# Patient Record
Sex: Female | Born: 1935
Health system: Southern US, Community
[De-identification: ages and names within clinical notes are randomized; demographics above are authoritative.]

## PROBLEM LIST (undated history)

## (undated) DIAGNOSIS — E079 Disorder of thyroid, unspecified: Secondary | ICD-10-CM

## (undated) DIAGNOSIS — E7800 Pure hypercholesterolemia, unspecified: Secondary | ICD-10-CM

## (undated) DIAGNOSIS — R634 Abnormal weight loss: Secondary | ICD-10-CM

## (undated) DIAGNOSIS — M810 Age-related osteoporosis without current pathological fracture: Secondary | ICD-10-CM

## (undated) DIAGNOSIS — C801 Malignant (primary) neoplasm, unspecified: Secondary | ICD-10-CM

## (undated) DIAGNOSIS — L719 Rosacea, unspecified: Secondary | ICD-10-CM

## (undated) DIAGNOSIS — I10 Essential (primary) hypertension: Secondary | ICD-10-CM

## (undated) DIAGNOSIS — F419 Anxiety disorder, unspecified: Secondary | ICD-10-CM

## (undated) HISTORY — DX: Pure hypercholesterolemia, unspecified: E78.00

## (undated) HISTORY — PX: TONSILLECTOMY AND ADENOIDECTOMY: SUR1326

## (undated) HISTORY — DX: Essential (primary) hypertension: I10

## (undated) HISTORY — DX: Rosacea, unspecified: L71.9

## (undated) HISTORY — DX: Anxiety disorder, unspecified: F41.9

## (undated) HISTORY — DX: Age-related osteoporosis without current pathological fracture: M81.0

## (undated) HISTORY — DX: Disorder of thyroid, unspecified: E07.9

## (undated) HISTORY — DX: Abnormal weight loss: R63.4

---
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
## 1957-03-18 HISTORY — PX: CHOLECYSTECTOMY: SHX55

## 2001-03-18 HISTORY — PX: THYROIDECTOMY: SHX17

## 2005-01-09 ENCOUNTER — Encounter: Admission: RE | Admit: 2005-01-09 | Discharge: 2005-01-09 | Payer: Self-pay | Admitting: Family Medicine

## 2006-02-05 ENCOUNTER — Encounter: Admission: RE | Admit: 2006-02-05 | Discharge: 2006-02-05 | Payer: Self-pay | Admitting: Family Medicine

## 2006-02-14 ENCOUNTER — Encounter: Admission: RE | Admit: 2006-02-14 | Discharge: 2006-02-14 | Payer: Self-pay | Admitting: Family Medicine

## 2006-07-21 ENCOUNTER — Encounter: Admission: RE | Admit: 2006-07-21 | Discharge: 2006-07-21 | Payer: Self-pay | Admitting: Family Medicine

## 2007-02-10 ENCOUNTER — Encounter: Admission: RE | Admit: 2007-02-10 | Discharge: 2007-02-10 | Payer: Self-pay | Admitting: Family Medicine

## 2008-02-15 ENCOUNTER — Encounter: Admission: RE | Admit: 2008-02-15 | Discharge: 2008-02-15 | Payer: Self-pay | Admitting: Family Medicine

## 2009-02-15 ENCOUNTER — Encounter: Admission: RE | Admit: 2009-02-15 | Discharge: 2009-02-15 | Payer: Self-pay | Admitting: Family Medicine

## 2009-11-22 ENCOUNTER — Inpatient Hospital Stay (HOSPITAL_COMMUNITY): Admission: EM | Admit: 2009-11-22 | Discharge: 2009-11-24 | Payer: Self-pay | Admitting: Emergency Medicine

## 2009-11-23 ENCOUNTER — Encounter: Payer: Self-pay | Admitting: Cardiology

## 2009-11-23 ENCOUNTER — Encounter (INDEPENDENT_AMBULATORY_CARE_PROVIDER_SITE_OTHER): Payer: Self-pay | Admitting: Internal Medicine

## 2009-11-23 ENCOUNTER — Ambulatory Visit: Payer: Self-pay | Admitting: Surgery

## 2009-11-24 ENCOUNTER — Encounter (INDEPENDENT_AMBULATORY_CARE_PROVIDER_SITE_OTHER): Payer: Self-pay | Admitting: Internal Medicine

## 2010-02-16 ENCOUNTER — Encounter: Admission: RE | Admit: 2010-02-16 | Discharge: 2010-02-16 | Payer: Self-pay | Admitting: Family Medicine

## 2010-05-31 LAB — CBC
HCT: 34.4 % — ABNORMAL LOW (ref 36.0–46.0)
HCT: 41.9 % (ref 36.0–46.0)
Hemoglobin: 11.9 g/dL — ABNORMAL LOW (ref 12.0–15.0)
Hemoglobin: 14.4 g/dL (ref 12.0–15.0)
MCH: 32.9 pg (ref 26.0–34.0)
MCH: 32.9 pg (ref 26.0–34.0)
MCHC: 34.4 g/dL (ref 30.0–36.0)
MCHC: 34.6 g/dL (ref 30.0–36.0)
MCV: 95.2 fL (ref 78.0–100.0)
MCV: 95.7 fL (ref 78.0–100.0)
Platelets: 150 10*3/uL (ref 150–400)
Platelets: 215 10*3/uL (ref 150–400)
RBC: 3.61 MIL/uL — ABNORMAL LOW (ref 3.87–5.11)
RBC: 4.38 MIL/uL (ref 3.87–5.11)
RDW: 13.8 % (ref 11.5–15.5)
RDW: 14.4 % (ref 11.5–15.5)
WBC: 10.6 10*3/uL — ABNORMAL HIGH (ref 4.0–10.5)
WBC: 7.5 10*3/uL (ref 4.0–10.5)

## 2010-05-31 LAB — BASIC METABOLIC PANEL
BUN: 22 mg/dL (ref 6–23)
BUN: 27 mg/dL — ABNORMAL HIGH (ref 6–23)
CO2: 28 mEq/L (ref 19–32)
CO2: 31 mEq/L (ref 19–32)
Calcium: 7.7 mg/dL — ABNORMAL LOW (ref 8.4–10.5)
Calcium: 9.2 mg/dL (ref 8.4–10.5)
Chloride: 106 mEq/L (ref 96–112)
Chloride: 108 mEq/L (ref 96–112)
Creatinine, Ser: 1.25 mg/dL — ABNORMAL HIGH (ref 0.4–1.2)
Creatinine, Ser: 1.38 mg/dL — ABNORMAL HIGH (ref 0.4–1.2)
GFR calc Af Amer: 44 mL/min — ABNORMAL LOW (ref >60)
GFR calc Af Amer: 50 mL/min — ABNORMAL LOW (ref >60)
GFR calc non Af Amer: 37 mL/min — ABNORMAL LOW (ref >60)
GFR calc non Af Amer: 41 mL/min — ABNORMAL LOW (ref >60)
Glucose, Bld: 110 mg/dL — ABNORMAL HIGH (ref 70–99)
Glucose, Bld: 87 mg/dL (ref 70–99)
Potassium: 3.3 mEq/L — ABNORMAL LOW (ref 3.5–5.1)
Potassium: 3.6 mEq/L (ref 3.5–5.1)
Sodium: 141 mEq/L (ref 135–145)
Sodium: 145 mEq/L (ref 135–145)

## 2010-05-31 LAB — CARDIAC PANEL(CRET KIN+CKTOT+MB+TROPI)
CK, MB: 2.4 ng/mL (ref 0.3–4.0)
CK, MB: 2.4 ng/mL (ref 0.3–4.0)
CK, MB: 3.2 ng/mL (ref 0.3–4.0)
Relative Index: 2.3 (ref 0.0–2.5)
Relative Index: 2.3 (ref 0.0–2.5)
Relative Index: INVALID (ref 0.0–2.5)
Total CK: 103 U/L (ref 7–177)
Total CK: 141 U/L (ref 7–177)
Total CK: 99 U/L (ref 7–177)
Troponin I: 0.02 ng/mL (ref 0.00–0.06)
Troponin I: 0.02 ng/mL (ref 0.00–0.06)
Troponin I: 0.02 ng/mL (ref 0.00–0.06)

## 2010-05-31 LAB — LIPID PANEL
Cholesterol: 142 mg/dL (ref 0–200)
HDL: 45 mg/dL (ref >39)
LDL Cholesterol: 80 mg/dL (ref 0–99)
Total CHOL/HDL Ratio: 3.2 RATIO
Triglycerides: 86 mg/dL (ref <150)
VLDL: 17 mg/dL (ref 0–40)

## 2010-05-31 LAB — GLUCOSE, CAPILLARY: Glucose-Capillary: 144 mg/dL — ABNORMAL HIGH (ref 70–99)

## 2010-05-31 LAB — DIFFERENTIAL
Basophils Absolute: 0 10*3/uL (ref 0.0–0.1)
Basophils Relative: 0 % (ref 0–1)
Eosinophils Absolute: 0 10*3/uL (ref 0.0–0.7)
Eosinophils Relative: 1 % (ref 0–5)
Lymphocytes Relative: 11 % — ABNORMAL LOW (ref 12–46)
Lymphs Abs: 1.1 10*3/uL (ref 0.7–4.0)
Monocytes Absolute: 0.6 10*3/uL (ref 0.1–1.0)
Monocytes Relative: 6 % (ref 3–12)
Neutro Abs: 8.7 10*3/uL — ABNORMAL HIGH (ref 1.7–7.7)
Neutrophils Relative %: 83 % — ABNORMAL HIGH (ref 43–77)

## 2010-05-31 LAB — POCT CARDIAC MARKERS
CKMB, poc: 1.1 ng/mL (ref 1.0–8.0)
Myoglobin, poc: 165 ng/mL (ref 12–200)
Troponin i, poc: <0.05 ng/mL (ref 0.00–0.09)

## 2010-05-31 LAB — HEMOGLOBIN A1C
Hgb A1c MFr Bld: 5.7 % — ABNORMAL HIGH (ref <5.7)
Mean Plasma Glucose: 117 mg/dL — ABNORMAL HIGH (ref <117)

## 2011-01-22 ENCOUNTER — Other Ambulatory Visit: Payer: Self-pay | Admitting: Family Medicine

## 2011-01-22 DIAGNOSIS — Z1231 Encounter for screening mammogram for malignant neoplasm of breast: Secondary | ICD-10-CM

## 2011-02-19 ENCOUNTER — Ambulatory Visit
Admission: RE | Admit: 2011-02-19 | Discharge: 2011-02-19 | Disposition: A | Discharge status: Home / Self Care | Payer: Medicare Other | Source: Ambulatory Visit | Attending: Family Medicine | Admitting: Family Medicine

## 2011-02-19 DIAGNOSIS — Z1231 Encounter for screening mammogram for malignant neoplasm of breast: Secondary | ICD-10-CM

## 2012-01-14 ENCOUNTER — Other Ambulatory Visit: Payer: Self-pay | Admitting: Family Medicine

## 2012-01-14 DIAGNOSIS — Z1231 Encounter for screening mammogram for malignant neoplasm of breast: Secondary | ICD-10-CM

## 2012-02-20 ENCOUNTER — Ambulatory Visit
Admission: RE | Admit: 2012-02-20 | Discharge: 2012-02-20 | Disposition: A | Discharge status: Home / Self Care | Payer: Medicare Other | Source: Ambulatory Visit | Attending: Family Medicine | Admitting: Family Medicine

## 2012-02-20 DIAGNOSIS — Z1231 Encounter for screening mammogram for malignant neoplasm of breast: Secondary | ICD-10-CM

## 2013-01-20 ENCOUNTER — Other Ambulatory Visit: Payer: Self-pay

## 2013-01-20 DIAGNOSIS — Z1231 Encounter for screening mammogram for malignant neoplasm of breast: Secondary | ICD-10-CM

## 2013-02-22 ENCOUNTER — Ambulatory Visit: Payer: Medicare Other

## 2013-02-23 ENCOUNTER — Ambulatory Visit
Admission: RE | Admit: 2013-02-23 | Discharge: 2013-02-23 | Disposition: A | Discharge status: Home / Self Care | Payer: Medicare Other | Source: Ambulatory Visit

## 2013-02-23 DIAGNOSIS — Z1231 Encounter for screening mammogram for malignant neoplasm of breast: Secondary | ICD-10-CM

## 2013-07-22 ENCOUNTER — Other Ambulatory Visit: Payer: Self-pay | Admitting: Gastroenterology

## 2013-07-22 DIAGNOSIS — R109 Unspecified abdominal pain: Secondary | ICD-10-CM

## 2013-07-27 ENCOUNTER — Ambulatory Visit
Admission: RE | Admit: 2013-07-27 | Discharge: 2013-07-27 | Disposition: A | Discharge status: Home / Self Care | Payer: Medicare Other | Source: Ambulatory Visit | Attending: Gastroenterology | Admitting: Gastroenterology

## 2013-07-27 ENCOUNTER — Other Ambulatory Visit: Payer: Self-pay | Admitting: Gastroenterology

## 2013-07-27 DIAGNOSIS — R109 Unspecified abdominal pain: Secondary | ICD-10-CM

## 2013-07-27 MED ORDER — IOHEXOL 300 MG/ML  SOLN
100.0000 mL | Freq: Once | INTRAMUSCULAR | Status: AC | PRN
  Administered 2013-07-27: 100 mL via INTRAVENOUS

## 2013-11-26 ENCOUNTER — Encounter: Payer: Self-pay | Admitting: *Deleted

## 2014-01-18 ENCOUNTER — Other Ambulatory Visit: Payer: Self-pay

## 2014-01-18 DIAGNOSIS — Z1231 Encounter for screening mammogram for malignant neoplasm of breast: Secondary | ICD-10-CM

## 2014-02-12 ENCOUNTER — Emergency Department (HOSPITAL_COMMUNITY): Payer: Medicare Other

## 2014-02-12 ENCOUNTER — Emergency Department (HOSPITAL_COMMUNITY)
Admission: EM | Admit: 2014-02-12 | Discharge: 2014-02-12 | Disposition: A | Discharge status: Home / Self Care | Payer: Medicare Other | Attending: Emergency Medicine | Admitting: Emergency Medicine

## 2014-02-12 ENCOUNTER — Encounter (HOSPITAL_COMMUNITY): Payer: Self-pay | Admitting: *Deleted

## 2014-02-12 DIAGNOSIS — I129 Hypertensive chronic kidney disease with stage 1 through stage 4 chronic kidney disease, or unspecified chronic kidney disease: Secondary | ICD-10-CM | POA: Diagnosis not present

## 2014-02-12 DIAGNOSIS — R630 Anorexia: Secondary | ICD-10-CM | POA: Insufficient documentation

## 2014-02-12 DIAGNOSIS — M810 Age-related osteoporosis without current pathological fracture: Secondary | ICD-10-CM | POA: Diagnosis not present

## 2014-02-12 DIAGNOSIS — R110 Nausea: Secondary | ICD-10-CM | POA: Insufficient documentation

## 2014-02-12 DIAGNOSIS — Z872 Personal history of diseases of the skin and subcutaneous tissue: Secondary | ICD-10-CM | POA: Insufficient documentation

## 2014-02-12 DIAGNOSIS — R109 Unspecified abdominal pain: Secondary | ICD-10-CM | POA: Diagnosis present

## 2014-02-12 DIAGNOSIS — N189 Chronic kidney disease, unspecified: Secondary | ICD-10-CM | POA: Insufficient documentation

## 2014-02-12 DIAGNOSIS — R52 Pain, unspecified: Secondary | ICD-10-CM

## 2014-02-12 DIAGNOSIS — R531 Weakness: Secondary | ICD-10-CM | POA: Diagnosis not present

## 2014-02-12 DIAGNOSIS — E876 Hypokalemia: Secondary | ICD-10-CM | POA: Insufficient documentation

## 2014-02-12 DIAGNOSIS — Z79899 Other long term (current) drug therapy: Secondary | ICD-10-CM | POA: Diagnosis not present

## 2014-02-12 DIAGNOSIS — Z9049 Acquired absence of other specified parts of digestive tract: Secondary | ICD-10-CM | POA: Insufficient documentation

## 2014-02-12 DIAGNOSIS — E079 Disorder of thyroid, unspecified: Secondary | ICD-10-CM | POA: Diagnosis not present

## 2014-02-12 LAB — URINALYSIS, ROUTINE W REFLEX MICROSCOPIC
Bilirubin Urine: NEGATIVE
GLUCOSE, UA: NEGATIVE mg/dL
Ketones, ur: NEGATIVE mg/dL
LEUKOCYTES UA: NEGATIVE
Nitrite: NEGATIVE
Protein, ur: NEGATIVE mg/dL
SPECIFIC GRAVITY, URINE: 1.012 (ref 1.005–1.030)
Urobilinogen, UA: 0.2 mg/dL (ref 0.0–1.0)
pH: 5.5 (ref 5.0–8.0)

## 2014-02-12 LAB — CBC WITH DIFFERENTIAL/PLATELET
BASOS PCT: 0 % (ref 0–1)
Basophils Absolute: 0 10*3/uL (ref 0.0–0.1)
Eosinophils Absolute: 0 10*3/uL (ref 0.0–0.7)
Eosinophils Relative: 0 % (ref 0–5)
HEMATOCRIT: 41.8 % (ref 36.0–46.0)
Hemoglobin: 14.2 g/dL (ref 12.0–15.0)
LYMPHS PCT: 12 % (ref 12–46)
Lymphs Abs: 1.1 10*3/uL (ref 0.7–4.0)
MCH: 32.6 pg (ref 26.0–34.0)
MCHC: 34 g/dL (ref 30.0–36.0)
MCV: 96.1 fL (ref 78.0–100.0)
MONO ABS: 0.8 10*3/uL (ref 0.1–1.0)
Monocytes Relative: 8 % (ref 3–12)
NEUTROS ABS: 7.7 10*3/uL (ref 1.7–7.7)
NEUTROS PCT: 80 % — AB (ref 43–77)
PLATELETS: 165 10*3/uL (ref 150–400)
RBC: 4.35 MIL/uL (ref 3.87–5.11)
RDW: 13.6 % (ref 11.5–15.5)
WBC: 9.6 10*3/uL (ref 4.0–10.5)

## 2014-02-12 LAB — COMPREHENSIVE METABOLIC PANEL
ALBUMIN: 3.8 g/dL (ref 3.5–5.2)
ALK PHOS: 75 U/L (ref 39–117)
ALT: 13 U/L (ref 0–35)
AST: 26 U/L (ref 0–37)
Anion gap: 18 — ABNORMAL HIGH (ref 5–15)
BILIRUBIN TOTAL: 0.8 mg/dL (ref 0.3–1.2)
BUN: 21 mg/dL (ref 6–23)
CHLORIDE: 99 meq/L (ref 96–112)
CO2: 23 meq/L (ref 19–32)
Calcium: 8.7 mg/dL (ref 8.4–10.5)
Creatinine, Ser: 1.2 mg/dL — ABNORMAL HIGH (ref 0.50–1.10)
GFR calc Af Amer: 46 mL/min — ABNORMAL LOW (ref >90)
GFR, EST NON AFRICAN AMERICAN: 39 mL/min — AB (ref >90)
Glucose, Bld: 110 mg/dL — ABNORMAL HIGH (ref 70–99)
POTASSIUM: 3.5 meq/L — AB (ref 3.7–5.3)
SODIUM: 140 meq/L (ref 137–147)
Total Protein: 7.5 g/dL (ref 6.0–8.3)

## 2014-02-12 LAB — I-STAT TROPONIN, ED: Troponin i, poc: 0 ng/mL (ref 0.00–0.08)

## 2014-02-12 LAB — URINE MICROSCOPIC-ADD ON

## 2014-02-12 LAB — LIPASE, BLOOD: LIPASE: 40 U/L (ref 11–59)

## 2014-02-12 MED ORDER — IOHEXOL 300 MG/ML  SOLN
50.0000 mL | Freq: Once | INTRAMUSCULAR | Status: AC | PRN
  Administered 2014-02-12: 50 mL via ORAL

## 2014-02-12 MED ORDER — ONDANSETRON HCL 4 MG/2ML IJ SOLN: 4.0000 mg | Freq: Once | INTRAMUSCULAR | Status: DC

## 2014-02-12 MED ORDER — IOHEXOL 300 MG/ML  SOLN
80.0000 mL | Freq: Once | INTRAMUSCULAR | Status: AC | PRN
  Administered 2014-02-12: 80 mL via INTRAVENOUS

## 2014-02-12 MED ORDER — ONDANSETRON HCL 8 MG PO TABS: 8.0000 mg | ORAL_TABLET | Freq: Three times a day (TID) | ORAL | Status: DC | PRN

## 2014-02-12 MED ORDER — POTASSIUM CHLORIDE CRYS ER 20 MEQ PO TBCR
40.0000 meq | EXTENDED_RELEASE_TABLET | Freq: Once | ORAL | Status: AC
  Administered 2014-02-12: 40 meq via ORAL
  Filled 2014-02-12: qty 2

## 2014-02-12 MED ORDER — SODIUM CHLORIDE 0.9 % IV BOLUS (SEPSIS)
1000.0000 mL | Freq: Once | INTRAVENOUS | Status: AC
  Administered 2014-02-12: 1000 mL via INTRAVENOUS

## 2014-02-12 NOTE — ED Notes (Signed)
Pt ambulated to restroom with steady gait.

## 2014-02-12 NOTE — ED Notes (Signed)
Pt denies pain or nausea presently.

## 2014-02-12 NOTE — Discharge Instructions (Signed)
Abdominal Pain Take the medication prescribed as needed for nausea. Call Dr. Wynetta Emery to schedule next available office appointment. You can continue the omeprazole as prescribed. You can discuss with Dr. Laurann Montana whether or not you need to continue sertraline for anxiety or stress Many things can cause abdominal pain. Usually, abdominal pain is not caused by a disease and will improve without treatment. It can often be observed and treated at home. Your health care provider will do a physical exam and possibly order blood tests and X-rays to help determine the seriousness of your pain. However, in many cases, more time must pass before a clear cause of the pain can be found. Before that point, your health care provider may not know if you need more testing or further treatment. HOME CARE INSTRUCTIONS  Monitor your abdominal pain for any changes. The following actions may help to alleviate any discomfort you are experiencing:  Only take over-the-counter or prescription medicines as directed by your health care provider.  Do not take laxatives unless directed to do so by your health care provider.  Try a clear liquid diet (broth, tea, or water) as directed by your health care provider. Slowly move to a bland diet as tolerated. SEEK MEDICAL CARE IF:  You have unexplained abdominal pain.  You have abdominal pain associated with nausea or diarrhea.  You have pain when you urinate or have a bowel movement.  You experience abdominal pain that wakes you in the night.  You have abdominal pain that is worsened or improved by eating food.  You have abdominal pain that is worsened with eating fatty foods.  You have a fever. SEEK IMMEDIATE MEDICAL CARE IF:   Your pain does not go away within 2 hours.  You keep throwing up (vomiting).  Your pain is felt only in portions of the abdomen, such as the right side or the left lower portion of the  abdomen.  You pass bloody or black tarry stools. MAKE SURE YOU:  Understand these instructions.   Will watch your condition.   Will get help right away if you are not doing well or get worse.  Document Released: 12/12/2004 Document Revised: 03/09/2013 Document Reviewed: 11/11/2012 [facility] Patient Information 2015 [place], [place]. This information is not intended to replace advice given to you by your health care provider. Make sure you discuss any questions you have with your health care provider.

## 2014-02-12 NOTE — ED Notes (Signed)
Pt leaving with ALL belongings she arrived with. Patient was advised to follow up with GI and PCP.

## 2014-02-12 NOTE — ED Notes (Signed)
Pt aware of the need for a urine sample however, is unable to void at this time.

## 2014-02-12 NOTE — ED Notes (Addendum)
Pt reports that she has had intermittent nausea and centralized abdominal pain x 1 month. Pt had episode this am in which she took pepto bismal with relief. Was seen at PCP this past week and was prescribed Zoloft and Omeprazole.

## 2014-02-12 NOTE — ED Provider Notes (Addendum)
CSN: [id number]     Arrival date & time 02/12/14  [id number] History   First MD Initiated Contact with Patient 02/12/14 0747     Chief Complaint  Patient presents with  . Nausea  . Abdominal Pain     (Consider location/radiation/quality/duration/timing/severity/associated sxs/prior Treatment) HPI Complains of vague abdominal discomfort, and hypogastrium, nonradiating onset 1 month ago. Accompanying symptoms include mild generalized weakness, diminished appetite and nausea no fever. Last bowel movement was 5 days ago, diarrhea. No urinary symptoms. She saw her primary care physician Dr. Lady Deutscher this past week was prescribed sertraline and subsequently prescribed omeprazole over the telephone which she's taken, without relief. No fever no other associated symptoms symptoms are nonexertional she denies chest pain. She presently states "I don't feel too bad" Past Medical History  Diagnosis Date  . Thyroid disease   . Hypertension   . Osteoporosis   . Rosacea    hypercholesterolemia Past Surgical History  Procedure Laterality Date  . Thyroidectomy    . Cholecystectomy    . Tonsillectomy and adenoidectomy     Family History  Problem Relation Age of Onset  . Hypertension Mother    History  Substance Use Topics  . Smoking status: Never Smoker   . Smokeless tobacco: Not on file  . Alcohol Use: No   OB History    No data available     Review of Systems  HENT: Negative.   Respiratory: Negative.   Cardiovascular: Negative.   Gastrointestinal: Positive for nausea and abdominal pain.  Musculoskeletal: Negative.   Skin: Negative.   Neurological: Positive for weakness.  Psychiatric/Behavioral: Negative.   All other systems reviewed and are negative.     Allergies  Aspirin  Home Medications   Prior to Admission medications   Medication Sig Start Date End Date Taking? Authorizing Provider  calcium carbonate (TUMS - DOSED IN MG ELEMENTAL CALCIUM) 500 MG chewable tablet Chew 1  tablet by mouth daily.    Historical Provider, MD  levothyroxine (SYNTHROID, LEVOTHROID) 50 MCG tablet Take 50 mcg by mouth daily before breakfast.    Historical Provider, MD  levothyroxine (SYNTHROID, LEVOTHROID) 75 MCG tablet Take 75 mcg by mouth daily before breakfast.    Historical Provider, MD  losartan-hydrochlorothiazide (HYZAAR) 100-12.5 MG per tablet Take 1 tablet by mouth daily.    Historical Provider, MD  Multiple Vitamin (MULTIVITAMINS PO) Take by mouth.    Historical Provider, MD   BP 141/92 mmHg  Pulse 106  Temp(Src) 97.8 F (36.6 C) (Oral)  Resp 16  Ht 5\' 7"  (1.702 m)  Wt 125 lb (56.7 kg)  BMI 19.57 kg/m2  SpO2 95% Physical Exam  Constitutional: She appears well-developed and well-nourished.  HENT:  Head: Normocephalic and atraumatic.  Eyes: Conjunctivae are normal. Pupils are equal, round, and reactive to light.  Neck: Neck supple. No tracheal deviation present. No thyromegaly present.  Cardiovascular: Normal rate and regular rhythm.   No murmur heard. Pulmonary/Chest: Effort normal and breath sounds normal.  Abdominal: Soft. Bowel sounds are normal. She exhibits no distension. There is no tenderness.  Musculoskeletal: Normal range of motion. She exhibits no edema or tenderness.  Neurological: She is alert. Coordination normal.  Skin: Skin is warm and dry. No rash noted.  Psychiatric: She has a normal mood and affect.  Nursing note and vitals reviewed.   ED Course  Procedures (including critical care time) Labs Review Labs Reviewed  CBC WITH DIFFERENTIAL  COMPREHENSIVE METABOLIC PANEL  LIPASE, BLOOD  URINALYSIS, ROUTINE W REFLEX  MICROSCOPIC  I-STAT TROPOININ, ED    Imaging Review No results found.   EKG Interpretation None     11:40 PM patient asymptomatic. Results for orders placed or performed during the hospital encounter of 02/12/14  CBC with Differential  Result Value Ref Range   WBC 9.6 4.0 - 10.5 K/uL   RBC 4.35 3.87 - 5.11 MIL/uL    Hemoglobin 14.2 12.0 - 15.0 g/dL   HCT 41.8 36.0 - 46.0 %   MCV 96.1 78.0 - 100.0 fL   MCH 32.6 26.0 - 34.0 pg   MCHC 34.0 30.0 - 36.0 g/dL   RDW 13.6 11.5 - 15.5 %   Platelets 165 150 - 400 K/uL   Neutrophils Relative % 80 (H) 43 - 77 %   Neutro Abs 7.7 1.7 - 7.7 K/uL   Lymphocytes Relative 12 12 - 46 %   Lymphs Abs 1.1 0.7 - 4.0 K/uL   Monocytes Relative 8 3 - 12 %   Monocytes Absolute 0.8 0.1 - 1.0 K/uL   Eosinophils Relative 0 0 - 5 %   Eosinophils Absolute 0.0 0.0 - 0.7 K/uL   Basophils Relative 0 0 - 1 %   Basophils Absolute 0.0 0.0 - 0.1 K/uL  Comprehensive metabolic panel  Result Value Ref Range   Sodium 140 137 - 147 mEq/L   Potassium 3.5 (L) 3.7 - 5.3 mEq/L   Chloride 99 96 - 112 mEq/L   CO2 23 19 - 32 mEq/L   Glucose, Bld 110 (H) 70 - 99 mg/dL   BUN 21 6 - 23 mg/dL   Creatinine, Ser 1.20 (H) 0.50 - 1.10 mg/dL   Calcium 8.7 8.4 - 10.5 mg/dL   Total Protein 7.5 6.0 - 8.3 g/dL   Albumin 3.8 3.5 - 5.2 g/dL   AST 26 0 - 37 U/L   ALT 13 0 - 35 U/L   Alkaline Phosphatase 75 39 - 117 U/L   Total Bilirubin 0.8 0.3 - 1.2 mg/dL   GFR calc non Af Amer 39 (L) >90 mL/min   GFR calc Af Amer 46 (L) >90 mL/min   Anion gap 18 (H) 5 - 15  Lipase, blood  Result Value Ref Range   Lipase 40 11 - 59 U/L  Urinalysis, Routine w reflex microscopic  Result Value Ref Range   Color, Urine YELLOW YELLOW   APPearance CLOUDY (A) CLEAR   Specific Gravity, Urine 1.012 1.005 - 1.030   pH 5.5 5.0 - 8.0   Glucose, UA NEGATIVE NEGATIVE mg/dL   Hgb urine dipstick TRACE (A) NEGATIVE   Bilirubin Urine NEGATIVE NEGATIVE   Ketones, ur NEGATIVE NEGATIVE mg/dL   Protein, ur NEGATIVE NEGATIVE mg/dL   Urobilinogen, UA 0.2 0.0 - 1.0 mg/dL   Nitrite NEGATIVE NEGATIVE   Leukocytes, UA NEGATIVE NEGATIVE  Urine microscopic-add on  Result Value Ref Range   Squamous Epithelial / LPF RARE RARE   WBC, UA 0-2 <3 WBC/hpf   Casts HYALINE CASTS (A) NEGATIVE  I-stat troponin, ED (only if pt is 80 y.o. or  older & pain is above umbilicus) - do not order at [facility]  Result Value Ref Range   Troponin i, poc 0.00 0.00 - 0.08 ng/mL   Comment 3           Ct Abdomen Pelvis W Contrast  02/12/2014   CLINICAL DATA:  80 year old with mid abdominal pain. Nausea and decreased appetite for 1 month.  EXAM: CT ABDOMEN AND PELVIS WITH CONTRAST  TECHNIQUE: Multidetector  CT imaging of the abdomen and pelvis was performed using the standard protocol following bolus administration of intravenous contrast.  CONTRAST:  56mL OMNIPAQUE IOHEXOL 300 MG/ML SOLN, 5mL OMNIPAQUE IOHEXOL 300 MG/ML SOLN  COMPARISON:  CT 07/27/2013  FINDINGS: Lung bases are clear.  Negative for free air.  Post cholecystectomy with mild biliary dilatation. Biliary dilatation is unchanged. No acute abnormalities in the liver. The portal venous system is patent. Normal appearance of the a pancreas, spleen and adrenal glands. Evidence for bilateral renal cysts. Evidence for an extrarenal pelvis in both kidneys without hydronephrosis.  There is a stable oval-shaped low density structure in the right adnexa that measures up to 2.4 cm. Findings could represent peritoneal inclusion cyst. No evidence for free fluid. Normal appearance of the uterus and left adnexal tissue. Moderate distention of the urinary bladder.  The abdominal aorta is heavily calcified without aneurysm. No significant abdominal or pelvic lymphadenopathy. There is colonic diverticulosis but no evidence for acute colonic inflammation. Appendix has a normal appearance. No evidence for bowel dilatation or obstruction.  No acute bone abnormality.  Disc space loss at L4-L5.  IMPRESSION: No acute abnormality within the abdomen or pelvis.  Stable 2.4 cm low-density structure in the region of the right adnexa. Findings could represent an adnexal cyst or peritoneal inclusion cyst. Minimal change from the previous examination. This could be followed up in 1 year with an ultrasound to ensure stability.  Bilateral  renal cysts.   Electronically Signed   By: Markus Daft M.D.   On: 02/12/2014 10:30    MDM  Mild hypokalemia is chronic. Renal insufficiency is chronic Plan prescription Zofran. No obvious etiology of symptoms Symptoms appear to be functional plan follow-up with Dr. Laurann Montana or she can schedule appointment with Dr. Wynetta Emery, gastroenterologist whom she is seen in the past Diagnosis #1 nonspecific abdominal pain #2 hypokalemia #3 renal insufficiency Final diagnoses:  None        Orlie Dakin, MD 02/12/14 [place], MD 02/12/14 1220

## 2014-02-12 NOTE — ED Notes (Signed)
MD Jacubowitz at bedside.  

## 2014-02-12 NOTE — ED Notes (Signed)
Patient stated that she cant pee, will notify when ready. Wants fluids first.

## 2014-02-12 NOTE — ED Notes (Signed)
Pt transported to CT ?

## 2014-02-12 NOTE — ED Notes (Signed)
Pt sts for about a month she has been having abdominal discomfort and nausea. Pt sts also no appetite and not drinking a lot. Pt reports pain is in her mid abdomen

## 2014-02-12 NOTE — ED Notes (Signed)
MD at bedside. 

## 2014-02-20 ENCOUNTER — Encounter (HOSPITAL_COMMUNITY): Payer: Self-pay | Admitting: Emergency Medicine

## 2014-02-20 ENCOUNTER — Emergency Department (HOSPITAL_COMMUNITY)
Admission: EM | Admit: 2014-02-20 | Discharge: 2014-02-20 | Disposition: A | Discharge status: Home / Self Care | Payer: Medicare Other | Attending: Emergency Medicine | Admitting: Emergency Medicine

## 2014-02-20 DIAGNOSIS — R1013 Epigastric pain: Secondary | ICD-10-CM | POA: Insufficient documentation

## 2014-02-20 DIAGNOSIS — Z872 Personal history of diseases of the skin and subcutaneous tissue: Secondary | ICD-10-CM | POA: Diagnosis not present

## 2014-02-20 DIAGNOSIS — Z9049 Acquired absence of other specified parts of digestive tract: Secondary | ICD-10-CM | POA: Insufficient documentation

## 2014-02-20 DIAGNOSIS — Z79899 Other long term (current) drug therapy: Secondary | ICD-10-CM | POA: Insufficient documentation

## 2014-02-20 DIAGNOSIS — I10 Essential (primary) hypertension: Secondary | ICD-10-CM | POA: Diagnosis not present

## 2014-02-20 DIAGNOSIS — M810 Age-related osteoporosis without current pathological fracture: Secondary | ICD-10-CM | POA: Diagnosis not present

## 2014-02-20 DIAGNOSIS — E079 Disorder of thyroid, unspecified: Secondary | ICD-10-CM | POA: Diagnosis not present

## 2014-02-20 MED ORDER — ONDANSETRON HCL 4 MG/2ML IJ SOLN
4.0000 mg | Freq: Once | INTRAMUSCULAR | Status: DC
  Filled 2014-02-20: qty 2

## 2014-02-20 MED ORDER — FAMOTIDINE IN NACL 20-0.9 MG/50ML-% IV SOLN
20.0000 mg | Freq: Once | INTRAVENOUS | Status: DC
  Filled 2014-02-20: qty 50

## 2014-02-20 MED ORDER — SODIUM CHLORIDE 0.9 % IV SOLN: Freq: Once | INTRAVENOUS | Status: DC

## 2014-02-20 MED ORDER — RANITIDINE HCL 150 MG PO TABS: 150.0000 mg | ORAL_TABLET | Freq: Two times a day (BID) | ORAL | Status: DC

## 2014-02-20 MED ORDER — SUCRALFATE 1 GM/10ML PO SUSP: 1.0000 g | Freq: Three times a day (TID) | ORAL | Status: DC

## 2014-02-20 MED ORDER — DICYCLOMINE HCL 20 MG PO TABS: 20.0000 mg | ORAL_TABLET | Freq: Two times a day (BID) | ORAL | Status: DC

## 2014-02-20 MED ORDER — GI COCKTAIL ~~LOC~~
30.0000 mL | Freq: Once | ORAL | Status: DC
  Filled 2014-02-20: qty 30

## 2014-02-20 NOTE — ED Provider Notes (Signed)
CSN: [id number]     Arrival date & time 02/20/14  [id number] History   First MD Initiated Contact with Patient 02/20/14 [phone]     Chief Complaint  Patient presents with  . Abdominal Pain     (Consider location/radiation/quality/duration/timing/severity/associated sxs/prior Treatment) HPI Comments: Patient presents to the ER for further evaluation of epigastric discomfort. Patient reports that the symptoms began approximately a month ago. She was seen in the ER a week ago and had a workup. She was supposed to follow-up with gastroenterology. She had an appointment on Thursday, but felt better and canceled the appointment. Yesterday she reports significant worsening of the pain. She has had nausea and can't eat. There has not been any vomiting. No rectal bleeding or melena.  Patient reports that she has not feeling uncomfortable today. She took the omeprazole and seems to have improved significantly. No discomfort currently. Daughter asks "can't you just get her admitted to get this taken care of?"  Patient is a 80 y.o. female presenting with abdominal pain.  Abdominal Pain Associated symptoms: nausea   Associated symptoms: no diarrhea and no vomiting     Past Medical History  Diagnosis Date  . Thyroid disease   . Hypertension   . Osteoporosis   . Rosacea    Past Surgical History  Procedure Laterality Date  . Thyroidectomy    . Cholecystectomy    . Tonsillectomy and adenoidectomy     Family History  Problem Relation Age of Onset  . Hypertension Mother    History  Substance Use Topics  . Smoking status: Never Smoker   . Smokeless tobacco: Not on file  . Alcohol Use: No   OB History    No data available     Review of Systems  Gastrointestinal: Positive for nausea and abdominal pain. Negative for vomiting and diarrhea.  All other systems reviewed and are negative.     Allergies  Aspirin  Home Medications   Prior to Admission medications   Medication Sig Start Date End  Date Taking? Authorizing Provider  ALPRAZolam (XANAX) 0.25 MG tablet Take 0.25 mg by mouth 2 (two) times daily as needed for anxiety.  02/14/14   Historical Provider, MD  atorvastatin (LIPITOR) 10 MG tablet Take 10 mg by mouth daily.    Historical Provider, MD  bismuth subsalicylate (PEPTO BISMOL) 262 MG/15ML suspension Take 30 mLs by mouth every 6 (six) hours as needed for indigestion or diarrhea or loose stools.    Historical Provider, MD  calcium carbonate (TUMS - DOSED IN MG ELEMENTAL CALCIUM) 500 MG chewable tablet Chew 1 tablet by mouth daily.    Historical Provider, MD  levothyroxine (SYNTHROID, LEVOTHROID) 50 MCG tablet Take 50 mcg by mouth daily before breakfast. Saturday and sunday    Historical Provider, MD  levothyroxine (SYNTHROID, LEVOTHROID) 75 MCG tablet Take 75 mcg by mouth daily before breakfast. Monday-friday    Historical Provider, MD  losartan-hydrochlorothiazide (HYZAAR) 100-12.5 MG per tablet Take 1 tablet by mouth daily.    Historical Provider, MD  Multiple Vitamin (MULTIVITAMIN WITH MINERALS) TABS tablet Take 1 tablet by mouth daily.    Historical Provider, MD  omeprazole (PRILOSEC) 20 MG capsule Take 20 mg by mouth daily.    Historical Provider, MD  ondansetron (ZOFRAN) 8 MG tablet Take 1 tablet (8 mg total) by mouth every 8 (eight) hours as needed for nausea. 02/12/14   Orlie Dakin, MD  sertraline (ZOLOFT) 25 MG tablet Take 25 mg by mouth daily.    Historical  Provider, MD   BP 154/80 mmHg  Pulse 100  Temp(Src) 98.5 F (36.9 C) (Oral)  Resp 18  SpO2 100% Physical Exam  Constitutional: She is oriented to person, place, and time. She appears well-developed and well-nourished. No distress.  HENT:  Head: Normocephalic and atraumatic.  Right Ear: Hearing normal.  Left Ear: Hearing normal.  Nose: Nose normal.  Mouth/Throat: Oropharynx is clear and moist and mucous membranes are normal.  Eyes: Conjunctivae and EOM are normal. Pupils are equal, round, and reactive to  light.  Neck: Normal range of motion. Neck supple.  Cardiovascular: Regular rhythm, S1 normal and S2 normal.  Exam reveals no gallop and no friction rub.   No murmur heard. Pulmonary/Chest: Effort normal and breath sounds normal. No respiratory distress. She exhibits no tenderness.  Abdominal: Soft. Normal appearance and bowel sounds are normal. There is no hepatosplenomegaly. There is no tenderness. There is no rebound, no guarding, no tenderness at McBurney's point and negative Murphy's sign. No hernia.  Musculoskeletal: Normal range of motion.  Neurological: She is alert and oriented to person, place, and time. She has normal strength. No cranial nerve deficit or sensory deficit. Coordination normal. GCS eye subscore is 4. GCS verbal subscore is 5. GCS motor subscore is 6.  Skin: Skin is warm, dry and intact. No rash noted. No cyanosis.  Psychiatric: She has a normal mood and affect. Her speech is normal and behavior is normal. Thought content normal.  Nursing note and vitals reviewed.   ED Course  Procedures (including critical care time) Labs Review Labs Reviewed  CBC WITH DIFFERENTIAL  COMPREHENSIVE METABOLIC PANEL  URINALYSIS, ROUTINE W REFLEX MICROSCOPIC  TROPONIN I  LIPASE, BLOOD    Imaging Review No results found.   EKG Interpretation None      MDM   Final diagnoses:  None   epigastric abdominal pain  Patient with ongoing issues of epigastric discomfort for approximately a month. Patient was seen in the ER week ago. I reviewed that visit. Patient had a thorough workup including CAT scan that did not show any acute abnormalities. She is not having any symptoms currently. She is here asking for admission to get further workup. Patient had appropriate outpatient follow-up scheduled for this past Thursday with her gastroenterologist, but canceled the appointment. I informed her that I would not be admitting her today unless I found any significant changes in her blood  work. Her examination is unremarkable. I did recommend repeat evaluation of her because she had some mild left-sided abnormalities that are chronic. After she found out that she was not going to be admitted or get her full workup here in the ER including GI consultation, she decided she did not want any workup performed. Patient will be discharged. Will prescribe ranitidine in addition to the omeprazole. Add Carafate. Continue Zofran as needed.    Orpah Greek, MD 02/20/14 [phone]

## 2014-02-20 NOTE — ED Notes (Signed)
Educated pt and daughter on Rx x3. Both verbalize understanding and agreement with plan of care to start Rxs and f/u with GI this week. Both appreciative of care and plan.

## 2014-02-20 NOTE — Discharge Instructions (Signed)

## 2014-02-20 NOTE — ED Notes (Signed)
Pt c/o mid abd pain since November.  Was just seen a few days ago for the same thing.  Wants to be admitted.  Dr. Betsey Holiday in room explaining treatment plan to follow up with GI.  States that she has was supposed to see her GI on Thursday but cancelled the appt because she was feeling better.

## 2014-02-24 ENCOUNTER — Ambulatory Visit: Payer: Medicare Other

## 2014-03-24 ENCOUNTER — Encounter: Payer: Self-pay | Admitting: Nurse Practitioner

## 2014-03-24 ENCOUNTER — Ambulatory Visit (INDEPENDENT_AMBULATORY_CARE_PROVIDER_SITE_OTHER): Payer: BLUE CROSS/BLUE SHIELD | Admitting: Nurse Practitioner

## 2014-03-24 VITALS — BP 112/66 | HR 89 | Temp 98.1°F | Resp 10 | Ht 65.0 in | Wt 110.0 lb

## 2014-03-24 DIAGNOSIS — I10 Essential (primary) hypertension: Secondary | ICD-10-CM

## 2014-03-24 DIAGNOSIS — E890 Postprocedural hypothyroidism: Secondary | ICD-10-CM

## 2014-03-24 DIAGNOSIS — R634 Abnormal weight loss: Secondary | ICD-10-CM

## 2014-03-24 DIAGNOSIS — F419 Anxiety disorder, unspecified: Secondary | ICD-10-CM

## 2014-03-24 DIAGNOSIS — E7800 Pure hypercholesterolemia, unspecified: Secondary | ICD-10-CM

## 2014-03-24 MED ORDER — MIRTAZAPINE 15 MG PO TABS: 15.0000 mg | ORAL_TABLET | Freq: Every day | ORAL | Status: DC

## 2014-03-24 NOTE — Progress Notes (Signed)
Patient ID: Lauren [name], female   DOB: Jan 05, 1925, 81 y.o.   MRN: [id number]    PCP: [name], NP  Allergies  Allergen Reactions  . Aspirin     Upset stomach     Chief Complaint  Patient presents with  . Establish Care    New patient establish care: Anxious (causing stomach issues), decreased appetitie, and mental concerns      HPI: Patient is a 81 y.o. female seen in the office today to establish care. pts daughter Juliann Pulse is here with pt today. Was not getting results at previous PCP, been to emergency room, gastroenterologist.  Previous PCP Dr [facility]- Dr Earle Gell   Pt with frequent nausea, gnawing feeling not eating Pt reports she feel likes it is nerves in her stomach, no appetite. No vomiting. Lives alone. Takes care of her own bills, cooks, still drives.  Had been volunteering at the hospital for 9-10 years and has stopped doing that due to anxiety  Went to to GI who decreased her zoloft due to nausea side effect. Has not noticed any changes in nausea.  In May she had increase stomach issues and had an endoscopy and it was normal.  Feels like her memory is not well but in the last few days her mind is clearer.  Has lost 16 lbs in 3 months.  Recently with adjustment in synthroid   Review of Systems:  Review of Systems  Constitutional: Positive for activity change and unexpected weight change. Negative for appetite change and fatigue.  HENT: Positive for hearing loss. Negative for congestion.   Eyes: Negative.   Respiratory: Negative for cough and shortness of breath.   Cardiovascular: Positive for palpitations (associated with anxiety). Negative for chest pain and leg swelling.  Gastrointestinal: Positive for nausea and abdominal pain (or discomfort). Negative for vomiting, diarrhea, constipation, blood in stool and abdominal distention.  Genitourinary: Negative for dysuria and difficulty urinating.  Musculoskeletal: Negative for myalgias and  arthralgias.  Skin: Negative for color change and wound.  Neurological: Negative for dizziness, tremors, seizures, weakness, light-headedness and headaches.  Psychiatric/Behavioral: Negative for behavioral problems, confusion and agitation. The patient is nervous/anxious.     Past Medical History  Diagnosis Date  . Thyroid disease   . Hypertension   . Osteoporosis   . Rosacea   . High cholesterol    Past Surgical History  Procedure Laterality Date  . Thyroidectomy  2003    [place]  . Cholecystectomy  1959    [place]  . Tonsillectomy and adenoidectomy     Social History:   reports that she has never smoked. She does not have any smokeless tobacco history on file. She reports that she does not drink alcohol or use illicit drugs.  Family History  Problem Relation Age of Onset  . Hypertension Mother   . Stroke Father   . Stroke Mother   . Lung cancer Daughter     Medications: Patient's Medications  New Prescriptions   No medications on file  Previous Medications   CALCIUM CARBONATE (TUMS EX) 750 MG CHEWABLE TABLET    Chew 1 tablet by mouth as needed for heartburn.   LEVOTHYROXINE (SYNTHROID, LEVOTHROID) 75 MCG TABLET    Take 75 mcg by mouth daily before breakfast.    LOSARTAN-HYDROCHLOROTHIAZIDE (HYZAAR) 100-12.5 MG PER TABLET    Take 1 tablet by mouth daily.   MULTIPLE VITAMIN (MULTIVITAMIN WITH MINERALS) TABS TABLET    Take 1 tablet by mouth daily.  OMEPRAZOLE (PRILOSEC) 20 MG CAPSULE    Take 20 mg by mouth daily.   SERTRALINE (ZOLOFT) 25 MG TABLET    Take 25 mg by mouth daily.  Modified Medications   No medications on file  Discontinued Medications   ATORVASTATIN (LIPITOR) 10 MG TABLET    Take 10 mg by mouth daily.   LEVOTHYROXINE (SYNTHROID, LEVOTHROID) 50 MCG TABLET    Take 50 mcg by mouth daily before breakfast. Saturday and sunday     Physical Exam:  Filed Vitals:   03/24/14 1331  BP: 112/66  Pulse: 89  Temp: 99.2 F (37.3 C)  TempSrc: Oral  Resp: 10    Height: 5\' 5"  (1.651 m)  Weight: 110 lb (49.896 kg)  SpO2: 98%    Physical Exam  Constitutional: She is oriented to person, place, and time. She appears well-developed and well-nourished. No distress.  HENT:  Head: Normocephalic and atraumatic.  Mouth/Throat: Oropharynx is clear and moist. No oropharyngeal exudate.  Eyes: Conjunctivae are normal. Pupils are equal, round, and reactive to light.  Neck: Normal range of motion. Neck supple.  Cardiovascular: Normal rate, regular rhythm and normal heart sounds.   Pulmonary/Chest: Effort normal and breath sounds normal.  Abdominal: Soft. Bowel sounds are normal.  Musculoskeletal: She exhibits no edema or tenderness.  Neurological: She is alert and oriented to person, place, and time.  Skin: Skin is warm and dry. She is not diaphoretic.  Psychiatric: She has a normal mood and affect.    Labs reviewed: Basic Metabolic Panel:  Recent Labs  02/12/14 0817  NA 140  K 3.5*  CL 99  CO2 23  GLUCOSE 110*  BUN 21  CREATININE 1.20*  CALCIUM 8.7   Liver Function Tests:  Recent Labs  02/12/14 0817  AST 26  ALT 13  ALKPHOS 75  BILITOT 0.8  PROT 7.5  ALBUMIN 3.8    Recent Labs  02/12/14 0817  LIPASE 40   No results for input(s): AMMONIA in the last 8760 hours. CBC:  Recent Labs  02/12/14 0817  WBC 9.6  NEUTROABS 7.7  HGB 14.2  HCT 41.8  MCV 96.1  PLT 165   Lipid Panel: No results for input(s): CHOL, HDL, LDLCALC, TRIG, CHOLHDL, LDLDIRECT in the last 8760 hours. TSH: No results for input(s): TSH in the last 8760 hours. A1C: Lab Results  Component Value Date   HGBA1C * 11/23/2009    5.7 (NOTE)                                                                       According to the ADA Clinical Practice Recommendations for 2011, when HbA1c is used as a screening test:   >=6.5%   Diagnostic of Diabetes Mellitus           (if abnormal result  is confirmed)  5.7-6.4%   Increased risk of developing Diabetes Mellitus   References:Diagnosis and Classification of Diabetes Mellitus,Diabetes [id number] 1):S62-S69 and Standards of Medical Care in         Diabetes - 2011,Diabetes [id number]  (Suppl 1):S11-S61.     Assessment/Plan 1. Loss of weight -with decreased appetite. -encouraged daughter and pt to get pt more involved in activities, make meals social, add supplement and to eat  3 meals a day -may liberalized diet.  - mirtazapine (REMERON) 15 MG tablet; Take 1 tablet (15 mg total) by mouth at bedtime. For appetite  Dispense: 30 tablet; Refill: 3 discussed side effects and to call if she does not tolerate.  2. Postoperative hypothyroidism -recently synthroid was adjusted by GI, will follow up TSH before next visit in 2 months  - TSH; Future  3. Essential hypertension -conts on losartan-hctz - CBC With differential/Platelet; Future - Comprehensive metabolic panel; Future  4. High cholesterol -was previously taking lipitor but due to all her GI complaints this was stopped.   5. Anxiety Pt feels like most of her issues are anxiety driven. Zoloft was decreased to 25 mg daily due to side effects of nausea. Has not noticed changed with this adjustment. May need to be increased back to 50 mg in the future.

## 2014-03-24 NOTE — Patient Instructions (Signed)
Eat 3 meals a day, supplements in between meals Liberalize diet Make plans for meals Plan 1-2 activities a week  Increase exercise can help with anxiety but make sure you are eating if you exercise  Will start remeron 15 mg at bedtime for appetite

## 2014-03-24 NOTE — Progress Notes (Signed)
Failed clock drawing  

## 2014-05-12 ENCOUNTER — Ambulatory Visit: Payer: BLUE CROSS/BLUE SHIELD

## 2014-05-16 ENCOUNTER — Ambulatory Visit
Admission: RE | Admit: 2014-05-16 | Discharge: 2014-05-16 | Disposition: A | Discharge status: Home / Self Care | Payer: BLUE CROSS/BLUE SHIELD | Source: Ambulatory Visit

## 2014-05-16 DIAGNOSIS — Z1231 Encounter for screening mammogram for malignant neoplasm of breast: Secondary | ICD-10-CM

## 2014-05-19 ENCOUNTER — Other Ambulatory Visit: Payer: BLUE CROSS/BLUE SHIELD

## 2014-05-19 DIAGNOSIS — E890 Postprocedural hypothyroidism: Secondary | ICD-10-CM

## 2014-05-19 DIAGNOSIS — I10 Essential (primary) hypertension: Secondary | ICD-10-CM

## 2014-05-20 LAB — CBC WITH DIFFERENTIAL
BASOS ABS: 0 10*3/uL (ref 0.0–0.2)
BASOS: 0 %
EOS: 2 %
Eosinophils Absolute: 0.2 10*3/uL (ref 0.0–0.4)
HCT: 39.3 % (ref 34.0–46.6)
Hemoglobin: 13.5 g/dL (ref 11.1–15.9)
IMMATURE GRANULOCYTES: 0 %
Immature Grans (Abs): 0 10*3/uL (ref 0.0–0.1)
LYMPHS: 28 %
Lymphocytes Absolute: 2.1 10*3/uL (ref 0.7–3.1)
MCH: 32.7 pg (ref 26.6–33.0)
MCHC: 34.4 g/dL (ref 31.5–35.7)
MCV: 95 fL (ref 79–97)
Monocytes Absolute: 0.7 10*3/uL (ref 0.1–0.9)
Monocytes: 10 %
NEUTROS PCT: 60 %
Neutrophils Absolute: 4.4 10*3/uL (ref 1.4–7.0)
RBC: 4.13 x10E6/uL (ref 3.77–5.28)
RDW: 13.8 % (ref 12.3–15.4)
WBC: 7.4 10*3/uL (ref 3.4–10.8)

## 2014-05-20 LAB — TSH: TSH: 1.53 u[IU]/mL (ref 0.450–4.500)

## 2014-05-20 LAB — COMPREHENSIVE METABOLIC PANEL
ALBUMIN: 4.2 g/dL (ref 3.5–4.7)
ALT: 30 IU/L (ref 0–32)
AST: 33 IU/L (ref 0–40)
Albumin/Globulin Ratio: 1.6 (ref 1.1–2.5)
Alkaline Phosphatase: 98 IU/L (ref 39–117)
BILIRUBIN TOTAL: 0.8 mg/dL (ref 0.0–1.2)
BUN / CREAT RATIO: 18 (ref 11–26)
BUN: 24 mg/dL (ref 8–27)
CO2: 24 mmol/L (ref 18–29)
Calcium: 8.6 mg/dL — ABNORMAL LOW (ref 8.7–10.3)
Chloride: 102 mmol/L (ref 97–108)
Creatinine, Ser: 1.35 mg/dL — ABNORMAL HIGH (ref 0.57–1.00)
GFR, EST AFRICAN AMERICAN: 40 mL/min/{1.73_m2} — AB (ref >59)
GFR, EST NON AFRICAN AMERICAN: 35 mL/min/{1.73_m2} — AB (ref >59)
GLUCOSE: 89 mg/dL (ref 65–99)
Globulin, Total: 2.6 g/dL (ref 1.5–4.5)
Potassium: 3.9 mmol/L (ref 3.5–5.2)
Sodium: 143 mmol/L (ref 134–144)
Total Protein: 6.8 g/dL (ref 6.0–8.5)

## 2014-05-23 ENCOUNTER — Ambulatory Visit (INDEPENDENT_AMBULATORY_CARE_PROVIDER_SITE_OTHER): Payer: BLUE CROSS/BLUE SHIELD | Admitting: Nurse Practitioner

## 2014-05-23 ENCOUNTER — Encounter: Payer: Self-pay | Admitting: Nurse Practitioner

## 2014-05-23 VITALS — BP 130/80 | HR 100 | Temp 98.8°F | Resp 18 | Ht 65.0 in | Wt 122.2 lb

## 2014-05-23 DIAGNOSIS — F419 Anxiety disorder, unspecified: Secondary | ICD-10-CM | POA: Diagnosis not present

## 2014-05-23 DIAGNOSIS — E890 Postprocedural hypothyroidism: Secondary | ICD-10-CM

## 2014-05-23 DIAGNOSIS — I10 Essential (primary) hypertension: Secondary | ICD-10-CM | POA: Diagnosis not present

## 2014-05-23 DIAGNOSIS — E7800 Pure hypercholesterolemia, unspecified: Secondary | ICD-10-CM

## 2014-05-23 DIAGNOSIS — R634 Abnormal weight loss: Secondary | ICD-10-CM

## 2014-05-23 MED ORDER — LOSARTAN POTASSIUM 100 MG PO TABS: 100.0000 mg | ORAL_TABLET | Freq: Every day | ORAL | Status: DC

## 2014-05-23 MED ORDER — MIRTAZAPINE 15 MG PO TABS: 15.0000 mg | ORAL_TABLET | Freq: Every day | ORAL | Status: DC

## 2014-05-23 MED ORDER — LEVOTHYROXINE SODIUM 75 MCG PO TABS: 75.0000 ug | ORAL_TABLET | Freq: Every day | ORAL | Status: DC

## 2014-05-23 NOTE — Progress Notes (Signed)
Patient ID: Lauren [name], female   DOB: 10/27/1924, 81 y.o.   MRN: [id number]    PCP: [name], NP  Allergies  Allergen Reactions  . Aspirin     Upset stomach     Chief Complaint  Patient presents with  . Medical Management of Chronic Issues     HPI: Patient is a 81 y.o. female seen in the office today to follow up. Pt with pmh of anorexia, anxiety, hypothyroid, htn. Pt reports she is back to volunteering at the [employer] twice a week. Eating better and her stomach is fine.  No problems with this at this time. Admits to not drinking enough water. Taking Remeron 15 mg qhs. Mood has improved as well. Also going back to church. Labs reviewed with pt  Review of Systems:  Review of Systems  Constitutional: Negative for activity change, appetite change, fatigue and unexpected weight change.       Weight gain since on remeron  HENT: Positive for hearing loss. Negative for congestion.   Eyes: Negative.   Respiratory: Negative for cough and shortness of breath.   Cardiovascular: Negative for chest pain, palpitations and leg swelling.  Gastrointestinal: Negative for abdominal pain, diarrhea, constipation, blood in stool and abdominal distention.  Genitourinary: Negative for dysuria and difficulty urinating.  Musculoskeletal: Negative for myalgias and arthralgias.  Skin: Negative for color change and wound.  Neurological: Negative for dizziness, tremors, seizures, weakness, light-headedness and headaches.  Psychiatric/Behavioral: Negative for behavioral problems, confusion and agitation. The patient is nervous/anxious (has improved).     Past Medical History  Diagnosis Date  . Thyroid disease   . Hypertension   . Osteoporosis   . Rosacea   . High cholesterol   . Anxiety   . Weight loss    Past Surgical History  Procedure Laterality Date  . Thyroidectomy  2003    [place]  . Cholecystectomy  1959    [place]  . Tonsillectomy and adenoidectomy     Social  History:   reports that she has never smoked. She does not have any smokeless tobacco history on file. She reports that she does not drink alcohol or use illicit drugs.  Family History  Problem Relation Age of Onset  . Hypertension Mother   . Stroke Mother   . Stroke Father   . Lung cancer Daughter     Medications: Patient's Medications  New Prescriptions   No medications on file  Previous Medications   CALCIUM CARBONATE (TUMS EX) 750 MG CHEWABLE TABLET    Chew 1 tablet by mouth as needed for heartburn.   LEVOTHYROXINE (SYNTHROID, LEVOTHROID) 75 MCG TABLET    Take 75 mcg by mouth daily before breakfast.    LOSARTAN-HYDROCHLOROTHIAZIDE (HYZAAR) 100-12.5 MG PER TABLET    Take 1 tablet by mouth daily.   MIRTAZAPINE (REMERON) 15 MG TABLET    Take 1 tablet (15 mg total) by mouth at bedtime. For appetite   MULTIPLE VITAMIN (MULTIVITAMIN WITH MINERALS) TABS TABLET    Take 1 tablet by mouth daily.  Modified Medications   No medications on file  Discontinued Medications   OMEPRAZOLE (PRILOSEC) 20 MG CAPSULE    Take 20 mg by mouth daily.   SERTRALINE (ZOLOFT) 25 MG TABLET    Take 25 mg by mouth daily.     Physical Exam:  Filed Vitals:   05/23/14 1309  BP: 130/80  Pulse: 100  Temp: 98.8 F (37.1 C)  TempSrc: Oral  Resp: 18  Height: 5\' 5"  (1.651 m)  Weight: 122 lb 3.2 oz (55.43 kg)  SpO2: 97%    Physical Exam  Constitutional: She is oriented to person, place, and time. She appears well-developed and well-nourished. No distress.  HENT:  Head: Normocephalic and atraumatic.  Mouth/Throat: Oropharynx is clear and moist. No oropharyngeal exudate.  Eyes: Conjunctivae are normal. Pupils are equal, round, and reactive to light.  Neck: Normal range of motion. Neck supple.  Cardiovascular: Normal rate, regular rhythm and normal heart sounds.   Pulmonary/Chest: Effort normal and breath sounds normal.  Abdominal: Soft. Bowel sounds are normal.  Musculoskeletal: She exhibits no edema or  tenderness.  Neurological: She is alert and oriented to person, place, and time.  Skin: Skin is warm and dry. She is not diaphoretic.  Psychiatric: She has a normal mood and affect.    Labs reviewed: Basic Metabolic Panel:  Recent Labs  02/12/14 0817 05/19/14 0847  NA 140 143  K 3.5* 3.9  CL 99 102  CO2 23 24  GLUCOSE 110* 89  BUN 21 24  CREATININE 1.20* 1.35*  CALCIUM 8.7 8.6*  TSH  --  1.530   Liver Function Tests:  Recent Labs  02/12/14 0817 05/19/14 0847  AST 26 33  ALT 13 30  ALKPHOS 75 98  BILITOT 0.8 0.8  PROT 7.5 6.8  ALBUMIN 3.8  --     Recent Labs  02/12/14 0817  LIPASE 40   No results for input(s): AMMONIA in the last 8760 hours. CBC:  Recent Labs  02/12/14 0817 05/19/14 0847  WBC 9.6 7.4  NEUTROABS 7.7 4.4  HGB 14.2 13.5  HCT 41.8 39.3  MCV 96.1 95  PLT 165  --    Lipid Panel: No results for input(s): CHOL, HDL, LDLCALC, TRIG, CHOLHDL, LDLDIRECT in the last 8760 hours. TSH:  Recent Labs  05/19/14 0847  TSH 1.530   A1C: Lab Results  Component Value Date   HGBA1C * 11/23/2009    5.7 (NOTE)                                                                       According to the ADA Clinical Practice Recommendations for 2011, when HbA1c is used as a screening test:   >=6.5%   Diagnostic of Diabetes Mellitus           (if abnormal result  is confirmed)  5.7-6.4%   Increased risk of developing Diabetes Mellitus  References:Diagnosis and Classification of Diabetes Mellitus,Diabetes [id number] 1):S62-S69 and Standards of Medical Care in         Diabetes - 2011,Diabetes [id number]  (Suppl 1):S11-S61.     Assessment/Plan  1. Loss of weight -appetite and stomach issues have significantly improved on Remeron, will cont this for now and monitor weight -has had positive weight gain in the last 2 months, was down from 125 lbs to 110lb prior to remeron, current weight at 122 lbs.  - mirtazapine (REMERON) 15 MG tablet; Take 1 tablet  (15 mg total) by mouth at bedtime. For appetite  Dispense: 90 tablet; Refill: 3  2. High cholesterol -was taken off statin due to stomach issues, will follow up lipid panel - Lipid panel; Future  3. Anxiety -has improved with increase  involvement in community.  -daughter also feels like Remeron is helping anxiety as well, will cont Remeron, apparently not taking Zoloft at this time.   4. Postoperative hypothyroidism -TSH reviewed with pt and daughter, cont synthroid 75 mcg - levothyroxine (SYNTHROID, LEVOTHROID) 75 MCG tablet; Take 1 tablet (75 mcg total) by mouth daily before breakfast.  Dispense: 90 tablet; Refill: 3  5. Essential hypertension -blood pressure at goal, however due to worsening kidney function with minimal water intake will DC hyzaar and start losartan only -pt to increase fluid intake - losartan (COZAAR) 100 MG tablet; Take 1 tablet (100 mg total) by mouth daily.  Dispense: 90 tablet; Refill: 3 - Basic metabolic panel; Future to follow up BUN/CR  Follow up in 6 weeks with Dr Mariea Clonts for weight loss, blood pressure and lab review

## 2014-05-23 NOTE — Patient Instructions (Signed)
Increase water intake Will STOP hyzaar once Losartan gets here Take blood pressure twice a week-- while sitting relaxed legs uncrossed for ~ 5 mins  Record blood pressure and Heart Rate and bring to next visit  Follow up With Dr Mariea Clonts in 6 weeks Fasting blood work prior to visit

## 2014-07-04 ENCOUNTER — Other Ambulatory Visit: Payer: Medicare Other

## 2014-07-04 DIAGNOSIS — E7800 Pure hypercholesterolemia, unspecified: Secondary | ICD-10-CM

## 2014-07-04 DIAGNOSIS — I10 Essential (primary) hypertension: Secondary | ICD-10-CM

## 2014-07-05 LAB — BASIC METABOLIC PANEL
BUN / CREAT RATIO: 22 (ref 11–26)
BUN: 27 mg/dL (ref 8–27)
CO2: 26 mmol/L (ref 18–29)
CREATININE: 1.25 mg/dL — AB (ref 0.57–1.00)
Calcium: 8.4 mg/dL — ABNORMAL LOW (ref 8.7–10.3)
Chloride: 104 mmol/L (ref 97–108)
GFR, EST AFRICAN AMERICAN: 44 mL/min/{1.73_m2} — AB (ref >59)
GFR, EST NON AFRICAN AMERICAN: 38 mL/min/{1.73_m2} — AB (ref >59)
Glucose: 89 mg/dL (ref 65–99)
Potassium: 3.7 mmol/L (ref 3.5–5.2)
Sodium: 145 mmol/L — ABNORMAL HIGH (ref 134–144)

## 2014-07-05 LAB — LIPID PANEL
CHOLESTEROL TOTAL: 232 mg/dL — AB (ref 100–199)
Chol/HDL Ratio: 4.5 ratio units — ABNORMAL HIGH (ref 0.0–4.4)
HDL: 51 mg/dL (ref >39)
LDL CALC: 155 mg/dL — AB (ref 0–99)
Triglycerides: 130 mg/dL (ref 0–149)
VLDL CHOLESTEROL CAL: 26 mg/dL (ref 5–40)

## 2014-07-07 ENCOUNTER — Other Ambulatory Visit: Payer: BLUE CROSS/BLUE SHIELD

## 2014-07-11 ENCOUNTER — Encounter: Payer: Self-pay | Admitting: Internal Medicine

## 2014-07-11 ENCOUNTER — Ambulatory Visit (INDEPENDENT_AMBULATORY_CARE_PROVIDER_SITE_OTHER): Payer: Medicare Other | Admitting: Internal Medicine

## 2014-07-11 VITALS — BP 150/90 | HR 106 | Temp 98.3°F | Resp 18 | Ht 65.0 in | Wt 123.4 lb

## 2014-07-11 DIAGNOSIS — E890 Postprocedural hypothyroidism: Secondary | ICD-10-CM | POA: Diagnosis not present

## 2014-07-11 DIAGNOSIS — I10 Essential (primary) hypertension: Secondary | ICD-10-CM | POA: Diagnosis not present

## 2014-07-11 DIAGNOSIS — F411 Generalized anxiety disorder: Secondary | ICD-10-CM

## 2014-07-11 DIAGNOSIS — R634 Abnormal weight loss: Secondary | ICD-10-CM | POA: Diagnosis not present

## 2014-07-11 NOTE — Patient Instructions (Signed)
Please bring us a copy of your living will and health care power of attorney.  

## 2014-07-11 NOTE — Progress Notes (Signed)
Patient ID: Lauren [name], female   DOB: January 06, 1925, 81 y.o.   MRN: [id number]   Location:  [facility]  Code Status: DNR Goals of Care: Advanced Directive information Does patient have an advance directive?: Yes, Type of Advance Directive: Living will;Healthcare Power of Attorney, Does patient want to make changes to advanced directive?: No - Patient declined   Allergies  Allergen Reactions  . Aspirin     Upset stomach     Chief Complaint  Patient presents with  . Medical Management of Chronic Issues    HPI: Patient is a 81 y.o. white female seen in the office today for med mgt of chronic diseases.  Pt not crazy about driving here.   Takes her remeron every night.  Eats two meals per day, not 3.  Doesn't do much activity to work up an appetite.  Weight has stabilized.   Worries if she doesn't have something to worry about.  Still does her volunteering.  Uneasy, anxious feeling.  Doesn't know what makes her feel that way.  Says no reason for it.  Sleeps well.  Says she is not depressed.   Remeron has leveled off her mood some per her daughter.   No pain.  Rare ibuprofen.   Tried to discuss therapy for her mood, but she refuses.   Is going to get out and walk--lives in condo.   Says she could use more friends.   MMSE - Mini Mental State Exam 03/24/2014  Orientation to time 5  Orientation to Place 3  Registration 3  Attention/ Calculation 5  Recall 2  Language- name 2 objects 2  Language- repeat 1  Language- follow 3 step command 3  Language- read & follow direction 1  Write a sentence 1  Copy design 1  Total score 27    Review of Systems:  Review of Systems  Constitutional:       Wt stable  Respiratory: Negative for shortness of breath.   Cardiovascular: Negative for chest pain and leg swelling.  Gastrointestinal: Negative for abdominal pain.  Genitourinary: Negative for dysuria.  Musculoskeletal: Negative for falls.    Neurological: Negative for dizziness.  Psychiatric/Behavioral: Negative for depression and memory loss. The patient is nervous/anxious and has insomnia.      Past Medical History  Diagnosis Date  . Thyroid disease   . Hypertension   . Osteoporosis   . Rosacea   . High cholesterol   . Anxiety   . Weight loss     Past Surgical History  Procedure Laterality Date  . Thyroidectomy  2003    [place]  . Cholecystectomy  1959    [place]  . Tonsillectomy and adenoidectomy      Social History:   reports that she has never smoked. She does not have any smokeless tobacco history on file. She reports that she does not drink alcohol or use illicit drugs.  Family History  Problem Relation Age of Onset  . Hypertension Mother   . Stroke Mother   . Stroke Father   . Lung cancer Daughter     Medications: Patient's Medications  New Prescriptions   No medications on file  Previous Medications   CALCIUM CARBONATE (TUMS EX) 750 MG CHEWABLE TABLET    Chew 1 tablet by mouth as needed for heartburn.   LEVOTHYROXINE (SYNTHROID, LEVOTHROID) 75 MCG TABLET    Take 1 tablet (75 mcg total) by mouth daily before breakfast.   LOSARTAN (COZAAR)  100 MG TABLET    Take 1 tablet (100 mg total) by mouth daily.   MIRTAZAPINE (REMERON) 15 MG TABLET    Take 1 tablet (15 mg total) by mouth at bedtime. For appetite   MULTIPLE VITAMIN (MULTIVITAMIN WITH MINERALS) TABS TABLET    Take 1 tablet by mouth daily.  Modified Medications   No medications on file  Discontinued Medications   No medications on file     Physical Exam: Filed Vitals:   07/11/14 1435  BP: 150/90  Pulse: 106  Temp: 98.3 F (36.8 C)  TempSrc: Oral  Resp: 18  Height: 5\' 5"  (1.651 m)  Weight: 123 lb 6.4 oz (55.974 kg)  SpO2: 97%  Physical Exam  Constitutional: She is oriented to person, place, and time. She appears well-developed and well-nourished. No distress.  Cardiovascular: Normal rate, regular rhythm, normal heart sounds and  intact distal pulses.   Pulmonary/Chest: Effort normal and breath sounds normal. No respiratory distress.  Musculoskeletal: Normal range of motion.  Neurological: She is alert and oriented to person, place, and time.  Skin: Skin is warm and dry.  Psychiatric: She has a normal mood and affect.  Does not appear overtly anxious, but says she is     Labs reviewed: Basic Metabolic Panel:  Recent Labs  02/12/14 0817 05/19/14 0847 07/04/14 0815  NA 140 143 145*  K 3.5* 3.9 3.7  CL 99 102 104  CO2 23 24 26   GLUCOSE 110* 89 89  BUN 21 24 27   CREATININE 1.20* 1.35* 1.25*  CALCIUM 8.7 8.6* 8.4*  TSH  --  1.530  --    Liver Function Tests:  Recent Labs  02/12/14 0817 05/19/14 0847  AST 26 33  ALT 13 30  ALKPHOS 75 98  BILITOT 0.8 0.8  PROT 7.5 6.8  ALBUMIN 3.8  --     Recent Labs  02/12/14 0817  LIPASE 40   No results for input(s): AMMONIA in the last 8760 hours. CBC:  Recent Labs  02/12/14 0817 05/19/14 0847  WBC 9.6 7.4  NEUTROABS 7.7 4.4  HGB 14.2 13.5  HCT 41.8 39.3  MCV 96.1 95  PLT 165  --    Lipid Panel:  Recent Labs  07/04/14 0815  CHOL 232*  HDL 51  LDLCALC 155*  TRIG 130  CHOLHDL 4.5*   Lab Results  Component Value Date   HGBA1C * 11/23/2009    5.7 (NOTE)                                                                       According to the ADA Clinical Practice Recommendations for 2011, when HbA1c is used as a screening test:   >=6.5%   Diagnostic of Diabetes Mellitus           (if abnormal result  is confirmed)  5.7-6.4%   Increased risk of developing Diabetes Mellitus  References:Diagnosis and Classification of Diabetes Mellitus,Diabetes [id number] 1):S62-S69 and Standards of Medical Care in         Diabetes - 2011,Diabetes Care,2011,34  (Suppl 1):S11-S61.     Assessment/Plan 1. Anxiety state -we opted to just continue her on the remeron due to concerns that d/c would cause wt loss and more difficulty with  sleep and  mood -could consider adding on wellbutrin at future visit but she didn't want more meds  2. Essential hypertension -bp just above goal--I'd be happy with it under 150/90 and it has been at home so no changes needed  3. Postoperative hypothyroidism -cont current synthroid 78mcg  4. Loss of weight -tsh normal 2 mos ago, probably just not eating enough as is typical in frail elderly -discussed high protein/fat foods that will help her to gain weight  Lab Results  Component Value Date   TSH 1.530 05/19/2014    Labs/tests ordered:  No orders of the defined types were placed in this encounter.    Next appt:  3 mos with Janett Billow re: mood  Randilyn Foisy L. Kentavius Dettore, D.O. [facility] [address], [place] [address] Cell Phone (Mon-Fri 8am-5pm):  [phone] On Call:  [phone] & follow prompts after 5pm & weekends Office Phone:  [phone] Office Fax:  [phone]

## 2014-08-11 ENCOUNTER — Telehealth: Payer: Self-pay | Admitting: *Deleted

## 2014-08-11 MED ORDER — BUPROPION HCL ER (XL) 150 MG PO TB24: ORAL_TABLET | ORAL | Status: DC

## 2014-08-11 NOTE — Telephone Encounter (Signed)
Patient daughter notified and agreed.  

## 2014-08-11 NOTE — Telephone Encounter (Signed)
Patient daughter, [name] called and stated that patient wants the anxiety medication called into pharmacy now. Stated that it was discussed at last appointment. Please Advise.

## 2014-08-11 NOTE — Telephone Encounter (Signed)
Spoke with Dr Mariea Clonts who saw pt last, okay to start Wellbutrin XL 150mg  daily in AM To follow up in office in 1 month

## 2014-10-10 ENCOUNTER — Other Ambulatory Visit: Payer: Medicare Other

## 2014-10-10 DIAGNOSIS — I10 Essential (primary) hypertension: Secondary | ICD-10-CM

## 2014-10-11 LAB — COMPREHENSIVE METABOLIC PANEL
ALT: 13 IU/L (ref 0–32)
AST: 21 IU/L (ref 0–40)
Albumin/Globulin Ratio: 1.5 (ref 1.1–2.5)
Albumin: 4 g/dL (ref 3.5–4.7)
Alkaline Phosphatase: 73 IU/L (ref 39–117)
BUN/Creatinine Ratio: 14 (ref 11–26)
BUN: 16 mg/dL (ref 8–27)
Bilirubin Total: 0.8 mg/dL (ref 0.0–1.2)
CO2: 22 mmol/L (ref 18–29)
Calcium: 8 mg/dL — ABNORMAL LOW (ref 8.7–10.3)
Chloride: 100 mmol/L (ref 97–108)
Creatinine, Ser: 1.15 mg/dL — ABNORMAL HIGH (ref 0.57–1.00)
GFR calc Af Amer: 49 mL/min/{1.73_m2} — ABNORMAL LOW (ref >59)
GFR calc non Af Amer: 43 mL/min/{1.73_m2} — ABNORMAL LOW (ref >59)
Globulin, Total: 2.6 g/dL (ref 1.5–4.5)
Glucose: 92 mg/dL (ref 65–99)
POTASSIUM: 3.7 mmol/L (ref 3.5–5.2)
Sodium: 142 mmol/L (ref 134–144)
Total Protein: 6.6 g/dL (ref 6.0–8.5)

## 2014-10-13 ENCOUNTER — Encounter: Payer: Self-pay | Admitting: Nurse Practitioner

## 2014-10-13 ENCOUNTER — Ambulatory Visit (INDEPENDENT_AMBULATORY_CARE_PROVIDER_SITE_OTHER): Payer: Medicare Other | Admitting: Nurse Practitioner

## 2014-10-13 VITALS — BP 158/100 | HR 90 | Temp 98.3°F | Resp 18 | Ht 65.0 in | Wt 115.4 lb

## 2014-10-13 DIAGNOSIS — N183 Chronic kidney disease, stage 3 unspecified: Secondary | ICD-10-CM

## 2014-10-13 DIAGNOSIS — I10 Essential (primary) hypertension: Secondary | ICD-10-CM | POA: Diagnosis not present

## 2014-10-13 DIAGNOSIS — R634 Abnormal weight loss: Secondary | ICD-10-CM

## 2014-10-13 DIAGNOSIS — F411 Generalized anxiety disorder: Secondary | ICD-10-CM

## 2014-10-13 DIAGNOSIS — H9193 Unspecified hearing loss, bilateral: Secondary | ICD-10-CM

## 2014-10-13 MED ORDER — BUPROPION HCL ER (XL) 150 MG PO TB24: ORAL_TABLET | ORAL | Status: DC

## 2014-10-13 MED ORDER — MIRTAZAPINE 15 MG PO TABS
ORAL_TABLET | ORAL | Status: DC
Start: 2014-10-13 — End: 2014-10-17

## 2014-10-13 NOTE — Progress Notes (Signed)
Patient ID: Lauren [name], female   DOB: 04-14-1924, 81 y.o.   MRN: [id number]    PCP: [name], NP  Allergies  Allergen Reactions  . Aspirin     Upset stomach     Chief Complaint  Patient presents with  . Medical Management of Chronic Issues     HPI: Patient is a 82 y.o. female seen in the office today for routine follow up on chronic conditions. Pt with a pmh of hypothyroid, htn, OP, anxiety and weight loss. pts weight is down from last visit- pt reports appetite is good, only eating 2 meals a day.  Stopped taking Remeron because she ran out, reordered now so when it arrives will restart Pt feels like she is doing good  Daughter and pt report blood pressures are good at home.  Always worrying about something, "nerved up" has always had anxiety  Does not feel like this is overwhelming. 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       Weight loss since off remeron  HENT: Positive for hearing loss. Negative for congestion.   Eyes: Negative.   Respiratory: Negative for cough and shortness of breath.   Cardiovascular: Negative for chest pain, palpitations and leg swelling.  Gastrointestinal: Negative for abdominal pain, diarrhea, constipation, blood in stool and abdominal distention.  Genitourinary: Negative for dysuria and difficulty urinating.  Musculoskeletal: Negative for myalgias and arthralgias.  Skin: Negative for color change and wound.  Neurological:  Negative for dizziness, tremors, seizures, weakness, light-headedness and headaches.  Psychiatric/Behavioral: Negative for behavioral problems, confusion and agitation. The patient is nervous/anxious (has improved).     Past Medical History  Diagnosis Date  . Thyroid disease   . Hypertension   . Osteoporosis   . Rosacea   . High cholesterol   . Anxiety   . Weight loss    Past Surgical History  Procedure Laterality Date  . Thyroidectomy  2003    [place]  . Cholecystectomy  1959    [place]  . Tonsillectomy and adenoidectomy     Social History:   reports that she has never smoked. She does not have any smokeless tobacco history on file. She reports that she does not drink alcohol or use illicit drugs.  Family History  Problem Relation Age of Onset  . Hypertension Mother   . Stroke Mother   . Stroke Father   . Lung cancer Daughter     Medications: Patient's Medications  New Prescriptions   No medications on file  Previous Medications   CALCIUM CARBONATE (TUMS EX) 750 MG CHEWABLE TABLET    Chew 1 tablet by mouth as needed for heartburn.   LEVOTHYROXINE (SYNTHROID, LEVOTHROID) 75 MCG TABLET    Take 1 tablet (75 mcg total) by mouth daily before breakfast.   LOSARTAN (COZAAR) 100 MG TABLET    Take 1 tablet (100 mg total) by mouth daily.   MULTIPLE VITAMIN (MULTIVITAMIN WITH MINERALS) TABS TABLET    Take 1 tablet by mouth daily.  Modified  Medications   Modified Medication Previous Medication   BUPROPION (WELLBUTRIN XL) 150 MG 24 HR TABLET buPROPion (WELLBUTRIN XL) 150 MG 24 hr tablet      Take one tablet by mouth once daily in the morning for anxiety    Take one tablet by mouth once daily in the morning for anxiety   MIRTAZAPINE (REMERON) 15 MG TABLET mirtazapine (REMERON) 15 MG tablet      Due to weight loss to increase appetite and anxiety/depression    Take 1 tablet (15 mg total) by mouth at bedtime. For appetite  Discontinued Medications   No medications on file      Physical Exam:  Filed Vitals:   10/13/14 1458  BP: 158/100  Pulse: 90  Temp: 98.3 F (36.8 C)  TempSrc: Oral  Resp: 18  Height: 5\' 5"  (1.651 m)  Weight: 115 lb 6.4 oz (52.345 kg)  SpO2: 92%    Physical Exam  Constitutional: She is oriented to person, place, and time. She appears well-developed and well-nourished. No distress.  HENT:  Head: Normocephalic and atraumatic.  Right Ear: External ear normal.  Left Ear: External ear normal.  Neck: Normal range of motion. Neck supple.  Cardiovascular: Normal rate, regular rhythm and normal heart sounds.   Pulmonary/Chest: Effort normal and breath sounds normal. No respiratory distress.  Musculoskeletal: Normal range of motion.  Neurological: She is alert and oriented to person, place, and time.  Skin: Skin is warm and dry.  Psychiatric: She has a normal mood and affect.    Labs reviewed: Basic Metabolic Panel:  Recent Labs  05/19/14 0847 07/04/14 0815 10/10/14 0838  NA 143 145* 142  K 3.9 3.7 3.7  CL 102 104 100  CO2 24 26 22   GLUCOSE 89 89 92  BUN 24 27 16   CREATININE 1.35* 1.25* 1.15*  CALCIUM 8.6* 8.4* 8.0*  TSH 1.530  --   --    Liver Function Tests:  Recent Labs  02/12/14 0817 05/19/14 0847 10/10/14 0838  AST 26 33 21  ALT 13 30 13   ALKPHOS 75 98 73  BILITOT 0.8 0.8 0.8  PROT 7.5 6.8 6.6  ALBUMIN 3.8  --   --     Recent Labs  02/12/14 0817  LIPASE 40   No results for input(s): AMMONIA in the last 8760 hours. CBC:  Recent Labs  02/12/14 0817 05/19/14 0847  WBC 9.6 7.4  NEUTROABS 7.7 4.4  HGB 14.2 13.5  HCT 41.8 39.3  MCV 96.1 95  PLT 165  --    Lipid Panel:  Recent Labs  07/04/14 0815  CHOL 232*  HDL 51  LDLCALC 155*  TRIG 130  CHOLHDL 4.5*   TSH:  Recent Labs  05/19/14 0847  TSH 1.530   A1C: Lab Results  Component Value Date   HGBA1C * 11/23/2009    5.7 (NOTE)                                                                       According to the ADA Clinical  Practice Recommendations for 2011, when HbA1c is used as a screening test:   >=6.5%   Diagnostic of Diabetes Mellitus           (if abnormal  result  is confirmed)  5.7-6.4%   Increased risk of developing Diabetes Mellitus  References:Diagnosis and Classification of Diabetes Mellitus,Diabetes [id number] 1):S62-S69 and Standards of Medical Care in         Diabetes - 2011,Diabetes [id number]  (Suppl 1):S11-S61.     Assessment/Plan  1. Loss of weight -weight loss noted since last visit. Has stopped remeron. To restart when supply gets delivered and take daily  - mirtazapine (REMERON) 15 MG tablet; Due to weight loss to increase appetite and anxiety/depression  Dispense: 90 tablet; Refill: 3  2. Anxiety state -stable at this time. Unsure if bupropion is providing a lot of benefit but tolerating medication without side effects. Will cont for low - buPROPion (WELLBUTRIN XL) 150 MG 24 hr tablet; Take one tablet by mouth once daily in the morning for anxiety  Dispense: 90 tablet; Refill: 2  3. Hearing loss, bilateral -canals clear bilaterally, Ambulatory referral to Audiology for further evaluation   4. Essential hypertension Elevated today, on recheck improved to 147/92, pt reports this is not her favorite place to be so elevation may be due to white coat syndrome. Was taking bp at home and was not elevated per pt or daughter however they do not recall numbers -to start taking again at home notify if staying over 150/90 -low sodium diet -cont on losartan 100 mg daily   5. CKD (chronic kidney disease) stage 3, GFR 30-59 ml/min Stable on recent labs, discussed staying hydrated and avoiding NSAIDs   Follow up in 3 months   Samanthamarie Ezzell K. Harle Battiest  [facility] [phone] 8 am - 5 pm) [phone] (after hours)

## 2014-10-13 NOTE — Patient Instructions (Addendum)
Take blood pressure at home notify if blood pressure staying over 150/90  Follow up in 3 months   [facility] ? Address: [address], [place], [place] [address] Hours: Open today  8AM-5PM Phone: [phone]  [facility] ? [facility] Address: [address] [place], [place], [place] [address] Phone: [phone]

## 2014-10-14 ENCOUNTER — Telehealth: Payer: Self-pay | Admitting: Nurse Practitioner

## 2014-10-14 NOTE — Telephone Encounter (Signed)
FYI -  Patient was referred to Audiology for hearing loss, patient was contacted by [place] to schedule an appointment and patient declined.   Office notes indicated daughter requested the referral so I called [name] (daughter) and notified her as well

## 2014-10-17 ENCOUNTER — Other Ambulatory Visit: Payer: Self-pay | Admitting: *Deleted

## 2014-10-17 DIAGNOSIS — R634 Abnormal weight loss: Secondary | ICD-10-CM

## 2014-10-17 MED ORDER — MIRTAZAPINE 15 MG PO TABS: ORAL_TABLET | ORAL | Status: DC

## 2014-10-17 NOTE — Telephone Encounter (Signed)
[facility]

## 2014-12-14 ENCOUNTER — Telehealth: Payer: Self-pay

## 2014-12-14 NOTE — Telephone Encounter (Signed)
Patient's daughter was informed patient needs OV. [name] will discuss with patient and call back if she agrees to be seen

## 2014-12-14 NOTE — Telephone Encounter (Signed)
Please offer OV>

## 2014-12-14 NOTE — Telephone Encounter (Signed)
Message was left on triage voicemail by patient's daughter. Patient with increased anxiety and needs a stronger anxiety medication. Last OV 10/13/14, pending OV 01/19/15  Please advise

## 2014-12-15 ENCOUNTER — Ambulatory Visit (INDEPENDENT_AMBULATORY_CARE_PROVIDER_SITE_OTHER): Payer: Medicare Other | Admitting: Nurse Practitioner

## 2014-12-15 ENCOUNTER — Encounter: Payer: Self-pay | Admitting: Nurse Practitioner

## 2014-12-15 VITALS — BP 128/80 | HR 93 | Temp 98.1°F | Resp 20 | Ht 67.0 in | Wt 112.2 lb

## 2014-12-15 DIAGNOSIS — F411 Generalized anxiety disorder: Secondary | ICD-10-CM | POA: Diagnosis not present

## 2014-12-15 DIAGNOSIS — R634 Abnormal weight loss: Secondary | ICD-10-CM

## 2014-12-15 NOTE — Patient Instructions (Signed)
To try to drink nutritional supplement 1-2 times daily  Resource (made by boost) Gwyneth Revels- for nutritional supplement  Try to increase meals to 3 meals a day.   Cont to take Remeron 15 mg daily at night and Wellbutrin  150 mg daily- if you start feeling like anxiety is overwhelming, please follow up sooner     Keep follow up.

## 2014-12-15 NOTE — Progress Notes (Signed)
Patient ID: Lauren [name], female   DOB: 02/20/1925, 81 y.o.   MRN: [id number]    PCP: [name], NP  Advanced Directive information Does patient have an advance directive?: Yes  Allergies  Allergen Reactions  . Aspirin     Upset stomach     Chief Complaint  Patient presents with  . Medical Management of Chronic Issues    Discuss medication increase     HPI: Patient is a 81 y.o. female seen in the office today due to increase in anxiety. Daughter called and feels like her mothers anxiety is not well controlled. Pt reported yesterday she was not okay when it comes to anxiety. Today is fine. 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  Currently taking Wellbutrin and Remeron nightly.  Reports she is bothered by increased anxiety once a week. Not every day. Reports anxiety is fine when she is out and doing things. It is worse when she is at home thinking of things to worry about.  Does not feel like anxiety is overwhelming. Better when she is volunteering. Does not feel like it is worth changing her medication.   Weight is down from last visit. Reports she eats ice cream every day. Only eating 2 meals a day    Review of Systems:  Review of Systems  Constitutional: Positive for unexpected weight change (weight loss). Negative for activity change, appetite change and fatigue.  HENT: Negative for congestion.   Eyes: Negative.   Respiratory: Negative for cough and shortness of breath.   Cardiovascular: Negative for chest pain and palpitations.  Gastrointestinal: Negative for diarrhea, constipation and blood in stool.  Neurological: Negative for dizziness.  Psychiatric/Behavioral: Negative for behavioral problems, confusion and agitation. The patient is nervous/anxious (has improved).     Past Medical History  Diagnosis Date  . Thyroid disease   . Hypertension   . Osteoporosis   . Rosacea   . High cholesterol   . Anxiety   . Weight loss    Past Surgical History  Procedure  Laterality Date  . Thyroidectomy  2003    [place]  . Cholecystectomy  1959    [place]  . Tonsillectomy and adenoidectomy     Social History:   reports that she has never smoked. She has never used smokeless tobacco. She reports that she does not drink alcohol or use illicit drugs.  Family History  Problem Relation Age of Onset  . Hypertension Mother   . Stroke Mother   . Stroke Father   . Lung cancer Daughter     Medications: Patient's Medications  New Prescriptions   No medications on file  Previous Medications   BUPROPION (WELLBUTRIN XL) 150 MG 24 HR TABLET    Take one tablet by mouth once daily in the morning for anxiety   CALCIUM CARBONATE (TUMS EX) 750 MG CHEWABLE TABLET    Chew 1 tablet by mouth as needed for heartburn.   LEVOTHYROXINE (SYNTHROID, LEVOTHROID) 75 MCG TABLET    Take 1 tablet (75 mcg total) by mouth daily before breakfast.   LOSARTAN (COZAAR) 100 MG TABLET    Take 1 tablet (100 mg total) by mouth daily.   MIRTAZAPINE (REMERON) 15 MG TABLET    Take one tablet by mouth once daily Due to weight loss to increase appetite and anxiety/depression   MULTIPLE VITAMIN (MULTIVITAMIN WITH MINERALS) TABS TABLET    Take 1 tablet by mouth daily.  Modified Medications   No medications on file  Discontinued Medications  No medications on file     Physical Exam:  Filed Vitals:   12/15/14 1415  BP: 128/80  Pulse: 93  Temp: 98.1 F (36.7 C)  TempSrc: Oral  Resp: 20  Height: 5\' 7"  (1.702 m)  Weight: 112 lb 3.2 oz (50.894 kg)  SpO2: 97%   Body mass index is 17.57 kg/(m^2).  Physical Exam  Constitutional: She is oriented to person, place, and time. She appears well-developed and well-nourished. No distress.  Cardiovascular: Normal rate, regular rhythm and normal heart sounds.   Pulmonary/Chest: Effort normal and breath sounds normal. No respiratory distress.  Musculoskeletal: Normal range of motion.  Neurological: She is alert and oriented to person, place, and  time.  Skin: Skin is warm and dry.  Psychiatric: She has a normal mood and affect.  Does not appear anxious    Labs reviewed: Basic Metabolic Panel:  Recent Labs  05/19/14 0847 07/04/14 0815 10/10/14 0838  NA 143 145* 142  K 3.9 3.7 3.7  CL 102 104 100  CO2 24 26 22   GLUCOSE 89 89 92  BUN 24 27 16   CREATININE 1.35* 1.25* 1.15*  CALCIUM 8.6* 8.4* 8.0*  TSH 1.530  --   --    Liver Function Tests:  Recent Labs  02/12/14 0817 05/19/14 0847 10/10/14 0838  AST 26 33 21  ALT 13 30 13   ALKPHOS 75 98 73  BILITOT 0.8 0.8 0.8  PROT 7.5 6.8 6.6  ALBUMIN 3.8  --   --     Recent Labs  02/12/14 0817  LIPASE 40   No results for input(s): AMMONIA in the last 8760 hours. CBC:  Recent Labs  02/12/14 0817 05/19/14 0847  WBC 9.6 7.4  NEUTROABS 7.7 4.4  HGB 14.2 13.5  HCT 41.8 39.3  MCV 96.1 95  PLT 165  --    Lipid Panel:  Recent Labs  07/04/14 0815  CHOL 232*  HDL 51  LDLCALC 155*  TRIG 130  CHOLHDL 4.5*   TSH:  Recent Labs  05/19/14 0847  TSH 1.530   A1C: Lab Results  Component Value Date   HGBA1C * 11/23/2009    5.7 (NOTE)                                                                       According to the ADA Clinical Practice Recommendations for 2011, when HbA1c is used as a screening test:   >=6.5%   Diagnostic of Diabetes Mellitus           (if abnormal result  is confirmed)  5.7-6.4%   Increased risk of developing Diabetes Mellitus  References:Diagnosis and Classification of Diabetes Mellitus,Diabetes D8842878 1):S62-S69 and Standards of Medical Care in         Diabetes - 2011,Diabetes Care,2011,34  (Suppl 1):S11-S61.     Assessment/Plan 1. Anxiety state -to cont to be bothered by anxiety increase but only ~once a week. Does not feel like this is overwhelming and using mechanism to cope with this.  -to cont remeron and Wellbutrin for now.  -discussed medications like xanax for anxiety attacks but not a good alternative due to  side effects -may try counseling for nonpharmacologic intervention   2. Loss of weight -weight loss noted  since last visit. Reports good appetite -Encouraged to eat 3 meals a day -snacks between meals -does not prefer dairy supplements, may use resource breeze 1-2 daily   To keep follow up as scheduled, sooner if needed  Janett Billow K. Harle Battiest  [facility] [phone] 8 am - 5 pm) [phone] (after hours)

## 2014-12-20 ENCOUNTER — Ambulatory Visit: Payer: Medicare Other | Admitting: Nurse Practitioner

## 2015-01-19 ENCOUNTER — Ambulatory Visit: Payer: Medicare Other | Admitting: Nurse Practitioner

## 2015-02-02 ENCOUNTER — Ambulatory Visit (INDEPENDENT_AMBULATORY_CARE_PROVIDER_SITE_OTHER): Payer: Medicare Other | Admitting: Nurse Practitioner

## 2015-02-02 ENCOUNTER — Encounter: Payer: Self-pay | Admitting: Nurse Practitioner

## 2015-02-02 VITALS — BP 160/98 | HR 96 | Temp 97.7°F | Ht 67.0 in | Wt 122.0 lb

## 2015-02-02 DIAGNOSIS — Z23 Encounter for immunization: Secondary | ICD-10-CM

## 2015-02-02 DIAGNOSIS — S51811A Laceration without foreign body of right forearm, initial encounter: Secondary | ICD-10-CM

## 2015-02-02 NOTE — Patient Instructions (Signed)
Skin Tear Care A skin tear is a wound in which the top layer of skin has peeled off. This is a common problem with aging because the skin becomes thinner and more fragile as a person gets older. In addition, some medicines, such as oral corticosteroids, can lead to skin thinning if taken for long periods of time.  A skin tear is often repaired with tape or skin adhesive strips. This keeps the skin that has been peeled off in contact with the healthier skin beneath. Depending on the location of the wound, a bandage (dressing) may be applied over the tape or skin adhesive strips. Sometimes, during the healing process, the skin turns black and dies. Even when this happens, the torn skin acts as a good dressing until the skin underneath gets healthier and repairs itself. HOME CARE INSTRUCTIONS   Change dressings once per day or as directed by your caregiver.  Gently clean the skin tear and the area around the tear using saline solution or mild soap and water.  Do not rub the injured skin dry. Let the area air dry.  Apply petroleum jelly or an antibiotic cream or ointment to keep the tear moist. This will help the wound heal. Do not allow a scab to form.  If the dressing sticks before the next dressing change, moisten it with warm soapy water and gently remove it.  Protect the injured skin until it has healed.  Only take over-the-counter or prescription medicines as directed by your caregiver.  Take showers or baths using warm soapy water. Apply a new dressing after the shower or bath.  Keep all follow-up appointments as directed by your caregiver.  SEEK IMMEDIATE MEDICAL CARE IF:   You have redness, swelling, or increasing pain in the skin tear.  You havepus coming from the skin tear.  You have chills.  You have a red streak that goes away from the skin tear.  You have a bad smell coming from the tear or dressing.  You have a fever or persistent symptoms for more than 2-3 days.  You  have a fever and your symptoms suddenly get worse. MAKE SURE YOU:  Understand these instructions.  Will watch this condition.  Will get help right away if your child is not doing well or gets worse.   This information is not intended to replace advice given to you by your health care provider. Make sure you discuss any questions you have with your health care provider.   Document Released: 11/27/2000 Document Revised: 11/27/2011 Document Reviewed: 09/16/2011 Elsevier Interactive Patient Education [employer].

## 2015-02-02 NOTE — Progress Notes (Signed)
Patient ID: Lauren [name], female   DOB: [birth year], 81 y.o.   MRN: [id number]    PCP: [name], NP   Allergies  Allergen Reactions  . Aspirin     Upset stomach     Chief Complaint  Patient presents with  . Acute Visit    Injured right arm on 01/21/15. Arm bleeding off/on. Used antibiotic ointment   . Immunizations    Question if TDaP needs to be updated, last date unknown     HPI: Patient is a 81 y.o. female seen in the office today due to ongoing bleeding to right arm. Hit on door 1 week ago and keeps bleeding off and on after changing the dressing at night. Pain when she is taking the dressing off otherwise without pain or erythema. No drainage noted.   Review of Systems:  Review of Systems  Constitutional: Negative for fever, chills, activity change and appetite change.  Skin:       Skin tear noted to right forearm  All other systems reviewed and are negative.   Past Medical History  Diagnosis Date  . Thyroid disease   . Hypertension   . Osteoporosis   . Rosacea   . High cholesterol   . Anxiety   . Weight loss    Past Surgical History  Procedure Laterality Date  . Thyroidectomy  2003    [place]  . Cholecystectomy  1959    [place]  . Tonsillectomy and adenoidectomy     Social History:   reports that she has never smoked. She has never used smokeless tobacco. She reports that she does not drink alcohol or use illicit drugs.  Family History  Problem Relation Age of Onset  . Hypertension Mother   . Stroke Mother   . Stroke Father   . Lung cancer Daughter     Medications: Patient's Medications  New Prescriptions   No medications on file  Previous Medications   BUPROPION (WELLBUTRIN XL) 150 MG 24 HR TABLET    Take one tablet by mouth once daily in the morning for anxiety   CALCIUM CARBONATE (TUMS EX) 750 MG CHEWABLE TABLET    Chew 1 tablet by mouth as needed for heartburn.   LEVOTHYROXINE (SYNTHROID, LEVOTHROID) 75 MCG TABLET    Take 1 tablet  (75 mcg total) by mouth daily before breakfast.   LOSARTAN (COZAAR) 100 MG TABLET    Take 1 tablet (100 mg total) by mouth daily.   MIRTAZAPINE (REMERON) 15 MG TABLET    Take one tablet by mouth once daily Due to weight loss to increase appetite and anxiety/depression   MULTIPLE VITAMIN (MULTIVITAMIN WITH MINERALS) TABS TABLET    Take 1 tablet by mouth daily.  Modified Medications   No medications on file  Discontinued Medications   No medications on file     Physical Exam:  Filed Vitals:   02/02/15 1054  BP: 160/98  Pulse: 96  Temp: 97.7 F (36.5 C)  TempSrc: Oral  Height: 5\' 7"  (1.702 m)  Weight: 122 lb (55.339 kg)  SpO2: 96%   Body mass index is 19.1 kg/(m^2).  Physical Exam  Constitutional: She appears well-developed and well-nourished.  Cardiovascular: Normal rate, regular rhythm and normal heart sounds.   Pulmonary/Chest: Effort normal and breath sounds normal.  Skin: Skin is warm and dry.  2 skin tears noted to forearm 3x1, 2x1 without drainage or erythema,both have pink base   Psychiatric: She has a normal mood and affect.  Labs reviewed: Basic Metabolic Panel:  Recent Labs  05/19/14 0847 07/04/14 0815 10/10/14 0838  NA 143 145* 142  K 3.9 3.7 3.7  CL 102 104 100  CO2 24 26 22   GLUCOSE 89 89 92  BUN 24 27 16   CREATININE 1.35* 1.25* 1.15*  CALCIUM 8.6* 8.4* 8.0*  TSH 1.530  --   --    Liver Function Tests:  Recent Labs  02/12/14 0817 05/19/14 0847 10/10/14 0838  AST 26 33 21  ALT 13 30 13   ALKPHOS 75 98 73  BILITOT 0.8 0.8 0.8  PROT 7.5 6.8 6.6  ALBUMIN 3.8 4.2 4.0    Recent Labs  02/12/14 0817  LIPASE 40   No results for input(s): AMMONIA in the last 8760 hours. CBC:  Recent Labs  02/12/14 0817 05/19/14 0847  WBC 9.6 7.4  NEUTROABS 7.7 4.4  HGB 14.2 13.5  HCT 41.8 39.3  MCV 96.1 95  PLT 165  --    Lipid Panel:  Recent Labs  07/04/14 0815  CHOL 232*  HDL 51  LDLCALC 155*  TRIG 130  CHOLHDL 4.5*   TSH:  Recent  Labs  05/19/14 0847  TSH 1.530   A1C: Lab Results  Component Value Date   HGBA1C * 11/23/2009    5.7 (NOTE)                                                                       According to the ADA Clinical Practice Recommendations for 2011, when HbA1c is used as a screening test:   >=6.5%   Diagnostic of Diabetes Mellitus           (if abnormal result  is confirmed)  5.7-6.4%   Increased risk of developing Diabetes Mellitus  References:Diagnosis and Classification of Diabetes Mellitus,Diabetes D8842878 1):S62-S69 and Standards of Medical Care in         Diabetes - 2011,Diabetes Care,2011,34  (Suppl 1):S11-S61.     Assessment/Plan 1. Skin tear of forearm without complication, right, initial encounter - area bleeding after she removes dry dressing which is causing pain.  -to use telfa nonadherent dressing to skin tear daily with vasaline or antibiotic ointment when applying dressing -to use dressing daily for the next few days then as needed to keep area from getting re-injured - Tdap vaccine greater than or equal to 7yo IM  2. Need for Tdap vaccination - Tdap vaccine greater than or equal to 7yo IM   Opal Dinning K. Harle Battiest  [facility] [phone] 8 am - 5 pm) [phone] (after hours)

## 2015-03-03 ENCOUNTER — Emergency Department (HOSPITAL_COMMUNITY)
Admission: EM | Admit: 2015-03-03 | Discharge: 2015-03-03 | Disposition: A | Discharge status: Home / Self Care | Payer: Medicare Other | Attending: Emergency Medicine | Admitting: Emergency Medicine

## 2015-03-03 ENCOUNTER — Encounter (HOSPITAL_COMMUNITY): Payer: Self-pay | Admitting: Emergency Medicine

## 2015-03-03 DIAGNOSIS — R61 Generalized hyperhidrosis: Secondary | ICD-10-CM | POA: Diagnosis not present

## 2015-03-03 DIAGNOSIS — Z872 Personal history of diseases of the skin and subcutaneous tissue: Secondary | ICD-10-CM | POA: Diagnosis not present

## 2015-03-03 DIAGNOSIS — M810 Age-related osteoporosis without current pathological fracture: Secondary | ICD-10-CM | POA: Insufficient documentation

## 2015-03-03 DIAGNOSIS — R110 Nausea: Secondary | ICD-10-CM | POA: Insufficient documentation

## 2015-03-03 DIAGNOSIS — I10 Essential (primary) hypertension: Secondary | ICD-10-CM | POA: Diagnosis not present

## 2015-03-03 DIAGNOSIS — Z79899 Other long term (current) drug therapy: Secondary | ICD-10-CM | POA: Diagnosis not present

## 2015-03-03 DIAGNOSIS — E860 Dehydration: Secondary | ICD-10-CM | POA: Diagnosis not present

## 2015-03-03 DIAGNOSIS — R55 Syncope and collapse: Secondary | ICD-10-CM | POA: Insufficient documentation

## 2015-03-03 DIAGNOSIS — E039 Hypothyroidism, unspecified: Secondary | ICD-10-CM | POA: Insufficient documentation

## 2015-03-03 DIAGNOSIS — F419 Anxiety disorder, unspecified: Secondary | ICD-10-CM | POA: Diagnosis not present

## 2015-03-03 LAB — CBC
HEMATOCRIT: 43.1 % (ref 36.0–46.0)
Hemoglobin: 14.2 g/dL (ref 12.0–15.0)
MCH: 31.8 pg (ref 26.0–34.0)
MCHC: 32.9 g/dL (ref 30.0–36.0)
MCV: 96.6 fL (ref 78.0–100.0)
Platelets: 170 10*3/uL (ref 150–400)
RBC: 4.46 MIL/uL (ref 3.87–5.11)
RDW: 13.8 % (ref 11.5–15.5)
WBC: 8.4 10*3/uL (ref 4.0–10.5)

## 2015-03-03 LAB — CBG MONITORING, ED: Glucose-Capillary: 123 mg/dL — ABNORMAL HIGH (ref 65–99)

## 2015-03-03 LAB — BASIC METABOLIC PANEL
Anion gap: 14 (ref 5–15)
BUN: 24 mg/dL — AB (ref 6–20)
CHLORIDE: 107 mmol/L (ref 101–111)
CO2: 25 mmol/L (ref 22–32)
Calcium: 8.7 mg/dL — ABNORMAL LOW (ref 8.9–10.3)
Creatinine, Ser: 1.49 mg/dL — ABNORMAL HIGH (ref 0.44–1.00)
GFR calc Af Amer: 35 mL/min — ABNORMAL LOW (ref >60)
GFR calc non Af Amer: 30 mL/min — ABNORMAL LOW (ref >60)
GLUCOSE: 132 mg/dL — AB (ref 65–99)
POTASSIUM: 3.7 mmol/L (ref 3.5–5.1)
Sodium: 146 mmol/L — ABNORMAL HIGH (ref 135–145)

## 2015-03-03 LAB — I-STAT TROPONIN, ED: Troponin i, poc: 0 ng/mL (ref 0.00–0.08)

## 2015-03-03 MED ORDER — SODIUM CHLORIDE 0.9 % IV BOLUS (SEPSIS)
500.0000 mL | Freq: Once | INTRAVENOUS | Status: AC
  Administered 2015-03-03: 500 mL via INTRAVENOUS

## 2015-03-03 NOTE — ED Notes (Signed)
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

## 2015-03-03 NOTE — ED Notes (Signed)
Patient asked to obtain urine sample, patient requests to leave ama, states they feel better now and want to go. Patient was made aware that urine was the only pending order at this time, however patient still wants to leave ama, RN made aware.

## 2015-03-03 NOTE — Discharge Instructions (Signed)

## 2015-03-03 NOTE — ED Provider Notes (Signed)
CSN: [id number]     Arrival date & time 03/03/15  1535 History   First MD Initiated Contact with Patient 03/03/15 1557     Chief Complaint  Patient presents with  . Loss of Consciousness     (Consider location/radiation/quality/duration/timing/severity/associated sxs/prior Treatment) Patient is a 81 y.o. female presenting with syncope.  Loss of Consciousness Episode history:  Single Most recent episode:  Today Duration: unclear, a few minutes per report. 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 Thyroid disease   . Hypertension   . Osteoporosis   . Rosacea   . High cholesterol   . Anxiety   . Weight loss    Past Surgical History  Procedure Laterality Date  . Thyroidectomy  2003    [place]  . Cholecystectomy  1959    [place]  . Tonsillectomy and adenoidectomy     Family History  Problem Relation Age of Onset  . Hypertension Mother   . Stroke Mother   . Stroke Father   . Lung cancer Daughter    Social History  Substance Use Topics  . Smoking status: Never Smoker   . Smokeless tobacco: Never Used  . Alcohol Use: No   OB History    No data available     Review of Systems  Constitutional: Positive for diaphoresis. Negative for fever.  HENT: Negative for sore throat.   Eyes: Negative for visual disturbance.  Respiratory: Negative for cough and shortness of breath.   Cardiovascular: Positive for syncope. Negative for chest pain.  Gastrointestinal: Positive for nausea. Negative  for vomiting and abdominal pain.  Genitourinary: Negative for difficulty urinating.  Musculoskeletal: Negative for back pain and neck pain.  Skin: Negative for rash.  Neurological: Negative for focal weakness, seizures, syncope, weakness and headaches.      Allergies  Aspirin  Home Medications   Prior to Admission medications   Medication Sig Start Date End Date Taking? Authorizing Provider  buPROPion (WELLBUTRIN XL) 150 MG 24 hr tablet Take one tablet by mouth once daily in the morning for anxiety 10/13/14  Yes [name], NP  calcium carbonate (TUMS EX) 750 MG chewable tablet Chew 1 tablet by mouth as needed for heartburn.   Yes Historical Provider, MD  levothyroxine (SYNTHROID, LEVOTHROID) 75 MCG tablet Take 1 tablet (75 mcg total) by mouth daily before breakfast. 05/23/14  Yes [name], NP  losartan (COZAAR) 100 MG tablet Take 1 tablet (100 mg total) by mouth daily. 05/23/14  Yes [name], NP  mirtazapine (REMERON) 15 MG tablet Take one tablet by mouth once daily Due to weight loss to increase appetite and anxiety/depression 10/17/14  Yes [name], NP  Multiple Vitamin (MULTIVITAMIN WITH MINERALS) TABS tablet Take 1 tablet by mouth daily.   Yes Historical Provider, MD   BP 165/98 mmHg  Pulse 91  Temp(Src) 97.9 F (36.6 C) (Oral)  Resp 18  SpO2 95% Physical Exam  Constitutional: She is oriented to person, place, and time. She appears well-developed and  well-nourished. No distress.  HENT:  Head: Normocephalic and atraumatic.  Eyes: Conjunctivae and EOM are normal.  Neck: Normal range of motion.  Cardiovascular: Normal rate, regular rhythm, normal heart sounds and intact distal pulses.  Exam reveals no gallop and no friction rub.   No murmur heard. Pulmonary/Chest: Effort normal and breath sounds normal. No respiratory distress. She has no wheezes. She has no rales.  Abdominal: Soft. She exhibits no distension. There is no tenderness. There is no  guarding.  Musculoskeletal: She exhibits no edema or tenderness.  Neurological: She is alert and oriented to person, place, and time.  Skin: Skin is warm and dry. No rash noted. She is not diaphoretic. No erythema.  Nursing note and vitals reviewed.   ED Course  Procedures (including critical care time) Labs Review Labs Reviewed  BASIC METABOLIC PANEL - Abnormal; Notable for the following:    Sodium 146 (*)    Glucose, Bld 132 (*)    BUN 24 (*)    Creatinine, Ser 1.49 (*)    Calcium 8.7 (*)    GFR calc non Af Amer 30 (*)    GFR calc Af Amer 35 (*)    All other components within normal limits  CBG MONITORING, ED - Abnormal; Notable for the following:    Glucose-Capillary 123 (*)    All other components within normal limits  CBC  I-STAT TROPOININ, ED    Imaging Review No results found. I have personally reviewed and evaluated these images and lab results as part of my medical decision-making.   EKG Interpretation   Date/Time:  Friday March 03 2015 15:50:31 EST Ventricular Rate:  96 PR Interval:  202 QRS Duration: 98 QT Interval:  382 QTC Calculation: 483 R Axis:   49 Text Interpretation:  Sinus rhythm RSR' in V1 or V2, right VCD or RVH  Borderline T abnormalities, diffuse leads No significant change since last  tracing , borderline TW changes V4-V6 new Confirmed by [facility] MD, Natiya Seelinger  ([id number]) on 03/03/2015 4:01:14 PM      MDM   Final diagnoses:  Syncope, unspecified syncope type  Dehydration   81 year old female with a history of hypertension, hypothyroidism, hyperlipidemia presents with concern for 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 DDx for syncope includes cardiac arrhythmia, MI, PE, electrolyte abnormality, hypovolemia including dehydration and anemia/GI bleed, infection.   EKG evaluated by me and shows sinus rhythm with no sign of prolonged QTc, no brugada, no sign of HOCM, no significant ischemic ST abnormalities. Patient denies  chest pain or shortness breath and have low suspicion for MI or PE. Denies any black or bloody stool, and hemoglobin is within normal limits.  Patient monitored on telemetry without any significant signs of arrhythmia.  Denies infectious symptoms. Neurologic exam within normal limits and have low suspicion for CVA. Reports not eating a lot today, and has a similar episode of syncope following an episode of dehydration in the past.  Pt with hypernatremia, elevated BUN and Cr 1.49 showing likely dehydration. Given 500cc IVF, feels at baseline. Patient discharged in stable condition with understanding of reasons to return.     Gareth Morgan, MD 03/04/15 [phone]

## 2015-03-23 ENCOUNTER — Ambulatory Visit (INDEPENDENT_AMBULATORY_CARE_PROVIDER_SITE_OTHER): Payer: Medicare Other | Admitting: Nurse Practitioner

## 2015-03-23 ENCOUNTER — Encounter: Payer: Self-pay | Admitting: Nurse Practitioner

## 2015-03-23 VITALS — BP 128/80 | HR 88 | Temp 97.8°F | Ht 67.0 in | Wt 129.0 lb

## 2015-03-23 DIAGNOSIS — F418 Other specified anxiety disorders: Secondary | ICD-10-CM

## 2015-03-23 DIAGNOSIS — N183 Chronic kidney disease, stage 3 unspecified: Secondary | ICD-10-CM

## 2015-03-23 DIAGNOSIS — E890 Postprocedural hypothyroidism: Secondary | ICD-10-CM | POA: Diagnosis not present

## 2015-03-23 DIAGNOSIS — R634 Abnormal weight loss: Secondary | ICD-10-CM

## 2015-03-23 DIAGNOSIS — F329 Major depressive disorder, single episode, unspecified: Secondary | ICD-10-CM

## 2015-03-23 DIAGNOSIS — I10 Essential (primary) hypertension: Secondary | ICD-10-CM

## 2015-03-23 DIAGNOSIS — E7800 Pure hypercholesterolemia, unspecified: Secondary | ICD-10-CM | POA: Diagnosis not present

## 2015-03-23 DIAGNOSIS — F419 Anxiety disorder, unspecified: Secondary | ICD-10-CM

## 2015-03-23 DIAGNOSIS — F32A Depression, unspecified: Secondary | ICD-10-CM

## 2015-03-23 MED ORDER — LOSARTAN POTASSIUM 100 MG PO TABS: 100.0000 mg | ORAL_TABLET | Freq: Every day | ORAL | Status: DC

## 2015-03-23 MED ORDER — LEVOTHYROXINE SODIUM 75 MCG PO TABS: 75.0000 ug | ORAL_TABLET | Freq: Every day | ORAL | Status: DC

## 2015-03-23 MED ORDER — BUPROPION HCL ER (XL) 150 MG PO TB24: ORAL_TABLET | ORAL | Status: DC

## 2015-03-23 MED ORDER — MIRTAZAPINE 15 MG PO TABS: ORAL_TABLET | ORAL | Status: DC

## 2015-03-23 NOTE — Patient Instructions (Signed)
Will get blood work for thyroid and kidney function today  Follow up in 6 months for physical with fasting blood work prior to visit.

## 2015-03-23 NOTE — Progress Notes (Signed)
Patient ID: Lauren [name], female   DOB: 01-06-1925, 82 y.o.   MRN: [id number]    PCP: [name], NP  Advanced Directive information Does patient have an advance directive?: Yes, Type of Advance Directive: [place];Living will, Does patient want to make changes to advanced directive?: No - Patient declined  Allergies  Allergen Reactions  . Aspirin     Upset stomach     Chief Complaint  Patient presents with  . Medical Management of Chronic Issues    3 month follow-up. Here with [name] (daughter)   . BMD    Per daughter [name]) updated within last 2 years through [facility]     HPI: Patient is a 82 y.o. female seen in the office today for medical management of chronic conditions. Pt with pmh of anxiety, hypothyroid, HTN, weight loss.  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  conts on Wellbutrin and Remeron for anxiety and depression. Question if she needs these medications, reports anxiety is fine.  Went to ED in December, felt like she was going to pass out but reports she did not actually have a syncopal episode. Reports she was dehydrated, does not drink enough water. Has increased water intake since the event.  Blood pressure has been good. conts on losartan.    Review of Systems:  Review of Systems  Constitutional: Negative for activity change, appetite change, fatigue and unexpected weight change.  HENT: Negative for congestion.   Eyes: Negative.   Respiratory: Negative for cough and shortness of breath.   Cardiovascular: Negative for chest pain and palpitations.  Gastrointestinal: Negative for diarrhea, constipation and blood in stool.  Neurological: Negative for dizziness.  Psychiatric/Behavioral: Negative for behavioral problems, confusion and agitation. The patient is nervous/anxious (has improved).     Past Medical History  Diagnosis Date  . Thyroid disease   . Hypertension   . Osteoporosis   . Rosacea   . High cholesterol    . Anxiety   . Weight loss    Past Surgical History  Procedure Laterality Date  . Thyroidectomy  2003    [place]  . Cholecystectomy  1959    [place]  . Tonsillectomy and adenoidectomy     Social History:   reports that she has never smoked. She has never used smokeless tobacco. She reports that she does not drink alcohol or use illicit drugs.  Family History  Problem Relation Age of Onset  . Hypertension Mother   . Stroke Mother   . Stroke Father   . Lung cancer Daughter     Medications: Patient's Medications  New Prescriptions   No medications on file  Previous Medications   BUPROPION (WELLBUTRIN XL) 150 MG 24 HR TABLET    Take one tablet by mouth once daily in the morning for anxiety   CALCIUM CARBONATE (TUMS EX) 750 MG CHEWABLE TABLET    Chew 1 tablet by mouth as needed for heartburn.   LEVOTHYROXINE (SYNTHROID, LEVOTHROID) 75 MCG TABLET    Take 1 tablet (75 mcg total) by mouth daily before breakfast.   LOSARTAN (COZAAR) 100 MG TABLET    Take 1 tablet (100 mg total) by mouth daily.   MIRTAZAPINE (REMERON) 15 MG TABLET    Take one tablet by mouth once daily Due to weight loss to increase appetite and anxiety/depression   MULTIPLE VITAMIN (MULTIVITAMIN WITH MINERALS) TABS TABLET    Take 1 tablet by mouth daily.  Modified Medications   No medications  on file  Discontinued Medications   No medications on file     Physical Exam:  Filed Vitals:   03/23/15 1414  BP: 128/80  Pulse: 88  Temp: 97.8 F (36.6 C)  TempSrc: Oral  Height: 5\' 7"  (1.702 m)  Weight: 129 lb (58.514 kg)  SpO2: 96%   Body mass index is 20.2 kg/(m^2).  Physical Exam  Constitutional: She is oriented to person, place, and time. She appears well-developed and well-nourished. No distress.  HENT:  Head: Normocephalic and atraumatic.  Neck: Normal range of motion. Neck supple.  Cardiovascular: Normal rate, regular rhythm and normal heart sounds.   Pulmonary/Chest: Effort normal and breath sounds  normal. No respiratory distress.  Abdominal: Soft. Bowel sounds are normal. She exhibits no distension.  Musculoskeletal: Normal range of motion.  Neurological: She is alert and oriented to person, place, and time.  Skin: Skin is warm and dry.  Psychiatric: She has a normal mood and affect.    Labs reviewed: Basic Metabolic Panel:  Recent Labs  05/19/14 0847 07/04/14 0815 10/10/14 0838 03/03/15 1635  NA 143 145* 142 146*  K 3.9 3.7 3.7 3.7  CL 102 104 100 107  CO2 24 26 22 25   GLUCOSE 89 89 92 132*  BUN 24 27 16  24*  CREATININE 1.35* 1.25* 1.15* 1.49*  CALCIUM 8.6* 8.4* 8.0* 8.7*  TSH 1.530  --   --   --    Liver Function Tests:  Recent Labs  05/19/14 0847 10/10/14 0838  AST 33 21  ALT 30 13  ALKPHOS 98 73  BILITOT 0.8 0.8  PROT 6.8 6.6  ALBUMIN 4.2 4.0   No results for input(s): LIPASE, AMYLASE in the last 8760 hours. No results for input(s): AMMONIA in the last 8760 hours. CBC:  Recent Labs  05/19/14 0847 03/03/15 1635  WBC 7.4 8.4  NEUTROABS 4.4  --   HGB 13.5 14.2  HCT 39.3 43.1  MCV 95 96.6  PLT  --  170   Lipid Panel:  Recent Labs  07/04/14 0815  CHOL 232*  HDL 51  LDLCALC 155*  TRIG 130  CHOLHDL 4.5*   TSH:  Recent Labs  05/19/14 0847  TSH 1.530   A1C: Lab Results  Component Value Date   HGBA1C * 11/23/2009    5.7 (NOTE)                                                                       According to the ADA Clinical Practice Recommendations for 2011, when HbA1c is used as a screening test:   >=6.5%   Diagnostic of Diabetes Mellitus           (if abnormal result  is confirmed)  5.7-6.4%   Increased risk of developing Diabetes Mellitus  References:Diagnosis and Classification of Diabetes Mellitus,Diabetes [id number] 1):S62-S69 and Standards of Medical Care in         Diabetes - 2011,Diabetes Care,2011,34  (Suppl 1):S11-S61.     Assessment/Plan 1. Loss of weight -improved with positive weigh gain noted, Remeron also  helping mood.  - mirtazapine (REMERON) 15 MG tablet; Take one tablet by mouth once daily Due to weight loss to increase appetite and anxiety/depression  Dispense: 90 tablet; Refill: 3  2. Anxiety and depression -improvement in anxiety and depression on remeron, will cont for now  - buPROPion (WELLBUTRIN XL) 150 MG 24 hr tablet; Take one tablet by mouth once daily in the morning for anxiety  Dispense: 90 tablet; Refill: 2 - mirtazapine (REMERON) 15 MG tablet; Take one tablet by mouth once daily Due to weight loss to increase appetite and anxiety/depression  Dispense: 90 tablet; Refill: 3  3. Essential hypertension -blood pressure remains stable, cont diet modifications. -will follow up blood work today - losartan (COZAAR) 100 MG tablet; Take 1 tablet (100 mg total) by mouth daily.  Dispense: 90 tablet; Refill: 3  4. CKD (chronic kidney disease) stage 3, GFR 30-59 ml/min -hydration encouraged, avoid NSAIDS - Basic metabolic panel - CBC with Differential; Future  5. Postoperative hypothyroidism -conts on synthroid, will follow up TSH - TSH - levothyroxine (SYNTHROID, LEVOTHROID) 75 MCG tablet; Take 1 tablet (75 mcg total) by mouth daily before breakfast.  Dispense: 90 tablet; Refill: 3  6. High cholesterol -off medication, will follow up lipids prior to next visit.  - Comprehensive metabolic panel; Future - Lipid panel; Future  Usiel Astarita K. Harle Battiest  [facility] [phone] 8 am - 5 pm) [phone] (after hours)

## 2015-03-24 LAB — BASIC METABOLIC PANEL
BUN/Creatinine Ratio: 20 (ref 11–26)
BUN: 25 mg/dL (ref 8–27)
CHLORIDE: 102 mmol/L (ref 96–106)
CO2: 23 mmol/L (ref 18–29)
CREATININE: 1.22 mg/dL — AB (ref 0.57–1.00)
Calcium: 7.8 mg/dL — ABNORMAL LOW (ref 8.7–10.3)
GFR calc Af Amer: 46 mL/min/{1.73_m2} — ABNORMAL LOW (ref >59)
GFR calc non Af Amer: 40 mL/min/{1.73_m2} — ABNORMAL LOW (ref >59)
GLUCOSE: 108 mg/dL — AB (ref 65–99)
Potassium: 3.9 mmol/L (ref 3.5–5.2)
SODIUM: 143 mmol/L (ref 134–144)

## 2015-03-24 LAB — TSH: TSH: 3.23 u[IU]/mL (ref 0.450–4.500)

## 2015-04-14 ENCOUNTER — Other Ambulatory Visit: Payer: Self-pay

## 2015-04-14 DIAGNOSIS — Z1231 Encounter for screening mammogram for malignant neoplasm of breast: Secondary | ICD-10-CM

## 2015-04-21 ENCOUNTER — Encounter: Payer: Self-pay | Admitting: Endocrinology

## 2015-04-21 ENCOUNTER — Encounter: Payer: Self-pay | Admitting: Family Medicine

## 2015-04-21 ENCOUNTER — Encounter: Payer: Self-pay | Admitting: Nurse Practitioner

## 2015-04-21 ENCOUNTER — Encounter: Payer: Self-pay | Admitting: Gastroenterology

## 2015-05-05 ENCOUNTER — Telehealth: Payer: Self-pay | Admitting: *Deleted

## 2015-05-05 NOTE — Telephone Encounter (Signed)
Pt was not started on these medications in January, has been on medication since May 2016 or longer

## 2015-05-05 NOTE — Telephone Encounter (Signed)
Patient daughter, Juliann Pulse called and stated that patient was started on Remeron and Wellbutrin in January and daughter has noticed that patient is repeating herself and asking a lot of Questions. Patient's mood has improved but daughter is wondering if this is a side effect from the medication. Please Advise.

## 2015-05-05 NOTE — Telephone Encounter (Signed)
She stated that she has noticed a difference, more recently since starting these medications.

## 2015-05-07 NOTE — Telephone Encounter (Signed)
Noted, if she would like to schedule a follow up to discuss more we can see her and we can do a MMSE (has been over a year)

## 2015-05-08 NOTE — Telephone Encounter (Signed)
Its not bad, its just short term. Daughter is going to discuss with patient and call back.

## 2015-05-16 ENCOUNTER — Emergency Department (HOSPITAL_COMMUNITY): Payer: Medicare Other

## 2015-05-16 ENCOUNTER — Telehealth: Payer: Self-pay | Admitting: *Deleted

## 2015-05-16 ENCOUNTER — Encounter (HOSPITAL_COMMUNITY): Payer: Self-pay

## 2015-05-16 ENCOUNTER — Emergency Department (HOSPITAL_COMMUNITY)
Admission: EM | Admit: 2015-05-16 | Discharge: 2015-05-16 | Disposition: A | Discharge status: Home / Self Care | Payer: Medicare Other | Attending: Emergency Medicine | Admitting: Emergency Medicine

## 2015-05-16 DIAGNOSIS — S32592A Other specified fracture of left pubis, initial encounter for closed fracture: Secondary | ICD-10-CM | POA: Insufficient documentation

## 2015-05-16 DIAGNOSIS — Y998 Other external cause status: Secondary | ICD-10-CM | POA: Diagnosis not present

## 2015-05-16 DIAGNOSIS — W010XXA Fall on same level from slipping, tripping and stumbling without subsequent striking against object, initial encounter: Secondary | ICD-10-CM | POA: Insufficient documentation

## 2015-05-16 DIAGNOSIS — Z79899 Other long term (current) drug therapy: Secondary | ICD-10-CM | POA: Insufficient documentation

## 2015-05-16 DIAGNOSIS — Y9389 Activity, other specified: Secondary | ICD-10-CM | POA: Diagnosis not present

## 2015-05-16 DIAGNOSIS — M810 Age-related osteoporosis without current pathological fracture: Secondary | ICD-10-CM | POA: Diagnosis not present

## 2015-05-16 DIAGNOSIS — S329XXA Fracture of unspecified parts of lumbosacral spine and pelvis, initial encounter for closed fracture: Secondary | ICD-10-CM

## 2015-05-16 DIAGNOSIS — I10 Essential (primary) hypertension: Secondary | ICD-10-CM | POA: Insufficient documentation

## 2015-05-16 DIAGNOSIS — S32512A Fracture of superior rim of left pubis, initial encounter for closed fracture: Secondary | ICD-10-CM | POA: Insufficient documentation

## 2015-05-16 DIAGNOSIS — E079 Disorder of thyroid, unspecified: Secondary | ICD-10-CM | POA: Diagnosis not present

## 2015-05-16 DIAGNOSIS — F419 Anxiety disorder, unspecified: Secondary | ICD-10-CM | POA: Insufficient documentation

## 2015-05-16 DIAGNOSIS — Y92009 Unspecified place in unspecified non-institutional (private) residence as the place of occurrence of the external cause: Secondary | ICD-10-CM | POA: Insufficient documentation

## 2015-05-16 DIAGNOSIS — Z872 Personal history of diseases of the skin and subcutaneous tissue: Secondary | ICD-10-CM | POA: Insufficient documentation

## 2015-05-16 DIAGNOSIS — S79912A Unspecified injury of left hip, initial encounter: Secondary | ICD-10-CM | POA: Diagnosis present

## 2015-05-16 MED ORDER — HYDROCODONE-ACETAMINOPHEN 5-325 MG PO TABS
1.0000 | ORAL_TABLET | Freq: Once | ORAL | Status: AC
  Administered 2015-05-16: 1 via ORAL
  Filled 2015-05-16: qty 1

## 2015-05-16 MED ORDER — HYDROCODONE-ACETAMINOPHEN 5-325 MG PO TABS: 1.0000 | ORAL_TABLET | Freq: Four times a day (QID) | ORAL | Status: DC | PRN

## 2015-05-16 NOTE — Progress Notes (Signed)
CSW met with patient at bedside. Daughter was present. Patient states that she fell yesterday. According to patient, a rug at her daughters house is what caused her to fall.  Patient informed CSW that she lives alone in [place]. Patient stated " My house is pretty good for older people". Patient states that the sinks are high, and other fixtures in the house make it easier for her. Patient informed CSW that she completes her ADL's independently. Patient states that she has spoken with Nurse CM and home health is being ordered today.  Daughter states she will stay with patient tonight. Patient states she feels safe to return home. Patient has  No questions for CSW.  Daughter/[name] [phone]  Lauren [name] [phone] ED CSW 05/16/2015 9:57 PM

## 2015-05-16 NOTE — Telephone Encounter (Signed)
Patient daughter, [name] called and stated that patient fell and cannot lift her Left leg. They have steps in the house and she can't move her by herself. Instructed her to call [phone] to have patient taken to hospital to be evaluated. Agreed.

## 2015-05-16 NOTE — ED Notes (Signed)
[facility] at bedside. Notified patients daughter that patient will be home in 30 minutes.

## 2015-05-16 NOTE — Progress Notes (Signed)
CM reviewed in details medicare guidelines, home health [facility]) (length of stay in home, types of [facility] staff available, coverage, primary caregiver, up to 24 hrs before services may be started), Private duty nursing (PDN-coverage, length of stay in the home types of staff available),  assisted living (ASL- coverage, services offered) and Skilled nursing facilities (snf- coverage and services offered) . CM reviewed availability of [place] [facility] to assist pcp to get pt to snf (if desired disposition) from the community level. CM provided pt/family with a list of [place] home health agencies and PDN.   Discussed pt to be further evaluated by unit therapists (PT/OT) for recommendation of level of care and share this with attending MD and unit CM CM discussed inpatient medicare 3 day qualifying criteria and how pt can get pcp to assist with "rehab"snfplacement from home with use of SW (after daughter inquired about getting her mother in rehab) Pt without DME per daughter & pt

## 2015-05-16 NOTE — Progress Notes (Signed)
Faxed [facility] orders for 3N1 and RW Request dtr be called about delivery Faxed orders to [facility] for hhrn, hhpt, hhaide and hhsw  Received fax confirmation for all clinicals and orders sent to [facility] and [facility] Call from [place] at [facility] to state pt DME can be delivered to [facility] only but would take 3+ hours Cm discussed him calling Daughter (dtr) to discuss pick from local office on 05/17/15 prn Corene Cornea to call dtr

## 2015-05-16 NOTE — ED Notes (Signed)
Notified [facility] of need for transportation to personal resident.

## 2015-05-16 NOTE — ED Notes (Signed)
Called patients daughter to update her that patient is still in the room and awaiting [facility] for transportation.

## 2015-05-16 NOTE — Progress Notes (Signed)
ED CM assisted pt to bed pan as daughter watched voided 300 cc of clear yellow urine Pt able to wipe herself and lift her bottom on and off the bed for bedpan to slide under. Pt able to pull her panties up without assist  ED NT made aware of void

## 2015-05-16 NOTE — Discharge Instructions (Signed)
Follow instruction below. Use walker to help you ambulate.  Follow up with orthopedist as needed.  Take pain medication as needed.    Simple Pelvic Fracture, Adult A pelvic fracture is a break in one of the pelvic bones. The pelvic bones include the bones that you sit on and the bones that make up the lower part of your spine. A pelvic fracture is called simple if the broken bones are stable and are not moving out of place. CAUSES  Common causes of this type of fracture include:  A fall.  A car accident.  Force or pressure applied to the pelvis. RISK FACTORS You may be at higher risk for this type of fracture if:  You play high-impact sports.  You are an older person with a condition that causes weak bones (osteoporosis).  You have a bone-weakening disease. SIGNS AND SYMPTOMS Signs and symptoms may include:  Tenderness, swelling, or bruising in the affected area.  Pain when moving the hip.  Pain when walking or standing. DIAGNOSIS A diagnosis is made with a physical exam and X-rays. Sometimes, a CT scan is also done. TREATMENT The goal of treatment is to get the bones to heal in a good position. Treatment of a simple pelvic fracture usually involves staying in bed (bed rest) and using crutches or a walker until the bones heal. Medicines may be prescribed for pain. Medicines may also be prescribed that help to prevent blood clots from forming in the legs. HOME CARE INSTRUCTIONS Managing Pain, Stiffness, and Swelling  If directed, apply ice to the injured area:  Put ice in a plastic bag.  Place a towel between your skin and the bag.  Leave the ice on for 20 minutes, 2-3 times a day.  Raise the injured area above the level of your heart while you are sitting or lying down. Driving  Do not  drive or operate heavy machinery until your health care provider tells you it is safe to do. Activity  Stay on bed rest for as long as directed by your health care provider.  While  on bed rest:  Change the position of your legs every 1-2 hours. This keeps blood moving well through both of your legs.  You may sit for as long as you feel comfortable.  After bed rest:  Avoid strenuous activities for as long as directed by your health care provider.  Return to your normal activities as directed by your health care provider. Ask your health care provider what activities are safe for you. Safety  Do not use the injured limb to support your body weight until your health care provider says that you can. Use crutches or a walker as directed by your health care provider. General Instructions  Do not use any tobacco products, including cigarettes, chewing tobacco, or electronic cigarettes. Tobacco can delay bone healing. If you need help quitting, ask your health care provider.  Take medicines only as directed by your health care provider.  Keep all follow-up visits as directed by your health care provider. This is important. SEEK MEDICAL CARE IF:  Your pain gets worse.  Your pain is not relieved with medicines. SEEK IMMEDIATE MEDICAL CARE IF:  You feel light-headed or faint.  You develop chest pain.  You develop shortness of breath.  You have a fever.  You have blood in your urine or your stools.  You have vaginal bleeding.  You have difficulty or pain with urination or with a bowel movement.  You  have difficulty or increased pain with walking.  You have new or increased swelling in one of your legs.  You have numbness in your legs or groin area.   This information is not intended to replace advice given to you by your health care provider. Make sure you discuss any questions you have with your health care provider.   Document Released: 05/13/2001 Document Revised: 03/25/2014 Document Reviewed: 10/26/2013 Elsevier Interactive Patient Education [employer].

## 2015-05-16 NOTE — ED Notes (Signed)
Called to the non-emergent line to find out the ETA of [facility] for patient. Dispatcher was unable to give a time and stated that all PT[facility] trucks were in use.

## 2015-05-16 NOTE — ED Notes (Signed)
Pt presents via EMS with c/o left hip pain. Pt c/o pain in the center of her hip and also her left hip. Pain is present when she puts weight on her left leg. Pt reported she fell at 5 pm yesterday, did call EMS after the initial fall and was not transported to the hospital.

## 2015-05-16 NOTE — Telephone Encounter (Signed)
Agree, thank you

## 2015-05-16 NOTE — Progress Notes (Signed)
Discussed plan of care with ED PA/NP, Gertie Fey

## 2015-05-16 NOTE — ED Provider Notes (Signed)
CSN: [id number]     Arrival date & time 05/16/15  1332 History   First MD Initiated Contact with Patient 05/16/15 1506     Chief Complaint  Patient presents with  . Hip Pain  . Fall     (Consider location/radiation/quality/duration/timing/severity/associated sxs/prior Treatment) HPI   82 year old female with history of osteoporosis, thyroid disease, anxiety and probably here via EMS from home for evaluation of left hip pain.patient visited her daughter last night and upon stepping on a rug in the front door rug slipped causing patient to fell onto her left side. She denies hitting her head or loss of consciousness. It was a controlled fall. She was having difficulty getting up and states she has pain to her left hip and left buttock with weightbearing. She described pain as "just hurts", improves with resting and worsening with movement. Pain does not radiate down the leg. She denies any significant back pain. She denies any headache or neck pain. She did take some ibuprofen without adequate relief. No numbness weakness or bleeding.  Past Medical History  Diagnosis Date  . Thyroid disease   . Hypertension   . Osteoporosis   . Rosacea   . High cholesterol   . Anxiety   . Weight loss    Past Surgical History  Procedure Laterality Date  . Thyroidectomy  2003    [place]  . Cholecystectomy  1959    [place]  . Tonsillectomy and adenoidectomy     Family History  Problem Relation Age of Onset  . Hypertension Mother   . Stroke Mother   . Stroke Father   . Lung cancer Daughter    Social History  Substance Use Topics  . Smoking status: Never Smoker   . Smokeless tobacco: Never Used  . Alcohol Use: No   OB History    No data available     Review of Systems  Constitutional: Negative for fever.  Gastrointestinal: Negative for abdominal pain.  Genitourinary: Negative for flank pain.  Musculoskeletal: Positive for arthralgias. Negative for back pain.  Neurological: Negative for  numbness.      Allergies  Aspirin  Home Medications   Prior to Admission medications   Medication Sig Start Date End Date Taking? Authorizing Provider  buPROPion (WELLBUTRIN XL) 150 MG 24 hr tablet Take one tablet by mouth once daily in the morning for anxiety 03/23/15  Yes [name], NP  calcium carbonate (TUMS EX) 750 MG chewable tablet Chew 1 tablet by mouth as needed for heartburn.   Yes Historical Provider, MD  levothyroxine (SYNTHROID, LEVOTHROID) 75 MCG tablet Take 1 tablet (75 mcg total) by mouth daily before breakfast. 03/23/15  Yes [name], NP  losartan (COZAAR) 100 MG tablet Take 1 tablet (100 mg total) by mouth daily. 03/23/15  Yes [name], NP  mirtazapine (REMERON) 15 MG tablet Take one tablet by mouth once daily Due to weight loss to increase appetite and anxiety/depression 03/23/15  Yes [name], NP  Multiple Vitamin (MULTIVITAMIN WITH MINERALS) TABS tablet Take 1 tablet by mouth daily.   Yes Historical Provider, MD   BP 181/82 mmHg  Pulse 91  Temp(Src) 98.1 F (36.7 C) (Oral)  Resp 17  SpO2 93% Physical Exam  Constitutional: She appears well-developed and well-nourished. No distress.  HENT:  Head: Atraumatic.  Eyes: Conjunctivae are normal.  Neck: Neck supple.  Cardiovascular: Intact distal pulses.   Abdominal: Soft. There is no tenderness.  Musculoskeletal: She exhibits tenderness (left hip: Tenderness  to left lumbosacral region and along the left groin on palpation with normal hip flexion extension abduction and adduction.  no crepitus or deformity noted. Left knee is nontender to palpation. Left ankle is nontender.).  No significant midline spine tenderness crepitus or step-off.  5 out 5 strength to lower extremities.  Neurological: She is alert.  Skin: No rash noted.  Psychiatric: She has a normal mood and affect.  Nursing note and vitals reviewed.   ED Course  Procedures (including critical care time) Labs Review Labs  Reviewed - No data to display  Imaging Review Ct Hip Left Wo Contrast  05/16/2015  CLINICAL DATA:  Left hip pain.  Fell yesterday at 5 p.m. EXAM: CT OF THE LEFT HIP WITHOUT CONTRAST TECHNIQUE: Multidetector CT imaging of the left hip was performed according to the standard protocol. Multiplanar CT image reconstructions were also generated. COMPARISON:  Radiographs 05/16/2015 FINDINGS: There are superior and inferior pubic rami fractures on the left side likely accounting for the patient's hip pain. These are nondisplaced. The left hip is normally located. No hip fracture or AVN. Mild hip joint degenerative changes. No significant intrapelvic abnormalities are identified. Mild bladder distention. IMPRESSION: Nondisplaced superior and inferior pubic rami fractures on the left, likely accounting for the patient's left hip pain. No hip fracture. Electronically Signed   By: Marijo Sanes M.D.   On: 05/16/2015 17:09   Dg Hip Unilat With Pelvis 2-3 Views Left  05/16/2015  CLINICAL DATA:  Fall EXAM: DG HIP (WITH OR WITHOUT PELVIS) 2-3V LEFT COMPARISON:  None. FINDINGS: No acute fracture. No dislocation. Unremarkable soft tissues. Moderate degenerative changes of the hip and SI joints. IMPRESSION: No acute bony pathology. Electronically Signed   By: Marybelle Killings M.D.   On: 05/16/2015 16:26   I have personally reviewed and evaluated these images and lab results as part of my medical decision-making.   EKG Interpretation None      MDM   Final diagnoses:  Fracture of left pelvis, closed, initial encounter (HCC)    BP 169/83 mmHg  Pulse 89  Temp(Src) 98.6 F (37 C) (Oral)  Resp 18  SpO2 93%   3:24 PM Patient had a mechanical fall yesterday injuring her left hip. She is able to ambulate but endorsed pain to the left hip and left buttock. X-ray ordered. Pain medication offer but patient declined at this time.  4:48 PM X-ray of left hip and pelvis demonstrate no evidence of acute fractures or  dislocation. Given her age and history of osteoporosis, I will obtain a CT scan to rule out occult fracture. Patient expressed concern with needing help at home due to having problem ambulating. I will request our case manager to evaluate patient and provide adequate support for home health needs.  5:30 PM CT scan of left hip demonstrate nondisplaced superior and inferior pubic rami fractures on the left likely accounting for the patient's left hip pain. No hip fracture.  I discussed this finding with pt.  Pt will need to be non weight bearing, and to use a walker to help with ambulating.  Our case manager will help with home health needs.  Care discussed with DR. Ellouise Newer, PA-C 05/16/15 1759  Dorie Rank, MD 05/16/15 [phone]

## 2015-05-16 NOTE — Progress Notes (Signed)
Entered in d/c instructions  Care, [facility] Call on 05/17/2015 As needed [facility] has been sent your information to provided services for nurse, physical therapy, aide and social worker. You may call to check on status if needed [address] [place] [phone] Inc[employer] - Dme [facility] has been sent orders for a rolling walker and 3N1 (bedside commode, elevated toilet seat and shower chair0 Requested they contact you daughter about delivery [place] [address] [phone]

## 2015-05-17 ENCOUNTER — Telehealth: Payer: Self-pay | Admitting: *Deleted

## 2015-05-18 ENCOUNTER — Ambulatory Visit: Payer: BLUE CROSS/BLUE SHIELD

## 2015-05-21 DIAGNOSIS — S32502D Unspecified fracture of left pubis, subsequent encounter for fracture with routine healing: Secondary | ICD-10-CM

## 2015-05-21 DIAGNOSIS — F419 Anxiety disorder, unspecified: Secondary | ICD-10-CM

## 2015-05-21 DIAGNOSIS — M810 Age-related osteoporosis without current pathological fracture: Secondary | ICD-10-CM

## 2015-05-21 DIAGNOSIS — I10 Essential (primary) hypertension: Secondary | ICD-10-CM

## 2015-06-08 ENCOUNTER — Telehealth: Payer: Self-pay

## 2015-06-08 NOTE — Telephone Encounter (Signed)
Lauren [name] from [facility] called to make you aware that the patients B/P has been high this week during her times with her for therapy here are the B/P's ( first day 05/23/15 base rate 128/86 heart rate 98, 06/06/15 156/100 heart rate 96, 06/07/15 144/88 heart rate 98, 06/08/15 158/100 heart rate 98 ) she says if you have any questions you can call her ( # [phone] )

## 2015-06-08 NOTE — Telephone Encounter (Signed)
Noted.  If her blood pressures at rest remain over 150/90, we will need to make adjustments in her medications.

## 2015-06-09 NOTE — Telephone Encounter (Signed)
Left message on VM with results, advised Juliann Pulse to return my call if any questions

## 2015-06-12 ENCOUNTER — Telehealth: Payer: Self-pay

## 2015-06-12 NOTE — Telephone Encounter (Signed)
Cont the same medication for now.  If BPs are remaining over 150/90 after 3 more checks, call back with report.

## 2015-06-12 NOTE — Telephone Encounter (Signed)
Cathy (Physical Therapist) with [facility] called to inform Dr.Reed that patient's B/P today was 142/98. [name] was told to call if blood pressure remained elevated (see phone note dated 06-08-15). Patient may need a change in B/P medication.  Last OV 03-23-15, Pending OV 09-21-15 Current B/P Medication: Losartan 100 mg daily   Please advise

## 2015-06-12 NOTE — Telephone Encounter (Signed)
Called Lauren [name] back, mailbox is full. Discussed with patient, patient states she will share with [facility]

## 2015-06-15 ENCOUNTER — Telehealth: Payer: Self-pay

## 2015-06-15 NOTE — Telephone Encounter (Signed)
Let's add bystolic 5mg  daily to her regimen.  Continue to monitor her heart rate and blood pressure

## 2015-06-15 NOTE — Telephone Encounter (Signed)
[name] called to give update on patients B/P and heart readings for the week 06/12/15 142/98, 06/13/15 158/108, 06/15/15 158/98 hert rate was 100 she would like to know if you want to adjust patient medications if so let her know. ( [phone] ) Please Advise

## 2015-06-16 ENCOUNTER — Telehealth: Payer: Self-pay

## 2015-06-16 MED ORDER — NEBIVOLOL HCL 5 MG PO TABS: 5.0000 mg | ORAL_TABLET | Freq: Every day | ORAL | Status: DC

## 2015-06-16 NOTE — Telephone Encounter (Signed)
Called the nurse [name] back read her Dr. Cyndi Lennert instructions and she said she would  tell patients daughter what was said and  that we have samples of the medication the doctor has ordered. Also to have the daughter call the office to verify which pharmacy she would like her mother's prescription to.

## 2015-06-16 NOTE — Telephone Encounter (Signed)
Patients daughter came in to pick up samples of medication and I doubled checked with her about the pharmacy she would confirmed [facility], prescription sent.  ( Bystolic 5 mg 1 tab po daily)

## 2015-06-20 ENCOUNTER — Telehealth: Payer: Self-pay | Admitting: Nurse Practitioner

## 2015-06-20 NOTE — Telephone Encounter (Signed)
Called daughter, informed her will call her back on tomorrow with medicaiton of choice for her mother per physician...[facility]

## 2015-06-20 NOTE — Telephone Encounter (Signed)
Daughter called, stated [facility] no longer covers  Nebivolol (Bystolic 5mg ), she will call back on the medication they will cover..Carolin Coy

## 2015-06-20 NOTE — Telephone Encounter (Signed)
Daughter called [facility] that [facility] atenolol, Acedutolol, Belaxolol, Bifoprolol, Netoprlol, Succiate Taterate, Programmer, systems per [employer]  Please Advise

## 2015-06-21 MED ORDER — METOPROLOL TARTRATE 25 MG PO TABS: ORAL_TABLET | ORAL | Status: DC

## 2015-06-21 NOTE — Telephone Encounter (Signed)
Drug of choice per Dr. Mariea Clonts Metoprolol Tartrate 25mg - PO, BID, ONE TAB TWICE A DAY...informed patients daughter, to order medication- to triage Rodena Piety).[facility]

## 2015-06-21 NOTE — Telephone Encounter (Signed)
If my translation is correct, let's try her on metoprolol tartrate 25mg  po bid.  (one tablet twice a day)

## 2015-06-21 NOTE — Addendum Note (Signed)
Addended by: Rafael Bihari A on: 06/21/2015 10:52 AM   Modules accepted: Orders, Medications

## 2015-06-21 NOTE — Telephone Encounter (Signed)
Medication list updated and Rx faxed to [facility].

## 2015-07-06 ENCOUNTER — Telehealth: Payer: Self-pay | Admitting: *Deleted

## 2015-07-06 DIAGNOSIS — N183 Chronic kidney disease, stage 3 unspecified: Secondary | ICD-10-CM

## 2015-07-06 DIAGNOSIS — R634 Abnormal weight loss: Secondary | ICD-10-CM

## 2015-07-06 NOTE — Telephone Encounter (Signed)
Juliann Pulse with [facility] called and stated that patient is being discharged from [facility]. Home Health nurse thinks patient still needs to continue with outpatient PT. Patient daughter would like a referral placed to go to [facility] where they have been before. FaxCA:[phone]. Please Advise.

## 2015-07-28 ENCOUNTER — Ambulatory Visit: Payer: BLUE CROSS/BLUE SHIELD

## 2015-08-02 ENCOUNTER — Ambulatory Visit: Payer: BLUE CROSS/BLUE SHIELD

## 2015-09-01 ENCOUNTER — Ambulatory Visit: Payer: BLUE CROSS/BLUE SHIELD

## 2015-09-18 ENCOUNTER — Other Ambulatory Visit: Payer: Medicare Other

## 2015-09-18 DIAGNOSIS — N183 Chronic kidney disease, stage 3 unspecified: Secondary | ICD-10-CM

## 2015-09-18 DIAGNOSIS — E7800 Pure hypercholesterolemia, unspecified: Secondary | ICD-10-CM

## 2015-09-19 LAB — CBC WITH DIFFERENTIAL/PLATELET
BASOS: 0 %
Basophils Absolute: 0 10*3/uL (ref 0.0–0.2)
EOS (ABSOLUTE): 0.2 10*3/uL (ref 0.0–0.4)
EOS: 3 %
HEMATOCRIT: 43.9 % (ref 34.0–46.6)
HEMOGLOBIN: 14.4 g/dL (ref 11.1–15.9)
Immature Grans (Abs): 0 10*3/uL (ref 0.0–0.1)
Immature Granulocytes: 0 %
LYMPHS ABS: 1.3 10*3/uL (ref 0.7–3.1)
Lymphs: 22 %
MCH: 31.5 pg (ref 26.6–33.0)
MCHC: 32.8 g/dL (ref 31.5–35.7)
MCV: 96 fL (ref 79–97)
MONOCYTES: 10 %
MONOS ABS: 0.6 10*3/uL (ref 0.1–0.9)
NEUTROS ABS: 3.9 10*3/uL (ref 1.4–7.0)
Neutrophils: 65 %
Platelets: 153 10*3/uL (ref 150–379)
RBC: 4.57 x10E6/uL (ref 3.77–5.28)
RDW: 14.4 % (ref 12.3–15.4)
WBC: 6.1 10*3/uL (ref 3.4–10.8)

## 2015-09-19 LAB — LIPID PANEL
CHOLESTEROL TOTAL: 251 mg/dL — AB (ref 100–199)
Chol/HDL Ratio: 4.9 ratio units — ABNORMAL HIGH (ref 0.0–4.4)
HDL: 51 mg/dL (ref >39)
LDL Calculated: 178 mg/dL — ABNORMAL HIGH (ref 0–99)
TRIGLYCERIDES: 112 mg/dL (ref 0–149)
VLDL CHOLESTEROL CAL: 22 mg/dL (ref 5–40)

## 2015-09-19 LAB — COMPREHENSIVE METABOLIC PANEL
A/G RATIO: 1.5 (ref 1.2–2.2)
ALT: 16 IU/L (ref 0–32)
AST: 21 IU/L (ref 0–40)
Albumin: 3.9 g/dL (ref 3.5–4.7)
Alkaline Phosphatase: 123 IU/L — ABNORMAL HIGH (ref 39–117)
BILIRUBIN TOTAL: 0.6 mg/dL (ref 0.0–1.2)
BUN/Creatinine Ratio: 11 — ABNORMAL LOW (ref 12–28)
BUN: 16 mg/dL (ref 8–27)
CHLORIDE: 103 mmol/L (ref 96–106)
CO2: 25 mmol/L (ref 18–29)
Calcium: 7.7 mg/dL — ABNORMAL LOW (ref 8.7–10.3)
Creatinine, Ser: 1.4 mg/dL — ABNORMAL HIGH (ref 0.57–1.00)
GFR calc non Af Amer: 33 mL/min/{1.73_m2} — ABNORMAL LOW (ref >59)
GFR, EST AFRICAN AMERICAN: 38 mL/min/{1.73_m2} — AB (ref >59)
GLUCOSE: 78 mg/dL (ref 65–99)
Globulin, Total: 2.6 g/dL (ref 1.5–4.5)
POTASSIUM: 4.2 mmol/L (ref 3.5–5.2)
Sodium: 145 mmol/L — ABNORMAL HIGH (ref 134–144)
TOTAL PROTEIN: 6.5 g/dL (ref 6.0–8.5)

## 2015-09-21 ENCOUNTER — Ambulatory Visit (INDEPENDENT_AMBULATORY_CARE_PROVIDER_SITE_OTHER): Payer: Medicare Other | Admitting: Nurse Practitioner

## 2015-09-21 ENCOUNTER — Encounter: Payer: Self-pay | Admitting: Nurse Practitioner

## 2015-09-21 VITALS — BP 148/94 | HR 69 | Temp 98.0°F | Resp 17 | Ht 67.0 in | Wt 127.0 lb

## 2015-09-21 DIAGNOSIS — I10 Essential (primary) hypertension: Secondary | ICD-10-CM | POA: Diagnosis not present

## 2015-09-21 DIAGNOSIS — F419 Anxiety disorder, unspecified: Secondary | ICD-10-CM

## 2015-09-21 DIAGNOSIS — Z0000 Encounter for general adult medical examination without abnormal findings: Secondary | ICD-10-CM | POA: Diagnosis not present

## 2015-09-21 DIAGNOSIS — N183 Chronic kidney disease, stage 3 unspecified: Secondary | ICD-10-CM

## 2015-09-21 DIAGNOSIS — M810 Age-related osteoporosis without current pathological fracture: Secondary | ICD-10-CM

## 2015-09-21 DIAGNOSIS — R634 Abnormal weight loss: Secondary | ICD-10-CM | POA: Diagnosis not present

## 2015-09-21 DIAGNOSIS — H9193 Unspecified hearing loss, bilateral: Secondary | ICD-10-CM

## 2015-09-21 DIAGNOSIS — R413 Other amnesia: Secondary | ICD-10-CM | POA: Diagnosis not present

## 2015-09-21 DIAGNOSIS — F32A Depression, unspecified: Secondary | ICD-10-CM

## 2015-09-21 DIAGNOSIS — F329 Major depressive disorder, single episode, unspecified: Secondary | ICD-10-CM

## 2015-09-21 DIAGNOSIS — F418 Other specified anxiety disorders: Secondary | ICD-10-CM | POA: Diagnosis not present

## 2015-09-21 DIAGNOSIS — E7800 Pure hypercholesterolemia, unspecified: Secondary | ICD-10-CM

## 2015-09-21 MED ORDER — ATORVASTATIN CALCIUM 10 MG PO TABS: 5.0000 mg | ORAL_TABLET | Freq: Every day | ORAL | Status: DC

## 2015-09-21 MED ORDER — DONEPEZIL HCL 10 MG PO TABS: ORAL_TABLET | ORAL | Status: DC

## 2015-09-21 NOTE — Progress Notes (Signed)
Patient ID: Lauren [name], female   DOB: 03-19-1924, 82 y.o.   MRN: [id number]    PCP: [name], NP  Advanced Directive information Does patient have an advance directive?: Yes, Type of Advance Directive: Living will, Does patient want to make changes to advanced directive?: No - Patient declined  Allergies  Allergen Reactions  . Aspirin     Upset stomach     Chief Complaint  Patient presents with  . Medical Management of Chronic Issues    Complete physical. Labs printed. Failed clock drawing.   . OTHER    Daughter in room and she wants to discuss medications.      HPI: Patient is a 82 y.o. female seen in the office today for annual exam. Feel and fracture pelvis in feb 2017.  Went to rehab, walking every day.   Screenings: Colon Cancer- aged out Breast Cancer- aged out and does not wish to have any more mammogram  Cervical Cancer- aged out Osteoporosis- Dexa Scan- DONE IN 2014- osteoporosis, was noted, was not put on therapy.   Depression screening Depression screen [facility] 2/9 09/21/2015 10/13/2014 07/11/2014 05/23/2014  Decreased Interest 0 0 0 0  Down, Depressed, Hopeless 0 0 0 0  PHQ - 2 Score 0 0 0 0   Falls Fall Risk  09/21/2015 03/23/2015 02/02/2015 12/15/2014 10/13/2014  Falls in the past year? Yes No No No No  Number falls in past yr: 1 - - - -  Injury with Fall? Yes - - - -   MMSE MMSE - Mini Mental State Exam 09/21/2015 03/24/2014  Orientation to time 3 5  Orientation to Place 4 3  Registration 3 3  Attention/ Calculation 5 5  Recall 0 2  Language- name 2 objects 2 2  Language- repeat 1 1  Language- follow 3 step command 2 3  Language- read & follow direction 1 1  Write a sentence 1 1  Copy design 1 1  Total score 23 27  does not memory loss, pt aware short term memory is worse.    Vaccines Immunization History  Administered Date(s) Administered  . DTaP 09/30/2012  . Influenza-Unspecified 01/28/2012, 12/16/2013, 12/14/2014  . Pneumococcal  Conjugate-13 10/05/2013  . Pneumococcal Polysaccharide-23 02/26/2010  . Pneumococcal-Unspecified 03/18/2013  . Tdap 02/02/2015  . Zoster 09/30/2012    Smoking status: non smoker Alcohol use: does not use ETOH  Dentist: every 6 months Ophthalmologist: every year  Exercise regimen: walks every morning Diet: liberalized diet since weight loss, attempts to eat healthy.   Functional Status of ADLs: independent of all ADLs Incontinence- of urine occasionally, wears thin pad  Denies any pain- not on pain medication  Only taking metoprolol once daily  Review of Systems:  Review of Systems  Constitutional: Negative for activity change, appetite change, fatigue and unexpected weight change.  HENT: Negative for congestion.   Eyes: Negative.   Respiratory: Negative for cough and shortness of breath.   Cardiovascular: Negative for chest pain and palpitations.  Gastrointestinal: Negative for diarrhea, constipation and blood in stool.  Neurological: Negative for dizziness.  Psychiatric/Behavioral: Positive for confusion (notes some STML). Negative for behavioral problems and agitation. The patient is not nervous/anxious (has improved).     Past Medical History  Diagnosis Date  . Thyroid disease   . Hypertension   . Osteoporosis   . Rosacea   . High cholesterol   . Anxiety   . Weight loss    Past Surgical History  Procedure Laterality Date  .  Thyroidectomy  2003    [place]  . Cholecystectomy  1959    [place]  . Tonsillectomy and adenoidectomy     Social History:   reports that she has never smoked. She has never used smokeless tobacco. She reports that she does not drink alcohol or use illicit drugs.  Family History  Problem Relation Age of Onset  . Hypertension Mother   . Stroke Mother   . Stroke Father   . Lung cancer Daughter     Medications: Patient's Medications  New Prescriptions   No medications on file  Previous Medications   BUPROPION (WELLBUTRIN XL) 150  MG 24 HR TABLET    Take one tablet by mouth once daily in the morning for anxiety   CALCIUM CARBONATE (TUMS EX) 750 MG CHEWABLE TABLET    Chew 1 tablet by mouth as needed for heartburn.   HYDROCODONE-ACETAMINOPHEN (NORCO/VICODIN) 5-325 MG TABLET    Take 1 tablet by mouth every 6 (six) hours as needed for moderate pain.   LEVOTHYROXINE (SYNTHROID, LEVOTHROID) 75 MCG TABLET    Take 1 tablet (75 mcg total) by mouth daily before breakfast.   LOSARTAN (COZAAR) 100 MG TABLET    Take 1 tablet (100 mg total) by mouth daily.   METOPROLOL TARTRATE (LOPRESSOR) 25 MG TABLET    Take one tablet by mouth twice daily for blood pressure   MIRTAZAPINE (REMERON) 15 MG TABLET    Take one tablet by mouth once daily Due to weight loss to increase appetite and anxiety/depression   MULTIPLE VITAMIN (MULTIVITAMIN WITH MINERALS) TABS TABLET    Take 1 tablet by mouth daily.  Modified Medications   No medications on file  Discontinued Medications   No medications on file     Physical Exam:  Filed Vitals:   09/21/15 1339  BP: 148/94  Pulse: 69  Temp: 98 F (36.7 C)  TempSrc: Oral  Resp: 17  Height: 5\' 7"  (1.702 m)  Weight: 127 lb (57.607 kg)  SpO2: 95%   Body mass index is 19.89 kg/(m^2).  Physical Exam  Constitutional: She is oriented to person, place, and time. She appears well-developed and well-nourished. No distress.  HENT:  Head: Normocephalic and atraumatic.  Neck: Normal range of motion. Neck supple.  Cardiovascular: Normal rate, regular rhythm and normal heart sounds.   Pulmonary/Chest: Effort normal and breath sounds normal. No respiratory distress.  Abdominal: Soft. Bowel sounds are normal. She exhibits no distension.  Musculoskeletal: Normal range of motion.  Neurological: She is oriented to person, place, and time.  Skin: Skin is warm and dry.  Psychiatric: She has a normal mood and affect.    Labs reviewed: Basic Metabolic Panel:  Recent Labs  03/03/15 1635 03/23/15 1514  09/18/15 1022  NA 146* 143 145*  K 3.7 3.9 4.2  CL 107 102 103  CO2 25 23 25   GLUCOSE 132* 108* 78  BUN 24* 25 16  CREATININE 1.49* 1.22* 1.40*  CALCIUM 8.7* 7.8* 7.7*  TSH  --  3.230  --    Liver Function Tests:  Recent Labs  10/10/14 0838 09/18/15 1022  AST 21 21  ALT 13 16  ALKPHOS 73 123*  BILITOT 0.8 0.6  PROT 6.6 6.5  ALBUMIN 4.0 3.9   No results for input(s): LIPASE, AMYLASE in the last 8760 hours. No results for input(s): AMMONIA in the last 8760 hours. CBC:  Recent Labs  03/03/15 1635 09/18/15 1022  WBC 8.4 6.1  NEUTROABS  --  3.9  HGB  14.2  --   HCT 43.1 43.9  MCV 96.6 96  PLT 170 153   Lipid Panel:  Recent Labs  09/18/15 1022  CHOL 251*  HDL 51  LDLCALC 178*  TRIG 112  CHOLHDL 4.9*   TSH:  Recent Labs  03/23/15 1514  TSH 3.230   A1C: Lab Results  Component Value Date   HGBA1C * 11/23/2009    5.7 (NOTE)                                                                       According to the ADA Clinical Practice Recommendations for 2011, when HbA1c is used as a screening test:   >=6.5%   Diagnostic of Diabetes Mellitus           (if abnormal result  is confirmed)  5.7-6.4%   Increased risk of developing Diabetes Mellitus  References:Diagnosis and Classification of Diabetes Mellitus,Diabetes [id number] 1):S62-S69 and Standards of Medical Care in         Diabetes - 2011,Diabetes [id number]  (Suppl 1):S11-S61.     Assessment/Plan 1. Preventative health care Pt doing well. Encouraged to cont walking/exercise. Cont heart healthy diet. MMSE worse than last year. Will start aricept. Discussed bone density and starting medication.   2. Loss of weight Positive weight gain noted on Remeron, weight down 2 lbs from January.  -will cont remeron and monitor   3. Chronic kidney disease, stage III (moderate) Cr slightly worse on recent labs   4. Essential hypertension Blood pressure somewhat elevated today but possible not taking  medication correctly will make sure she is taking as prescribed. Daughter will take her blood pressure at home and notify if remains elevated  5. Anxiety and depression Mood has improved greatly. conts on wellbutrin   6. High cholesterol -discussed cholesterol with pt and daughter, will start low dose lipitor and monitor  - atorvastatin (LIPITOR) 10 MG tablet; Take 0.5 tablets (5 mg total) by mouth daily.  Dispense: 30 tablet; Refill: 1  7. Hearing loss, bilateral Encouraged pt to follow up with audiology for evaluation of hearing aids   8. Memory loss MMSE of 23/30 which is worse from last year.  - will start donepezil (ARICEPT) 10 MG tablet; To take 1/2 tablet daily at bedtime for 1 month then increase to 1 tablet daily at bedtime for memory  Dispense: 30 tablet; Refill: 0  9. Osteoporosis Recent fracture, reviewed dexa scan. Will start prolia 60 mg [place] q 6 months pending insurance approval.  -to take calcium and vit D Daily  To follow up in 6 weeks, sooner if needed  Jessica K. Harle Battiest  [facility] [phone] 8 am - 5 pm) [phone] (after hours)

## 2015-09-21 NOTE — Patient Instructions (Addendum)
Start aricept 5 mg at bedtime for memory In 2 weeks start Lipitor 5 mg daily in the evening Will order prolia for osteoporesis- we will get this approved through your insurance before we administer medication To start calcium and vit d daily--- 1200 mg of calcium (total of diet and supplement) and 800 int. units of vitamin D daily

## 2015-10-11 ENCOUNTER — Telehealth: Payer: Self-pay | Admitting: *Deleted

## 2015-10-11 ENCOUNTER — Encounter (HOSPITAL_COMMUNITY): Payer: Self-pay | Admitting: Emergency Medicine

## 2015-10-11 ENCOUNTER — Emergency Department (HOSPITAL_COMMUNITY)
Admission: EM | Admit: 2015-10-11 | Discharge: 2015-10-11 | Disposition: A | Discharge status: Home / Self Care | Payer: Medicare Other | Attending: Emergency Medicine | Admitting: Emergency Medicine

## 2015-10-11 DIAGNOSIS — Y999 Unspecified external cause status: Secondary | ICD-10-CM | POA: Diagnosis not present

## 2015-10-11 DIAGNOSIS — Y939 Activity, unspecified: Secondary | ICD-10-CM | POA: Diagnosis not present

## 2015-10-11 DIAGNOSIS — W1839XA Other fall on same level, initial encounter: Secondary | ICD-10-CM | POA: Diagnosis not present

## 2015-10-11 DIAGNOSIS — I10 Essential (primary) hypertension: Secondary | ICD-10-CM | POA: Insufficient documentation

## 2015-10-11 DIAGNOSIS — Y92010 Kitchen of single-family (private) house as the place of occurrence of the external cause: Secondary | ICD-10-CM | POA: Insufficient documentation

## 2015-10-11 DIAGNOSIS — Z048 Encounter for examination and observation for other specified reasons: Secondary | ICD-10-CM | POA: Diagnosis not present

## 2015-10-11 DIAGNOSIS — W19XXXA Unspecified fall, initial encounter: Secondary | ICD-10-CM

## 2015-10-11 LAB — URINALYSIS, ROUTINE W REFLEX MICROSCOPIC
BILIRUBIN URINE: NEGATIVE
Glucose, UA: NEGATIVE mg/dL
KETONES UR: NEGATIVE mg/dL
Leukocytes, UA: NEGATIVE
Nitrite: NEGATIVE
PH: 5.5 (ref 5.0–8.0)
Protein, ur: NEGATIVE mg/dL
SPECIFIC GRAVITY, URINE: 1.016 (ref 1.005–1.030)

## 2015-10-11 LAB — BASIC METABOLIC PANEL
ANION GAP: 9 (ref 5–15)
BUN: 27 mg/dL — AB (ref 6–20)
CALCIUM: 8.3 mg/dL — AB (ref 8.9–10.3)
CO2: 26 mmol/L (ref 22–32)
Chloride: 106 mmol/L (ref 101–111)
Creatinine, Ser: 1.45 mg/dL — ABNORMAL HIGH (ref 0.44–1.00)
GFR calc Af Amer: 36 mL/min — ABNORMAL LOW (ref >60)
GFR, EST NON AFRICAN AMERICAN: 31 mL/min — AB (ref >60)
GLUCOSE: 112 mg/dL — AB (ref 65–99)
Potassium: 3.9 mmol/L (ref 3.5–5.1)
Sodium: 141 mmol/L (ref 135–145)

## 2015-10-11 LAB — CBC
HCT: 45.3 % (ref 36.0–46.0)
HEMOGLOBIN: 14.8 g/dL (ref 12.0–15.0)
MCH: 31.1 pg (ref 26.0–34.0)
MCHC: 32.7 g/dL (ref 30.0–36.0)
MCV: 95.2 fL (ref 78.0–100.0)
Platelets: 177 10*3/uL (ref 150–400)
RBC: 4.76 MIL/uL (ref 3.87–5.11)
RDW: 13.9 % (ref 11.5–15.5)
WBC: 8.5 10*3/uL (ref 4.0–10.5)

## 2015-10-11 LAB — URINE MICROSCOPIC-ADD ON

## 2015-10-11 LAB — CBG MONITORING, ED: Glucose-Capillary: 115 mg/dL — ABNORMAL HIGH (ref 65–99)

## 2015-10-11 NOTE — Telephone Encounter (Signed)
Daughter, [name] called and stated that patient fell this morning. Stated that she seems to be doing fine. Doesn't seem like she broke anything. Got herself up and went to the bathroom and ate. Patient is complaining of seeing some kind of image out the side of her eye. Instructed daughter to go ahead and take her mother to the Urgent care to be evaluated since we don't know if she hit or head or not. Daughter agreed.

## 2015-10-11 NOTE — ED Triage Notes (Signed)
Pt c/o fall after "loosing her balance" this morning at her home. Pt's daughter states patient told her that she felt "dizzy before falling". Denies dizziness, LOC and head injury. Denies pain.

## 2015-10-11 NOTE — ED Provider Notes (Signed)
  Face-to-face evaluation   History: Patient fell earlier today. She mis-stepped. Transient dizziness. No LOC  Physical exam:Alert, elderly. Normal gait. Moves all extremities easily.  Medical screening examination/treatment/procedure(s) were conducted as a shared visit with non-physician practitioner(s) and myself.  I personally evaluated the patient during the encounter    Daleen Bo, MD 10/17/15 [phone]

## 2015-10-11 NOTE — ED Provider Notes (Signed)
[place] DEPT Provider Note   CSN: [id number] Arrival date & time: 10/11/15  1212  First Provider Contact:  First MD Initiated Contact with Patient 10/11/15 1548     History   Chief Complaint Chief Complaint  Patient presents with  . Fall    HPI Lauren [name] is a 82 y.o. female.  HPI 82 y.o. female with a hx of Anxiety, HLD, presents to the Emergency Department today due to mechanical fall this AM. Pt states that she went to UC earlier today and was told to come to the ED for evaluation because she stated that she felt "dizzy" prior to falling. Currently patient asymptomatic. No dizziness. No CP/SOB/ABD pain. No headaches. No vision changes. No fevers. No gait instability. No pain currently. Pt states that she turned around quickly in the kitchen and knocked her feet together and fell on her buttocks. No head trauma. No LOC. No syncope. Pt recalls event entirely. No other symptoms noted. Of note, pt states that kitchen was slick and she was not wearing her "grip socks."    Past Medical History:  Diagnosis Date  . Anxiety   . High cholesterol   . Hypertension   . Osteoporosis   . Rosacea   . Thyroid disease   . Weight loss     Patient Active Problem List   Diagnosis Date Noted  . 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 Loss of weight 03/24/2014  . Hypertension   . High cholesterol   . Anxiety     Past Surgical History:  Procedure Laterality Date  . CHOLECYSTECTOMY  1959   [place]  . THYROIDECTOMY  2003   [place]  . TONSILLECTOMY AND ADENOIDECTOMY      OB History    No data available       Home Medications    Prior to Admission medications   Medication Sig Start Date End Date Taking? Authorizing Provider  buPROPion (WELLBUTRIN XL) 150 MG 24 hr tablet Take one tablet by mouth once daily in the morning for anxiety 03/23/15  Yes [name], NP  donepezil (ARICEPT) 10 MG tablet To take 1/2 tablet daily at bedtime for 1 month then increase to 1  tablet daily at bedtime for memory 09/21/15  Yes [name], NP  levothyroxine (SYNTHROID, LEVOTHROID) 75 MCG tablet Take 1 tablet (75 mcg total) by mouth daily before breakfast. 03/23/15  Yes [name], NP  losartan (COZAAR) 100 MG tablet Take 1 tablet (100 mg total) by mouth daily. 03/23/15  Yes [name], NP  metoprolol tartrate (LOPRESSOR) 25 MG tablet Take one tablet by mouth twice daily for blood pressure 06/21/15  Yes Tiffany L Reed, DO  mirtazapine (REMERON) 15 MG tablet Take one tablet by mouth once daily Due to weight loss to increase appetite and anxiety/depression 03/23/15  Yes [name], NP  Multiple Vitamin (MULTIVITAMIN WITH MINERALS) TABS tablet Take 1 tablet by mouth daily.   Yes Historical Provider, MD  atorvastatin (LIPITOR) 10 MG tablet Take 0.5 tablets (5 mg total) by mouth daily. Patient not taking: Reported on 10/11/2015 09/21/15   [name], NP    Family History Family History  Problem Relation Age of Onset  . Hypertension Mother   . Stroke Mother   . Stroke Father   . Lung cancer Daughter     Social History Social History  Substance Use Topics  . Smoking status: Never Smoker  . Smokeless tobacco: Never Used  . Alcohol use No  Allergies   Aspirin   Review of Systems Review of Systems ROS reviewed and all are negative for acute change except as noted in the HPI.  Physical Exam Updated Vital Signs BP 173/91 (BP Location: Left Arm)   Pulse 73   Temp 98 F (36.7 C) (Oral)   Resp 16   Ht 5\' 6"  (1.676 m)   Wt 56.7 kg   SpO2 97%   BMI 20.18 kg/m   Physical Exam  Constitutional: She is oriented to person, place, and time. Vital signs are normal. She appears well-developed and well-nourished.  HENT:  Head: Normocephalic.  Right Ear: Hearing normal.  Left Ear: Hearing normal.  Eyes: Conjunctivae and EOM are normal. Pupils are equal, round, and reactive to light.  Neck: Normal range of motion. Neck supple. No tracheal  deviation present.  Cardiovascular: Normal rate, regular rhythm, normal heart sounds and intact distal pulses.   Pulmonary/Chest: Effort normal and breath sounds normal. No respiratory distress. She has no wheezes. She has no rales. She exhibits no tenderness.  Abdominal: Soft. Bowel sounds are normal. There is no tenderness.  Musculoskeletal:  Pt able to ambulate without difficulty or changes in gait.   Neurological: She is alert and oriented to person, place, and time. She has normal strength. No cranial nerve deficit or sensory deficit. She displays a negative Romberg sign.  Finger to Nose exam unremarkable.  Cranial Nerves:  II: Pupils equal, round, reactive to light III,IV, VI: ptosis not present, extra-ocular motions intact bilaterally  V,VII: smile symmetric, facial light touch sensation equal VIII: hearing grossly normal bilaterally  IX,X: midline uvula rise  XI: bilateral shoulder shrug equal and strong XII: midline tongue extension  Skin: Skin is warm and dry.  Psychiatric: She has a normal mood and affect. Her speech is normal and behavior is normal. Thought content normal.   ED Treatments / Results  Labs (all labs ordered are listed, but only abnormal results are displayed) Labs Reviewed  BASIC METABOLIC PANEL - Abnormal; Notable for the following:       Result Value   Glucose, Bld 112 (*)    BUN 27 (*)    Creatinine, Ser 1.45 (*)    Calcium 8.3 (*)    GFR calc non Af Amer 31 (*)    GFR calc Af Amer 36 (*)    All other components within normal limits  URINALYSIS, ROUTINE W REFLEX MICROSCOPIC (NOT AT [facility]) - Abnormal; Notable for the following:    Hgb urine dipstick SMALL (*)    All other components within normal limits  URINE MICROSCOPIC-ADD ON - Abnormal; Notable for the following:    Squamous Epithelial / LPF 0-5 (*)    Bacteria, UA RARE (*)    All other components within normal limits  CBG MONITORING, ED - Abnormal; Notable for the following:     Glucose-Capillary 115 (*)    All other components within normal limits  CBC   EKG  EKG Interpretation None      Radiology No results found.  Procedures Procedures (including critical care time)  Medications Ordered in ED Medications - No data to display  Initial Impression / Assessment and Plan / ED Course  I have reviewed the triage vital signs and the nursing notes.  Pertinent labs & imaging results that were available during my care of the patient were reviewed by me and considered in my medical decision making (see chart for details).  Clinical Course   Final Clinical Impressions(s) / ED  Diagnoses  I have reviewed and evaluated the relevant laboratory values. I have interpreted the relevant EKG. I have reviewed the relevant previous healthcare records. I obtained HPI from historian. Patient discussed with supervising physician  ED Course:  Assessment: Pt is a 75yF who presents with s/p mechanical fall this AM. Sent by UC due to pt saying she was"dizzy at the time." Currently not dizzy. No symptoms at this time. No pain On exam, pt in NAD. Nontoxic/nonseptic appearing. VSS. Afebrile. Lungs CTA. Heart RRR. Abdomen nontender soft. CN evaluated and unremarkable. Pt able to ambulate without gait abnormalities. Labs unremarkable and baseline. Plan is to DC home with follow up to PCP. Pt seen and evaluated by supervising physician. At time of discharge, Patient is in no acute distress. Vital Signs are stable. Patient is able to ambulate. Patient able to tolerate PO.    Disposition/Plan:  DC Home Additional Verbal discharge instructions given and discussed with patient.  Pt Instructed to f/u with PCP in the next week for evaluation and treatment of symptoms. Return precautions given Pt acknowledges and agrees with plan  Supervising Physician Daleen Bo, MD   Final diagnoses:  Fall, initial encounter    New Prescriptions New Prescriptions   No medications on file       Shary Decamp, PA-C 10/11/15 [place], MD 10/17/15 [phone]

## 2015-10-12 NOTE — Telephone Encounter (Signed)
Lauren [name], daughter called back today and stated that the Urgent Care sent them over to the ER yesterday for them to have patient checked out. Daughter stated that everything came back fine. Daughter is wondering if they could stop the new memory medication you prescribed because she thinks that is causing her falls and sleeping issues. She has an appointment on 10/31/15 to follow up. Please Advise.

## 2015-10-12 NOTE — Telephone Encounter (Signed)
Okay to stop, keep follow up appt

## 2015-10-13 NOTE — Telephone Encounter (Signed)
Daughter stated that she thinks she is going to continue the memory medication because she is getting mixed information from her mother. Stated that her mother tells her one thing and then turns around and tells her something else.

## 2015-10-31 ENCOUNTER — Encounter: Payer: Self-pay | Admitting: Nurse Practitioner

## 2015-10-31 ENCOUNTER — Ambulatory Visit (INDEPENDENT_AMBULATORY_CARE_PROVIDER_SITE_OTHER): Payer: Medicare Other | Admitting: Nurse Practitioner

## 2015-10-31 VITALS — BP 128/78 | HR 66 | Temp 98.1°F | Resp 17 | Ht 66.0 in | Wt 125.0 lb

## 2015-10-31 DIAGNOSIS — I10 Essential (primary) hypertension: Secondary | ICD-10-CM | POA: Diagnosis not present

## 2015-10-31 DIAGNOSIS — F0390 Unspecified dementia without behavioral disturbance: Secondary | ICD-10-CM | POA: Diagnosis not present

## 2015-10-31 DIAGNOSIS — F419 Anxiety disorder, unspecified: Secondary | ICD-10-CM

## 2015-10-31 DIAGNOSIS — E7800 Pure hypercholesterolemia, unspecified: Secondary | ICD-10-CM

## 2015-10-31 MED ORDER — MEMANTINE HCL ER 7 MG PO CP24: ORAL_CAPSULE | ORAL | 0 refills | Status: DC

## 2015-10-31 MED ORDER — ATORVASTATIN CALCIUM 10 MG PO TABS: 5.0000 mg | ORAL_TABLET | Freq: Every day | ORAL | 1 refills | Status: DC
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
## 2015-10-31 NOTE — Patient Instructions (Signed)
To start namenda 7 mg daily for 1 week then increase to 14 mg daily  Follow up in  4 weeks with blood work prior to visit

## 2015-11-24 ENCOUNTER — Other Ambulatory Visit: Payer: Medicare Other

## 2015-11-24 DIAGNOSIS — E7800 Pure hypercholesterolemia, unspecified: Secondary | ICD-10-CM

## 2015-11-24 LAB — COMPLETE METABOLIC PANEL WITH GFR
ALT: 11 U/L (ref 6–29)
AST: 19 U/L (ref 10–35)
Albumin: 4.2 g/dL (ref 3.6–5.1)
Alkaline Phosphatase: 82 U/L (ref 33–130)
BILIRUBIN TOTAL: 0.9 mg/dL (ref 0.2–1.2)
BUN: 19 mg/dL (ref 7–25)
CO2: 27 mmol/L (ref 20–31)
Calcium: 8.5 mg/dL — ABNORMAL LOW (ref 8.6–10.4)
Chloride: 106 mmol/L (ref 98–110)
Creat: 1.41 mg/dL — ABNORMAL HIGH (ref 0.60–0.88)
GFR, EST NON AFRICAN AMERICAN: 33 mL/min — AB (ref >=60)
GFR, Est African American: 38 mL/min — ABNORMAL LOW (ref >=60)
GLUCOSE: 83 mg/dL (ref 65–99)
Potassium: 3.9 mmol/L (ref 3.5–5.3)
SODIUM: 144 mmol/L (ref 135–146)
TOTAL PROTEIN: 6.9 g/dL (ref 6.1–8.1)

## 2015-11-24 LAB — LIPID PANEL
CHOL/HDL RATIO: 3.8 ratio (ref <=5.0)
Cholesterol: 193 mg/dL (ref 125–200)
HDL: 51 mg/dL (ref >=46)
LDL CALC: 117 mg/dL (ref <130)
Triglycerides: 126 mg/dL (ref <150)
VLDL: 25 mg/dL (ref <30)

## 2015-11-28 ENCOUNTER — Ambulatory Visit (INDEPENDENT_AMBULATORY_CARE_PROVIDER_SITE_OTHER): Payer: Medicare Other | Admitting: Nurse Practitioner

## 2015-11-28 ENCOUNTER — Encounter: Payer: Self-pay | Admitting: Nurse Practitioner

## 2015-11-28 VITALS — BP 118/74 | HR 65 | Temp 97.9°F | Resp 18 | Ht 66.0 in | Wt 122.2 lb

## 2015-11-28 DIAGNOSIS — I10 Essential (primary) hypertension: Secondary | ICD-10-CM | POA: Diagnosis not present

## 2015-11-28 DIAGNOSIS — F0390 Unspecified dementia without behavioral disturbance: Secondary | ICD-10-CM

## 2015-11-28 DIAGNOSIS — E7800 Pure hypercholesterolemia, unspecified: Secondary | ICD-10-CM | POA: Diagnosis not present

## 2015-11-28 DIAGNOSIS — Z23 Encounter for immunization: Secondary | ICD-10-CM | POA: Diagnosis not present

## 2015-11-28 DIAGNOSIS — R634 Abnormal weight loss: Secondary | ICD-10-CM | POA: Diagnosis not present

## 2015-11-28 NOTE — Patient Instructions (Addendum)
To use nutritional supplements-- [employer] To take 2 a day if you can- take between or after meals   Your job is to eat 3 meals a day.

## 2015-11-28 NOTE — Progress Notes (Signed)
Careteam: Patient Care Team: Lauren Chandler, NP as PCP - General (Nurse Practitioner)  Advanced Directive information Does patient have an advance directive?: Yes, Type of Advance Directive: Living will  Allergies  Allergen Reactions  . Aricept [Donepezil Hcl]   . Aspirin     Upset stomach     Chief Complaint  Patient presents with  . Medical Management of Chronic Issues    4 week follow up. Pt wants flu vaccine  . Other    Lauren [name] in room.     HPI: Patient is a 82 y.o. female seen in the office today to follow up cholesterol and namenda start.   Tolerating lipitor no side effects. LDL improved 117  Weight loss- down to 122 lbs does not like supplements. Would like something berry per Lauren [name], ensures are too thick  Dementia- taking Namenda which she has done well with. Lauren [name] reports she is happy with the results of being on it. Functioning better on medication.    Review of Systems:  Review of Systems  Constitutional: Negative for activity change, appetite change, fatigue and unexpected weight change.  HENT: Negative for congestion, dental problem and trouble swallowing.   Eyes: Negative.   Respiratory: Negative for cough and shortness of breath.   Cardiovascular: Negative for chest pain and palpitations.  Gastrointestinal: Negative for blood in stool, constipation and diarrhea.  Neurological: Negative for dizziness.  Psychiatric/Behavioral: Positive for confusion. Negative for agitation and behavioral problems. The patient is not nervous/anxious.     Past Medical History:  Diagnosis Date  . Anxiety   . High cholesterol   . Hypertension   . Osteoporosis   . Rosacea   . Thyroid disease   . Weight loss    Past Surgical History:  Procedure Laterality Date  . CHOLECYSTECTOMY  1959   [place]  . THYROIDECTOMY  2003   [place]  . TONSILLECTOMY AND ADENOIDECTOMY     Social History:   reports that she has never smoked. She has never used smokeless  tobacco. She reports that she does not drink alcohol or use drugs.  Family History  Problem Relation Age of Onset  . Hypertension Mother   . Stroke Mother   . Stroke Father   . Lung cancer Lauren [name]     Medications: Patient's Medications  New Prescriptions   No medications on file  Previous Medications   ATORVASTATIN (LIPITOR) 10 MG TABLET    Take 0.5 tablets (5 mg total) by mouth daily.   BUPROPION (WELLBUTRIN XL) 150 MG 24 HR TABLET    Take one tablet by mouth once daily in the morning for anxiety   LEVOTHYROXINE (SYNTHROID, LEVOTHROID) 75 MCG TABLET    Take 1 tablet (75 mcg total) by mouth daily before breakfast.   LOSARTAN (COZAAR) 100 MG TABLET    Take 1 tablet (100 mg total) by mouth daily.   MEMANTINE (NAMENDA XR) 14 MG CP24 24 HR CAPSULE    Take 14 mg by mouth daily.   METOPROLOL TARTRATE (LOPRESSOR) 25 MG TABLET    Take one tablet by mouth twice daily for blood pressure   MIRTAZAPINE (REMERON) 15 MG TABLET    Take one tablet by mouth once daily Due to weight loss to increase appetite and anxiety/depression   MULTIPLE VITAMIN (MULTIVITAMIN WITH MINERALS) TABS TABLET    Take 2 tablets by mouth daily.   Modified Medications   No medications on file  Discontinued Medications   MEMANTINE (NAMENDA XR) 7 MG CP24 24  HR CAPSULE    To take namenda 1 tablet 7 mg daily for 1 week then increase to 2 tablets 14 mg daily for memory     Physical Exam:  Vitals:   11/28/15 1528  BP: 118/74  Pulse: 65  Resp: 18  Temp: 97.9 F (36.6 C)  TempSrc: Oral  SpO2: 95%  Weight: 122 lb 3.2 oz (55.4 kg)  Height: 5\' 6"  (1.676 m)   Body mass index is 19.72 kg/m.  Physical Exam  Constitutional: She is oriented to person, place, and time. No distress.  Frail thin female  HENT:  Head: Normocephalic and atraumatic.  Neck: Normal range of motion. Neck supple.  Cardiovascular: Normal rate, regular rhythm and normal heart sounds.   Pulmonary/Chest: Effort normal and breath sounds normal. No  respiratory distress.  Abdominal: Soft. Bowel sounds are normal. She exhibits no distension.  Musculoskeletal: Normal range of motion. She exhibits no edema.  Neurological: She is oriented to person, place, and time.  Skin: Skin is warm and dry.  Psychiatric: She has a normal mood and affect.    Labs reviewed: Basic Metabolic Panel:  Recent Labs  03/23/15 1514 09/18/15 1022 10/11/15 1306 11/24/15 0947  NA 143 145* 141 144  K 3.9 4.2 3.9 3.9  CL 102 103 106 106  CO2 23 25 26 27   GLUCOSE 108* 78 112* 83  BUN 25 16 27* 19  CREATININE 1.22* 1.40* 1.45* 1.41*  CALCIUM 7.8* 7.7* 8.3* 8.5*  TSH 3.230  --   --   --    Liver Function Tests:  Recent Labs  09/18/15 1022 11/24/15 0947  AST 21 19  ALT 16 11  ALKPHOS 123* 82  BILITOT 0.6 0.9  PROT 6.5 6.9  ALBUMIN 3.9 4.2   No results for input(s): LIPASE, AMYLASE in the last 8760 hours. No results for input(s): AMMONIA in the last 8760 hours. CBC:  Recent Labs  03/03/15 1635 09/18/15 1022 10/11/15 1306  WBC 8.4 6.1 8.5  NEUTROABS  --  3.9  --   HGB 14.2  --  14.8  HCT 43.1 43.9 45.3  MCV 96.6 96 95.2  PLT 170 153 177   Lipid Panel:  Recent Labs  09/18/15 1022 11/24/15 0947  CHOL 251* 193  HDL 51 51  LDLCALC 178* 117  TRIG 112 126  CHOLHDL 4.9* 3.8   TSH:  Recent Labs  03/23/15 1514  TSH 3.230   A1C: Lab Results  Component Value Date   HGBA1C (H) 11/23/2009    5.7 (NOTE)                                                                       According to the ADA Clinical Practice Recommendations for 2011, when HbA1c is used as a screening test:   >=6.5%   Diagnostic of Diabetes Mellitus           (if abnormal result  is confirmed)  5.7-6.4%   Increased risk of developing Diabetes Mellitus  References:Diagnosis and Classification of Diabetes Mellitus,Diabetes POEU,2353,61(WERXV 1):S62-S69 and Standards of Medical Care in         Diabetes - 2011,Diabetes Care,2011,34  (Suppl 1):S11-S61.      Assessment/Plan 1. Encounter for immunization - Flu Vaccine  QUAD 36+ mos IM  2. Dementia, without behavioral disturbance Tolerating namenda and doing well at this time. conts on namenda 14 mg daily   3. High cholesterol Improved on Lipitor   4. Essential hypertension Blood pressure well controlled on cozaar and metoprolol BID  5. Loss of weight Ongoing weight loss, to liberalize diet. To use resource boost breeze BID between meals or after a meal Encouraged 3 meals a day    Lauren [name] K. Harle Battiest  [facility] & Adult Medicine [phone] 8 am - 5 pm) [phone] (after hours)

## 2015-11-30 ENCOUNTER — Other Ambulatory Visit: Payer: Self-pay | Admitting: Nurse Practitioner

## 2015-11-30 DIAGNOSIS — F0390 Unspecified dementia without behavioral disturbance: Secondary | ICD-10-CM

## 2015-12-04 ENCOUNTER — Telehealth: Payer: Self-pay | Admitting: *Deleted

## 2015-12-04 MED ORDER — MEMANTINE HCL ER 14 MG PO CP24: 14.0000 mg | ORAL_CAPSULE | Freq: Every day | ORAL | 3 refills | Status: DC

## 2015-12-04 NOTE — Telephone Encounter (Signed)
Yes she is to cont

## 2015-12-04 NOTE — Telephone Encounter (Signed)
Patient daughter notified and agreed. Faxed medication to pharmacy

## 2015-12-04 NOTE — Telephone Encounter (Signed)
Patient daughter, [name] called and stated that she tried to get patient's Namenda XR 14mg  refilled and it was denied. She wants to make sure she is suppose to be still taking only this one for her memory. I told her that it was in her current medication list. She wants to make sure with you. Please Advise.

## 2015-12-28 ENCOUNTER — Telehealth: Payer: Self-pay

## 2015-12-28 NOTE — Telephone Encounter (Signed)
Patient's daughter called to get recommendations from [facility] on which department patient should be placed in at a facility.  Assisted living vs independent living  Patient is able to clothe, feed and bathe herself. Patient fills her own pill box. Yet at times she seems like she would not be able to do all these things and that leaves Cathy in a uncomfortable with making the decision.   Please advise

## 2015-12-29 ENCOUNTER — Encounter: Payer: Self-pay | Admitting: Nurse Practitioner

## 2015-12-29 NOTE — Telephone Encounter (Signed)
Left message on voicemail for [facility] with Jessica's response

## 2015-12-29 NOTE — Telephone Encounter (Signed)
If she is worried about medication and dressing and bathing assisted living would be a better option

## 2016-01-03 NOTE — Progress Notes (Signed)
This encounter was created in error - please disregard.

## 2016-01-11 ENCOUNTER — Ambulatory Visit (INDEPENDENT_AMBULATORY_CARE_PROVIDER_SITE_OTHER): Payer: Medicare Other | Admitting: Nurse Practitioner

## 2016-01-11 ENCOUNTER — Encounter: Payer: Self-pay | Admitting: Nurse Practitioner

## 2016-01-11 VITALS — BP 156/90 | HR 74 | Temp 97.9°F | Resp 12 | Ht 66.0 in | Wt 123.4 lb

## 2016-01-11 DIAGNOSIS — R413 Other amnesia: Secondary | ICD-10-CM | POA: Diagnosis not present

## 2016-01-11 DIAGNOSIS — R634 Abnormal weight loss: Secondary | ICD-10-CM

## 2016-01-11 DIAGNOSIS — M810 Age-related osteoporosis without current pathological fracture: Secondary | ICD-10-CM | POA: Diagnosis not present

## 2016-01-11 DIAGNOSIS — I10 Essential (primary) hypertension: Secondary | ICD-10-CM | POA: Diagnosis not present

## 2016-01-11 DIAGNOSIS — F32A Depression, unspecified: Secondary | ICD-10-CM

## 2016-01-11 DIAGNOSIS — F418 Other specified anxiety disorders: Secondary | ICD-10-CM

## 2016-01-11 DIAGNOSIS — F329 Major depressive disorder, single episode, unspecified: Secondary | ICD-10-CM

## 2016-01-11 DIAGNOSIS — F419 Anxiety disorder, unspecified: Secondary | ICD-10-CM

## 2016-01-11 MED ORDER — CALCIUM CARBONATE-VITAMIN D 600-400 MG-UNIT PO TABS: 1.0000 | ORAL_TABLET | Freq: Two times a day (BID) | ORAL | Status: DC

## 2016-01-11 MED ORDER — MEMANTINE HCL ER 14 MG PO CP24: 14.0000 mg | ORAL_CAPSULE | Freq: Every day | ORAL | 3 refills | Status: DC

## 2016-01-11 NOTE — Progress Notes (Signed)
Careteam: Patient Care Team: [name], NP as PCP - General (Nurse Practitioner)  Advanced Directive information    Allergies  Allergen Reactions  . Aricept [Donepezil Hcl]   . Aspirin     Upset stomach     Chief Complaint  Patient presents with  . Follow-up    Form completion   . Medical Management of Chronic Issues    2 month follow-up, memory rx is expensive- need to discuss      HPI: Patient is a 82 y.o. female seen in the office today to discuss moving into assisted living and 2 month follow up.  Daughter met with SW and she is appropriate for independent living at [facility]. Needs form filled out today. Pt seen 6 weeks ago, she has been placed on namenda 14 mg XR.  Diagnosis of osteoporosis however thought she would just take calcium and vit d for this but is not even taking supplements. Hx of pelvic fracture.   Review of Systems:  Review of Systems  Constitutional: Negative for activity change, appetite change, fatigue and unexpected weight change.  HENT: Positive for hearing loss. Negative for congestion, dental problem and trouble swallowing.   Eyes: Negative.   Respiratory: Negative for cough and shortness of breath.   Cardiovascular: Negative for chest pain and palpitations.  Gastrointestinal: Negative for blood in stool, constipation and diarrhea.  Neurological: Negative for dizziness.  Psychiatric/Behavioral: Positive for confusion. Negative for agitation and behavioral problems. The patient is not nervous/anxious.     Past Medical History:  Diagnosis Date  . Anxiety   . High cholesterol   . Hypertension   . Osteoporosis   . Rosacea   . Thyroid disease   . Weight loss    Past Surgical History:  Procedure Laterality Date  . CHOLECYSTECTOMY  1959   [place]  . THYROIDECTOMY  2003   [place]  . TONSILLECTOMY AND ADENOIDECTOMY     Social History:   reports that she has never smoked. She has never used smokeless tobacco. She reports  that she does not drink alcohol or use drugs.  Family History  Problem Relation Age of Onset  . Hypertension Mother   . Stroke Mother   . Stroke Father   . Lung cancer Daughter     Medications: Patient's Medications  New Prescriptions   No medications on file  Previous Medications   ATORVASTATIN (LIPITOR) 10 MG TABLET    Take 0.5 tablets (5 mg total) by mouth daily.   BUPROPION (WELLBUTRIN XL) 150 MG 24 HR TABLET    Take one tablet by mouth once daily in the morning for anxiety   LEVOTHYROXINE (SYNTHROID, LEVOTHROID) 75 MCG TABLET    Take 1 tablet (75 mcg total) by mouth daily before breakfast.   LOSARTAN (COZAAR) 100 MG TABLET    Take 1 tablet (100 mg total) by mouth daily.   MEMANTINE (NAMENDA XR) 14 MG CP24 24 HR CAPSULE    Take 1 capsule (14 mg total) by mouth daily. For memory   METOPROLOL TARTRATE (LOPRESSOR) 25 MG TABLET    Take one tablet by mouth twice daily for blood pressure   MIRTAZAPINE (REMERON) 15 MG TABLET    Take one tablet by mouth once daily Due to weight loss to increase appetite and anxiety/depression   MULTIPLE VITAMIN (MULTIVITAMIN WITH MINERALS) TABS TABLET    Take 2 tablets by mouth daily.   Modified Medications   No medications on file  Discontinued Medications  No medications on file     Physical Exam:  Vitals:   01/11/16 1448  BP: (!) 156/90  Pulse: 74  Resp: 12  Temp: 97.9 F (36.6 C)  TempSrc: Oral  SpO2: 98%  Weight: 123 lb 6.4 oz (56 kg)  Height: '5\' 6"'  (1.676 m)   Body mass index is 19.92 kg/m.  Physical Exam  Constitutional: She is oriented to person, place, and time. No distress.  Frail thin female  HENT:  Head: Normocephalic and atraumatic.  Neck: Normal range of motion. Neck supple.  Cardiovascular: Normal rate, regular rhythm and normal heart sounds.   Pulmonary/Chest: Effort normal and breath sounds normal. No respiratory distress.  Abdominal: Soft. Bowel sounds are normal. She exhibits no distension.  Musculoskeletal:  Normal range of motion. She exhibits no edema.  Neurological: She is oriented to person, place, and time.  Skin: Skin is warm and dry.  Psychiatric: She has a normal mood and affect.    Labs reviewed: Basic Metabolic Panel:  Recent Labs  03/23/15 1514 09/18/15 1022 10/11/15 1306 11/24/15 0947  NA 143 145* 141 144  K 3.9 4.2 3.9 3.9  CL 102 103 106 106  CO2 '23 25 26 27  ' GLUCOSE 108* 78 112* 83  BUN 25 16 27* 19  CREATININE 1.22* 1.40* 1.45* 1.41*  CALCIUM 7.8* 7.7* 8.3* 8.5*  TSH 3.230  --   --   --    Liver Function Tests:  Recent Labs  09/18/15 1022 11/24/15 0947  AST 21 19  ALT 16 11  ALKPHOS 123* 82  BILITOT 0.6 0.9  PROT 6.5 6.9  ALBUMIN 3.9 4.2   No results for input(s): LIPASE, AMYLASE in the last 8760 hours. No results for input(s): AMMONIA in the last 8760 hours. CBC:  Recent Labs  03/03/15 1635 09/18/15 1022 10/11/15 1306  WBC 8.4 6.1 8.5  NEUTROABS  --  3.9  --   HGB 14.2  --  14.8  HCT 43.1 43.9 45.3  MCV 96.6 96 95.2  PLT 170 153 177   Lipid Panel:  Recent Labs  09/18/15 1022 11/24/15 0947  CHOL 251* 193  HDL 51 51  LDLCALC 178* 117  TRIG 112 126  CHOLHDL 4.9* 3.8   TSH:  Recent Labs  03/23/15 1514  TSH 3.230   A1C: Lab Results  Component Value Date   HGBA1C (H) 11/23/2009    5.7 (NOTE)                                                                       According to the ADA Clinical Practice Recommendations for 2011, when HbA1c is used as a screening test:   >=6.5%   Diagnostic of Diabetes Mellitus           (if abnormal result  is confirmed)  5.7-6.4%   Increased risk of developing Diabetes Mellitus  References:Diagnosis and Classification of Diabetes Mellitus,Diabetes XTGG,2694,85(IOEVO 1):S62-S69 and Standards of Medical Care in         Diabetes - 2011,Diabetes Care,2011,34  (Suppl 1):S11-S61.     Assessment/Plan 1. Memory loss -stable, daughter seeing benefit from namenda. Would like Rx sent to pharmacy for 90 day  supply.  - memantine (NAMENDA XR) 14 MG CP24 24  hr capsule; Take 1 capsule (14 mg total) by mouth daily. For memory  Dispense: 90 capsule; Refill: 3  2. Loss of weight Weight is stable, to cont on remeron   3. Anxiety and depression Improved on current regimen, conts on Wellbutrin   4. Osteoporosis, unspecified osteoporosis type, unspecified pathological fracture presence -discussed starting prolia, and will fill out form to verify insurance.  -In the meantime for her to start - Calcium Carbonate-Vitamin D (CALTRATE 600+D) 600-400 MG-UNIT tablet; Take 1 tablet by mouth 2 (two) times daily. - BMP with eGFR; Future  5. Essential hypertension Improved on recheck, will cont current regimen  - BMP with eGFR; Future  Form completed for independent facility.  Follow up in 3 months with labs prior to visit   Chenelle Benning K. Harle Battiest  [facility] [phone] 8 am - 5 pm) [phone] (after hours)

## 2016-01-11 NOTE — Patient Instructions (Addendum)
Recommended to take caltrate with D 600/400 twice a day.   To call human resources and have them fax the information to [place] to add to your records

## 2016-01-23 ENCOUNTER — Telehealth: Payer: Self-pay

## 2016-01-23 NOTE — Telephone Encounter (Signed)
Spoke with daughter [name] and at this time she is not interested in the Prolia injection. She stated that she will discuss this more at her appointment in April.

## 2016-02-01 ENCOUNTER — Ambulatory Visit: Payer: Medicare Other | Admitting: Nurse Practitioner

## 2016-02-15 ENCOUNTER — Telehealth: Payer: Self-pay | Admitting: Nurse Practitioner

## 2016-02-16 NOTE — Telephone Encounter (Signed)
I was not here yesterday 11-30. I did not receive form. I checked Jessica's Folder and form was placed there for her to review and sign.

## 2016-02-23 NOTE — Telephone Encounter (Signed)
[facility] Form signed and daughter notified to pick up

## 2016-02-29 ENCOUNTER — Other Ambulatory Visit: Payer: Self-pay | Admitting: Nurse Practitioner

## 2016-02-29 DIAGNOSIS — R634 Abnormal weight loss: Secondary | ICD-10-CM

## 2016-02-29 DIAGNOSIS — F419 Anxiety disorder, unspecified: Secondary | ICD-10-CM

## 2016-02-29 DIAGNOSIS — F32A Depression, unspecified: Secondary | ICD-10-CM

## 2016-02-29 DIAGNOSIS — F329 Major depressive disorder, single episode, unspecified: Secondary | ICD-10-CM

## 2016-03-14 ENCOUNTER — Other Ambulatory Visit: Payer: Self-pay | Admitting: Nurse Practitioner

## 2016-03-14 DIAGNOSIS — F329 Major depressive disorder, single episode, unspecified: Secondary | ICD-10-CM

## 2016-03-14 DIAGNOSIS — F32A Depression, unspecified: Secondary | ICD-10-CM

## 2016-03-14 DIAGNOSIS — F419 Anxiety disorder, unspecified: Principal | ICD-10-CM

## 2016-04-12 ENCOUNTER — Other Ambulatory Visit: Payer: Medicare HMO

## 2016-04-12 DIAGNOSIS — M810 Age-related osteoporosis without current pathological fracture: Secondary | ICD-10-CM | POA: Diagnosis not present

## 2016-04-12 DIAGNOSIS — I10 Essential (primary) hypertension: Secondary | ICD-10-CM | POA: Diagnosis not present

## 2016-04-13 LAB — BASIC METABOLIC PANEL WITH GFR
BUN: 21 mg/dL (ref 7–25)
CHLORIDE: 109 mmol/L (ref 98–110)
CO2: 22 mmol/L (ref 20–31)
Calcium: 7.5 mg/dL — ABNORMAL LOW (ref 8.6–10.4)
Creat: 1.26 mg/dL — ABNORMAL HIGH (ref 0.60–0.88)
GFR, Est African American: 43 mL/min — ABNORMAL LOW (ref >=60)
GFR, Est Non African American: 38 mL/min — ABNORMAL LOW (ref >=60)
Glucose, Bld: 80 mg/dL (ref 65–99)
POTASSIUM: 3.8 mmol/L (ref 3.5–5.3)
SODIUM: 146 mmol/L (ref 135–146)

## 2016-04-16 ENCOUNTER — Encounter: Payer: Self-pay | Admitting: Nurse Practitioner

## 2016-04-16 ENCOUNTER — Ambulatory Visit (INDEPENDENT_AMBULATORY_CARE_PROVIDER_SITE_OTHER): Payer: Medicare HMO | Admitting: Nurse Practitioner

## 2016-04-16 VITALS — BP 148/88 | HR 67 | Temp 97.6°F | Ht 66.0 in | Wt 130.4 lb

## 2016-04-16 DIAGNOSIS — R635 Abnormal weight gain: Secondary | ICD-10-CM

## 2016-04-16 DIAGNOSIS — I10 Essential (primary) hypertension: Secondary | ICD-10-CM | POA: Diagnosis not present

## 2016-04-16 DIAGNOSIS — F0150 Vascular dementia without behavioral disturbance: Secondary | ICD-10-CM

## 2016-04-16 DIAGNOSIS — E039 Hypothyroidism, unspecified: Secondary | ICD-10-CM

## 2016-04-16 DIAGNOSIS — R600 Localized edema: Secondary | ICD-10-CM | POA: Diagnosis not present

## 2016-04-16 DIAGNOSIS — R69 Illness, unspecified: Secondary | ICD-10-CM | POA: Diagnosis not present

## 2016-04-16 NOTE — Progress Notes (Signed)
Careteam: Patient Care Team: Lauren Chandler, NP as PCP - General (Nurse Practitioner)  Advanced Directive information    Allergies  Allergen Reactions  . Aricept [Donepezil Hcl]   . Aspirin     Upset stomach     Chief Complaint  Patient presents with  . Medical Management of Chronic Issues    3 Month Follow up with labs     HPI: Patient is a 83 y.o. female seen in the office today for medical management of chronic diseases. She has recently moved into an independent living facility since her last visit. She admits to adjusting well to this move. She reports increases in weight since her move with more intake and satisfaction.  Admits to non-painful right greater than left foot swelling with no history of trauma or fall. No redness, pain, or other discoloration, fevers, chills, history of gout, or arthritis. Appears to be dependent in nature with worsening symptoms at the end of the day. No SOB or dyspnea on exertion.   Osteoporosis- -Pt's calcium has worsened since her last visit. She has gone from 8.5 to 7.5. Taking calcium supplements  HTN-BP today 148/88  Vascular dementia- Pt was started on Namenda and doing well.    Review of Systems:  Review of Systems  Constitutional: Negative for activity change, appetite change, chills, fatigue and unexpected weight change.  HENT: Negative for postnasal drip and rhinorrhea.   Eyes: Negative for discharge, redness and itching.  Respiratory: Negative for cough, chest tightness and shortness of breath.   Cardiovascular: Positive for leg swelling. Negative for chest pain and palpitations.       Mild right greater than right foot swelling, 1+  Gastrointestinal: Negative for abdominal pain, constipation, diarrhea, nausea and vomiting.  Genitourinary: Positive for frequency and urgency. Negative for difficulty urinating, hematuria and pelvic pain.  Musculoskeletal: Negative.   Neurological: Negative.   Psychiatric/Behavioral:  Negative.        Memory loss    Past Medical History:  Diagnosis Date  . Anxiety   . High cholesterol   . Hypertension   . Osteoporosis   . Rosacea   . Thyroid disease   . Weight loss    Past Surgical History:  Procedure Laterality Date  . CHOLECYSTECTOMY  1959   [place]  . THYROIDECTOMY  2003   [place]  . TONSILLECTOMY AND ADENOIDECTOMY     Social History:   reports that she has never smoked. She has never used smokeless tobacco. She reports that she does not drink alcohol or use drugs.  Family History  Problem Relation Age of Onset  . Hypertension Mother   . Stroke Mother   . Stroke Father   . Lung cancer Daughter     Medications: Patient's Medications  New Prescriptions   No medications on file  Previous Medications   ATORVASTATIN (LIPITOR) 10 MG TABLET    Take 0.5 tablets (5 mg total) by mouth daily.   BUPROPION (WELLBUTRIN XL) 150 MG 24 HR TABLET    TAKE 1 TABLET DAILY IN THE MORNING FOR ANXIETY   CALCIUM CARBONATE-VITAMIN D (CALTRATE 600+D) 600-400 MG-UNIT TABLET    Take 1 tablet by mouth 2 (two) times daily.   LEVOTHYROXINE (SYNTHROID, LEVOTHROID) 75 MCG TABLET    Take 1 tablet (75 mcg total) by mouth daily before breakfast.   LOSARTAN (COZAAR) 100 MG TABLET    Take 1 tablet (100 mg total) by mouth daily.   MEMANTINE (NAMENDA XR) 14 MG CP24 24 HR  CAPSULE    Take 1 capsule (14 mg total) by mouth daily. For memory   METOPROLOL TARTRATE (LOPRESSOR) 25 MG TABLET    Take one tablet by mouth twice daily for blood pressure   MIRTAZAPINE (REMERON) 15 MG TABLET    TAKE 1 TABLET ONCE DAILY DUE TO WEIGHT LOSS TO INCREASE APPETITE AND ANXIETY, DEPRESSION   MULTIPLE VITAMIN (MULTIVITAMIN WITH MINERALS) TABS TABLET    Take 2 tablets by mouth daily.   Modified Medications   No medications on file  Discontinued Medications   No medications on file     Physical Exam:  Vitals:   04/16/16 1250  BP: (!) 148/88  Pulse: 67  Temp: 97.6 F (36.4 C)  TempSrc: Oral    Weight: 130 lb 6.4 oz (59.1 kg)  Height: 5\' 6"  (1.676 m)   Body mass index is 21.05 kg/m.  Physical Exam  Constitutional: She is oriented to person, place, and time. She appears well-developed and well-nourished. No distress.  Cardiovascular: Normal rate, regular rhythm, normal heart sounds and intact distal pulses.   No murmur heard. Pulmonary/Chest: Effort normal and breath sounds normal. No respiratory distress. She has no wheezes.  Abdominal: Soft. Bowel sounds are normal. She exhibits no distension. There is no tenderness.  Neurological: She is alert and oriented to person, place, and time.  Skin: Skin is warm and dry.  Psychiatric: She has a normal mood and affect.    Labs reviewed: Basic Metabolic Panel:  Recent Labs  10/11/15 1306 11/24/15 0947 04/12/16 0907  NA 141 144 146  K 3.9 3.9 3.8  CL 106 106 109  CO2 26 27 22   GLUCOSE 112* 83 80  BUN 27* 19 21  CREATININE 1.45* 1.41* 1.26*  CALCIUM 8.3* 8.5* 7.5*   Liver Function Tests:  Recent Labs  09/18/15 1022 11/24/15 0947  AST 21 19  ALT 16 11  ALKPHOS 123* 82  BILITOT 0.6 0.9  PROT 6.5 6.9  ALBUMIN 3.9 4.2   No results for input(s): LIPASE, AMYLASE in the last 8760 hours. No results for input(s): AMMONIA in the last 8760 hours. CBC:  Recent Labs  09/18/15 1022 10/11/15 1306  WBC 6.1 8.5  NEUTROABS 3.9  --   HGB  --  14.8  HCT 43.9 45.3  MCV 96 95.2  PLT 153 177   Lipid Panel:  Recent Labs  09/18/15 1022 11/24/15 0947  CHOL 251* 193  HDL 51 51  LDLCALC 178* 117  TRIG 112 126  CHOLHDL 4.9* 3.8   TSH: No results for input(s): TSH in the last 8760 hours. A1C: Lab Results  Component Value Date   HGBA1C (H) 11/23/2009    5.7 (NOTE)                                                                       According to the ADA Clinical Practice Recommendations for 2011, when HbA1c is used as a screening test:   >=6.5%   Diagnostic of Diabetes Mellitus           (if abnormal result  is  confirmed)  5.7-6.4%   Increased risk of developing Diabetes Mellitus  References:Diagnosis and Classification of Diabetes Mellitus,Diabetes YNWG,9562,13(YQMVH 1):S62-S69 and Standards of Medical Care in  Diabetes - 2011,Diabetes Care,2011,34  (Suppl 1):S11-S61.     Assessment/Plan 1. Essential hypertension -BP stable today. Continue taking lopressor and cozaar.  -Continue lifestyle modifications with diet and exercise within new facility  2. Vascular dementia without behavioral disturbance -Stable. Pt started on Namenda 12/2015 with improvements in memory.   3. Hypocalcemia -Worsening. Pt has worsening hypocalcemia since 4 months ago. Was stable at 8.5 and has now decreased to 7.5.  -Educated daughter and pt on the importance of taking calcium supplementation. Calcium carbonate/Vit D twice daily.  -Will check a further labs today. PTH, Intact and Calcium ionized, and Vit D-25-hydroxy -Depending on results, may need further evaluation  4. Hypothyroidism, unspecified type -Pt remains on synthroid. -Will check TSH level today  5. Weight, gain -Stabilizing. Weight has increased from 122lb to 130lb with her new residency. Reports increased in diet intake and satisfaction.   7. Foot swelling, right -due to venous insufficiency and legs being dependant. Educated pt to elevate feet at night at the end of the day to relieve swelling.   Follow up in 3 months, sooner if needed   Lauren [name] K. Harle Battiest  [facility] [phone] 8 am - 5 pm) [phone] (after hours)

## 2016-04-16 NOTE — Patient Instructions (Signed)
Will get more labs to evaluate low calcium level Cont to take caltrate with D 600/400 twice a day.

## 2016-04-17 LAB — CALCIUM, IONIZED: CALCIUM ION: 4.4 mg/dL — AB (ref 4.8–5.6)

## 2016-04-17 LAB — VITAMIN D 25 HYDROXY (VIT D DEFICIENCY, FRACTURES): VIT D 25 HYDROXY: 30 ng/mL (ref 30–100)

## 2016-04-18 LAB — TSH: TSH: 9.19 mIU/L — ABNORMAL HIGH

## 2016-04-18 LAB — PTH, INTACT AND CALCIUM
Calcium: 7.7 mg/dL — ABNORMAL LOW (ref 8.6–10.4)
PTH: 56 pg/mL (ref 14–64)

## 2016-04-22 ENCOUNTER — Other Ambulatory Visit: Payer: Self-pay | Admitting: Nurse Practitioner

## 2016-04-22 DIAGNOSIS — E890 Postprocedural hypothyroidism: Secondary | ICD-10-CM

## 2016-04-23 ENCOUNTER — Other Ambulatory Visit: Payer: Self-pay | Admitting: Internal Medicine

## 2016-04-23 ENCOUNTER — Other Ambulatory Visit: Payer: Self-pay | Admitting: Nurse Practitioner

## 2016-04-23 MED ORDER — LEVOTHYROXINE SODIUM 88 MCG PO TABS: 88.0000 ug | ORAL_TABLET | Freq: Every day | ORAL | 2 refills | Status: DC

## 2016-04-23 NOTE — Addendum Note (Signed)
Addended by: Denyse Amass on: 04/23/2016 03:28 PM   Modules accepted: Orders

## 2016-05-21 ENCOUNTER — Other Ambulatory Visit: Payer: Self-pay | Admitting: Nurse Practitioner

## 2016-05-21 DIAGNOSIS — I10 Essential (primary) hypertension: Secondary | ICD-10-CM

## 2016-05-22 DIAGNOSIS — E209 Hypoparathyroidism, unspecified: Secondary | ICD-10-CM | POA: Diagnosis not present

## 2016-05-22 DIAGNOSIS — N183 Chronic kidney disease, stage 3 (moderate): Secondary | ICD-10-CM | POA: Diagnosis not present

## 2016-05-23 DIAGNOSIS — N390 Urinary tract infection, site not specified: Secondary | ICD-10-CM | POA: Diagnosis not present

## 2016-06-06 ENCOUNTER — Other Ambulatory Visit: Payer: Self-pay

## 2016-06-06 DIAGNOSIS — E039 Hypothyroidism, unspecified: Secondary | ICD-10-CM

## 2016-06-13 ENCOUNTER — Other Ambulatory Visit: Payer: Medicare HMO

## 2016-06-13 DIAGNOSIS — E039 Hypothyroidism, unspecified: Secondary | ICD-10-CM

## 2016-06-13 LAB — TSH: TSH: 8.69 mIU/L — ABNORMAL HIGH

## 2016-06-18 ENCOUNTER — Other Ambulatory Visit: Payer: Self-pay

## 2016-06-18 DIAGNOSIS — E039 Hypothyroidism, unspecified: Secondary | ICD-10-CM

## 2016-06-18 MED ORDER — LEVOTHYROXINE SODIUM 100 MCG PO TABS: 100.0000 ug | ORAL_TABLET | Freq: Every day | ORAL | 0 refills | Status: DC

## 2016-06-20 DIAGNOSIS — N183 Chronic kidney disease, stage 3 (moderate): Secondary | ICD-10-CM | POA: Diagnosis not present

## 2016-06-24 DIAGNOSIS — N183 Chronic kidney disease, stage 3 (moderate): Secondary | ICD-10-CM | POA: Diagnosis not present

## 2016-07-08 ENCOUNTER — Other Ambulatory Visit: Payer: Self-pay | Admitting: *Deleted

## 2016-07-08 DIAGNOSIS — N390 Urinary tract infection, site not specified: Secondary | ICD-10-CM | POA: Diagnosis not present

## 2016-07-08 DIAGNOSIS — I10 Essential (primary) hypertension: Secondary | ICD-10-CM | POA: Diagnosis not present

## 2016-07-08 DIAGNOSIS — N183 Chronic kidney disease, stage 3 (moderate): Secondary | ICD-10-CM | POA: Diagnosis not present

## 2016-07-08 DIAGNOSIS — E209 Hypoparathyroidism, unspecified: Secondary | ICD-10-CM | POA: Diagnosis not present

## 2016-07-08 MED ORDER — LEVOTHYROXINE SODIUM 100 MCG PO TABS: 100.0000 ug | ORAL_TABLET | Freq: Every day | ORAL | 0 refills | Status: DC

## 2016-07-08 NOTE — Telephone Encounter (Signed)
[name], daughter called and requested a RF on Thyroid medication. Stated patient only had 3 pills left. Confirmed repeat labwork for 5/24.

## 2016-07-09 ENCOUNTER — Telehealth: Payer: Self-pay | Admitting: Nurse Practitioner

## 2016-07-09 NOTE — Telephone Encounter (Signed)
[name], daughter of patient called stating that the patient has recently seen her kidney specialist and was recommended that she be put on a Vitamin D medication for the rest of her life. Patient was prescribed medication through their office and was told either they can refill the medication there or see if [facility].   [name] would rather our office refill the prescription since we handle all the other medications for patient.  Records were suppose to be sent over from kidney specialist with more detail.   Called Cathy back to get the dosage of the Vitamin- D medication and she did not know it. She stated that she would call back with further information (dosage) regarding the medication.

## 2016-07-16 ENCOUNTER — Other Ambulatory Visit: Payer: Self-pay | Admitting: *Deleted

## 2016-07-16 ENCOUNTER — Ambulatory Visit: Payer: Medicare HMO | Admitting: Nurse Practitioner

## 2016-07-17 ENCOUNTER — Other Ambulatory Visit: Payer: Self-pay | Admitting: Nurse Practitioner

## 2016-07-20 ENCOUNTER — Other Ambulatory Visit: Payer: Self-pay | Admitting: Internal Medicine

## 2016-07-23 MED ORDER — VITAMIN D (ERGOCALCIFEROL) 1.25 MG (50000 UNIT) PO CAPS: 50000.0000 [IU] | ORAL_CAPSULE | ORAL | 4 refills | Status: DC

## 2016-07-23 NOTE — Telephone Encounter (Signed)
This is okay to send #12/4 refills

## 2016-07-23 NOTE — Telephone Encounter (Signed)
Rx sent to [facility]

## 2016-07-23 NOTE — Telephone Encounter (Signed)
The dosage is Vitamin D 50,000 once weekly. Would like the Rx sent to [facility]. Please Advise.

## 2016-07-24 ENCOUNTER — Other Ambulatory Visit: Payer: Self-pay | Admitting: *Deleted

## 2016-07-24 MED ORDER — LEVOTHYROXINE SODIUM 100 MCG PO TABS: 100.0000 ug | ORAL_TABLET | Freq: Every day | ORAL | 1 refills | Status: DC

## 2016-07-24 NOTE — Telephone Encounter (Signed)
[facility]

## 2016-08-08 ENCOUNTER — Other Ambulatory Visit: Payer: Medicare HMO

## 2016-08-08 ENCOUNTER — Other Ambulatory Visit: Payer: Self-pay | Admitting: Nurse Practitioner

## 2016-08-08 DIAGNOSIS — E039 Hypothyroidism, unspecified: Secondary | ICD-10-CM | POA: Diagnosis not present

## 2016-08-09 LAB — TSH: TSH: 1.74 m[IU]/L

## 2016-08-20 ENCOUNTER — Encounter: Payer: Self-pay | Admitting: Nurse Practitioner

## 2016-08-26 ENCOUNTER — Other Ambulatory Visit: Payer: Self-pay

## 2016-08-26 MED ORDER — LEVOTHYROXINE SODIUM 100 MCG PO TABS: ORAL_TABLET | ORAL | 1 refills | Status: DC

## 2016-08-26 NOTE — Telephone Encounter (Signed)
Received call on triage voice mail for Rx for thyroid Rx to [facility]. Called Lauren [name] to let her know.

## 2016-10-15 ENCOUNTER — Ambulatory Visit: Payer: Medicare HMO

## 2016-10-21 ENCOUNTER — Ambulatory Visit (INDEPENDENT_AMBULATORY_CARE_PROVIDER_SITE_OTHER): Payer: Medicare HMO | Admitting: Nurse Practitioner

## 2016-10-21 ENCOUNTER — Ambulatory Visit: Payer: Medicare HMO

## 2016-10-21 ENCOUNTER — Encounter: Payer: Self-pay | Admitting: Nurse Practitioner

## 2016-10-21 VITALS — BP 138/86 | HR 71 | Temp 98.2°F | Resp 17 | Ht 66.0 in | Wt 137.4 lb

## 2016-10-21 DIAGNOSIS — I10 Essential (primary) hypertension: Secondary | ICD-10-CM | POA: Diagnosis not present

## 2016-10-21 DIAGNOSIS — F0150 Vascular dementia without behavioral disturbance: Secondary | ICD-10-CM | POA: Diagnosis not present

## 2016-10-21 DIAGNOSIS — R69 Illness, unspecified: Secondary | ICD-10-CM | POA: Diagnosis not present

## 2016-10-21 DIAGNOSIS — Z0000 Encounter for general adult medical examination without abnormal findings: Secondary | ICD-10-CM | POA: Diagnosis not present

## 2016-10-21 DIAGNOSIS — M810 Age-related osteoporosis without current pathological fracture: Secondary | ICD-10-CM

## 2016-10-21 DIAGNOSIS — E559 Vitamin D deficiency, unspecified: Secondary | ICD-10-CM

## 2016-10-21 LAB — CBC WITH DIFFERENTIAL/PLATELET
BASOS PCT: 0 %
Basophils Absolute: 0 cells/uL (ref 0–200)
EOS PCT: 4 %
Eosinophils Absolute: 272 cells/uL (ref 15–500)
HCT: 40.3 % (ref 35.0–45.0)
Hemoglobin: 13.5 g/dL (ref 11.7–15.5)
LYMPHS PCT: 17 %
Lymphs Abs: 1156 cells/uL (ref 850–3900)
MCH: 31.7 pg (ref 27.0–33.0)
MCHC: 33.5 g/dL (ref 32.0–36.0)
MCV: 94.6 fL (ref 80.0–100.0)
MONOS PCT: 9 %
MPV: 11.8 fL (ref 7.5–12.5)
Monocytes Absolute: 612 cells/uL (ref 200–950)
NEUTROS ABS: 4760 {cells}/uL (ref 1500–7800)
Neutrophils Relative %: 70 %
Platelets: 174 10*3/uL (ref 140–400)
RBC: 4.26 MIL/uL (ref 3.80–5.10)
RDW: 14.6 % (ref 11.0–15.0)
WBC: 6.8 10*3/uL (ref 3.8–10.8)

## 2016-10-21 NOTE — Patient Instructions (Signed)

## 2016-10-21 NOTE — Progress Notes (Signed)
Provider: [name], NP  Patient Care Team: [name], NP as PCP - General (Nurse Practitioner)  Extended Emergency Contact Information Primary Emergency Contact: Plair,Cathy Address: [address]          [place] [address] [place] of [place] Phone: [phone] Relation: Daughter Allergies  Allergen Reactions  . Aricept [Donepezil Hcl]   . Aspirin     Upset stomach    Code Status: FULL Goals of Care: Advanced Directive information Advanced Directives 10/21/2016  Does Patient Have a Medical Advance Directive? Yes  Type of Advance Directive [place]  Does patient want to make changes to medical advance directive? -  Copy of [place] in Chart? -  Would patient like information on creating a medical advance directive? -     Chief Complaint  Patient presents with  . Medical Management of Chronic Issues    Pt is being seen for a complete physical and follow up on chronic conditions. Failed clock drawing.   . Other    Daughter, [name], in room    HPI: Patient is a 83 y.o. female seen in today for an annual wellness exam.   Major illnesses or hospitalization in the last year: none   Depression screen [facility] 2/9 10/21/2016 09/21/2015 10/13/2014 07/11/2014 05/23/2014  Decreased Interest 0 0 0 0 0  Down, Depressed, Hopeless 0 0 0 0 0  PHQ - 2 Score 0 0 0 0 0    Fall Risk  10/21/2016 01/11/2016 11/28/2015 10/31/2015 09/21/2015  Falls in the past year? No No No Yes Yes  Number falls in past yr: - - - 1 1  Comment - - - Fell on 10-11-15 and went to ED -  Injury with Fall? - - - No Yes  Comment - - - - Fracture of pelvis   MMSE - Mini Mental State Exam 10/21/2016 09/21/2015 03/24/2014  Orientation to time '2 3 5  ' Orientation to Place '1 4 3  ' Registration '3 3 3  ' Attention/ Calculation 0 5 5  Recall 0 0 2  Language- name 2 objects '2 2 2  ' Language- repeat '1 1 1  ' Language- follow 3 step command '2 2 3  ' Language- read &  follow direction '1 1 1  ' Write a sentence '1 1 1  ' Copy design '1 1 1  ' Total score '14 23 27   ' Lives at [facility], daughter helps her with finances and pills.  Getting out more, more social at her facility.  Daughter not concerned over memory loss, feels like she is still able to take care of herself.    Health Maintenance  Topic Date Due  . INFLUENZA VACCINE  10/16/2016  . TETANUS/TDAP  02/01/2025  . DEXA SCAN  Completed  . PNA vac Low Risk Adult  Completed     Functional Status Survey: Is the patient deaf or have difficulty hearing?: No Does the patient have difficulty seeing, even when wearing glasses/contacts?: No (eye MD routinely) Does the patient have difficulty concentrating, remembering, or making decisions?: Yes (lives in independent facility ) Does the patient have difficulty walking or climbing stairs?: No Does the patient have difficulty dressing or bathing?: No Does the patient have difficulty doing errands alone such as visiting a doctor's office or shopping?: Yes (daughter drives her. ) Current Exercise Habits: Home exercise routine, Time (Minutes): 10, Frequency (Times/Week): 7, Weekly Exercise (Minutes/Week): 70   Diet?food provided at facility, eats a lot of sweets Ophthalmology:  yearly  Dentition: twice yearly Pain: no pain at all.   Past Medical History:  Diagnosis Date  . Anxiety   . High cholesterol   . Hypertension   . Osteoporosis   . Rosacea   . Thyroid disease   . Weight loss     Past Surgical History:  Procedure Laterality Date  . CHOLECYSTECTOMY  1959   [place]  . THYRO[place]ECTOMY  2003   [place]  . TONSILLECTOMY AND ADENOIDECTOMY      Social History   Social History  . Marital status: Widowed    Spouse name: N/A  . Number of children: N/A  . Years of education: N/A   Social History Main Topics  . Smoking status: Never Smoker  . Smokeless tobacco: Never Used  . Alcohol use No  . Drug use: No  . Sexual activity: Not Currently    Other Topics Concern  . None   Social History Narrative   As of 03/24/2014:   Diet-good   Widow   Lives in [place], 1 stories, 1 person, no pets   Past/current profession- receptionist   No exercise     Family History  Problem Relation Age of Onset  . Hypertension Mother   . Stroke Mother   . Stroke Father   . Lung cancer Daughter     Review of Systems:  Review of Systems  Constitutional: Negative for activity change, appetite change, chills, fatigue and unexpected weight change.  HENT: Negative for postnasal drip and rhinorrhea.   Eyes: Negative for discharge, redness and itching.  Respiratory: Negative for cough, chest tightness and shortness of breath.   Cardiovascular: Positive for leg swelling. Negative for chest pain and palpitations.       Mild right foot swelling, 1+  Gastrointestinal: Negative for abdominal pain, constipation, diarrhea, nausea and vomiting.  Genitourinary: Negative for difficulty urinating, frequency, hematuria, pelvic pain and urgency.  Musculoskeletal: Negative.   Neurological: Negative.   Psychiatric/Behavioral: Negative.        Memory loss     Allergies as of 10/21/2016      Reactions   Aricept [donepezil Hcl]    Aspirin    Upset stomach       Medication List       Accurate as of 10/21/16  2:02 PM. Always use your most recent med list.          atorvastatin 10 MG tablet Commonly known as:  LIPITOR Take 0.5 tablets (5 mg total) by mouth daily.   buPROPion 150 MG 24 hr tablet Commonly known as:  WELLBUTRIN XL TAKE 1 TABLET DAILY IN THE MORNING FOR ANXIETY   Calcium Carbonate-Vitamin D 600-400 MG-UNIT tablet Commonly known as:  CALTRATE 600+D Take 1 tablet by mouth 2 (two) times daily.   levothyroxine 100 MCG tablet Commonly known as:  SYNTHROID, LEVOTHROID TAKE 1 TABLET BY MOUTH DAILY BEFORE BREAKFAST   losartan 100 MG tablet Commonly known as:  COZAAR TAKE 1 TABLET DAILY   memantine 14 MG Cp24 24 hr capsule Commonly  known as:  NAMENDA XR Take 1 capsule (14 mg total) by mouth daily. For memory   metoprolol tartrate 25 MG tablet Commonly known as:  LOPRESSOR TAKE 1 TABLET TWICE A DAY FOR BLOOD PRESSURE   mirtazapine 15 MG tablet Commonly known as:  REMERON TAKE 1 TABLET ONCE DAILY DUE TO WEIGHT LOSS TO INCREASE APPETITE AND ANXIETY, DEPRESSION   multivitamin with minerals Tabs tablet Take 2 tablets by mouth daily.   Vitamin D (  Ergocalciferol) 50000 units Caps capsule Commonly known as:  DRISDOL Take 1 capsule (50,000 Units total) by mouth every 7 (seven) days.         Physical Exam: Vitals:   10/21/16 1330  BP: 138/86  Pulse: 71  Resp: 17  Temp: 98.2 F (36.8 C)  TempSrc: Oral  SpO2: 96%  Weight: 137 lb 6.4 oz (62.3 kg)  Height: '5\' 6"'  (1.676 m)   Body mass index is 22.18 kg/m. Physical Exam  Constitutional: She appears well-developed and well-nourished. No distress.  HENT:  Head: Normocephalic and atraumatic.  Right Ear: External ear normal.  Left Ear: External ear normal.  Nose: Nose normal.  Mouth/Throat: Oropharynx is clear and moist. No oropharyngeal exudate.  Eyes: Pupils are equal, round, and reactive to light. Conjunctivae and EOM are normal.  Neck: Normal range of motion. Neck supple.  Cardiovascular: Normal rate, regular rhythm, normal heart sounds and intact distal pulses.   No murmur heard. Pulmonary/Chest: Effort normal and breath sounds normal. No respiratory distress. She has no wheezes.  Declines breast exam   Abdominal: Soft. Bowel sounds are normal. She exhibits no distension. There is no tenderness.  Musculoskeletal: Normal range of motion. She exhibits no edema or tenderness.  Neurological: She is alert. She has normal reflexes. No cranial nerve deficit. Coordination normal.  Skin: Skin is warm and dry.  Psychiatric: She has a normal mood and affect.    Labs reviewed: Basic Metabolic Panel:  Recent Labs  11/24/15 0947 04/12/16 0907 04/16/16 1355  04/16/16 1400 06/13/16 1038 08/08/16 0203  NA 144 146  --   --   --   --   K 3.9 3.8  --   --   --   --   CL 106 109  --   --   --   --   CO2 27 22  --   --   --   --   GLUCOSE 83 80  --   --   --   --   BUN 19 21  --   --   --   --   CREATININE 1.41* 1.26*  --   --   --   --   CALCIUM 8.5* 7.5*  --  7.7*  --   --   TSH  --   --  9.19*  --  8.69* 1.74   Liver Function Tests:  Recent Labs  11/24/15 0947  AST 19  ALT 11  ALKPHOS 82  BILITOT 0.9  PROT 6.9  ALBUMIN 4.2   No results for input(s): LIPASE, AMYLASE in the last 8760 hours. No results for input(s): AMMONIA in the last 8760 hours. CBC: No results for input(s): WBC, NEUTROABS, HGB, HCT, MCV, PLT in the last 8760 hours. Lipid Panel:  Recent Labs  11/24/15 0947  CHOL 193  HDL 51  LDLCALC 117  TRIG 126  CHOLHDL 3.8   Lab Results  Component Value Date   HGBA1C (H) 11/23/2009    5.7 (NOTE)                                                                       According to the ADA Clinical Practice Recommendations for 2011, when HbA1c is used as  a screening test:   >=6.5%   Diagnostic of Diabetes Mellitus           (if abnormal result  is confirmed)  5.7-6.4%   Increased risk of developing Diabetes Mellitus  References:Diagnosis and Classification of Diabetes Mellitus,Diabetes EXBM,8413,24(MWNUU 1):S62-S69 and Standards of Medical Care in         Diabetes - 2011,Diabetes VOZD,6644,03  (Suppl 1):S11-S61.    Procedures: No results found.  Assessment/Plan 1. Wellness examination -pt living at retirement community and doing well in current setting. Discussed regular self-examination of the breasts on a monthly basis however she declines mammogram, prevention of dental and periodontal disease, diet, regular sustained exercise for at least 30 minutes 5 times per week, cholesterol, thyroid and diabetes screening. -recommended dexa scan and pt agreeable to this.  Recommended to take caltrate with D 600/400 twice a day.    -weight has been stable.   2. Hypocalcemia -conts on calcium and vit d, will follow up lab - CMP with eGFR  3. Vascular dementia without behavioral disturbance MMSE is significantly worse than last year. conts on namenda XR, cid not tolerate aricept when tried in past. Pt at retirement community in independent living and does well on a day to day basis. Daughter visits frequently and helps her.   4. Osteoporosis without current pathological fracture, unspecified osteoporosis type -to cont calcium and vit D, not currently on Rx for OP, will follow up bone density.  - DG Bone Density; Future  5. Vitamin D deficiency -conts on vit d 50,000 units weekly  6. Essential hypertension -stable, cont current regimen, dash diet recommended.  - CBC with Differential/Platelets  Next appt: 3 months for routine follow up, and weight loss.  Carlos American. Harle Battiest  [facility] [phone] 8 am - 5 pm) [phone] (after hours)

## 2016-10-22 LAB — COMPLETE METABOLIC PANEL WITH GFR
ALT: 16 U/L (ref 6–29)
AST: 21 U/L (ref 10–35)
Albumin: 3.8 g/dL (ref 3.6–5.1)
Alkaline Phosphatase: 86 U/L (ref 33–130)
BILIRUBIN TOTAL: 0.5 mg/dL (ref 0.2–1.2)
BUN: 20 mg/dL (ref 7–25)
CHLORIDE: 109 mmol/L (ref 98–110)
CO2: 24 mmol/L (ref 20–32)
Calcium: 7.7 mg/dL — ABNORMAL LOW (ref 8.6–10.4)
Creat: 1.18 mg/dL — ABNORMAL HIGH (ref 0.60–0.88)
GFR, EST AFRICAN AMERICAN: 47 mL/min — AB (ref >=60)
GFR, EST NON AFRICAN AMERICAN: 41 mL/min — AB (ref >=60)
GLUCOSE: 81 mg/dL (ref 65–99)
Potassium: 3.7 mmol/L (ref 3.5–5.3)
SODIUM: 144 mmol/L (ref 135–146)
TOTAL PROTEIN: 6.4 g/dL (ref 6.1–8.1)

## 2016-10-30 ENCOUNTER — Other Ambulatory Visit: Payer: Self-pay | Admitting: Nurse Practitioner

## 2016-10-30 DIAGNOSIS — E7800 Pure hypercholesterolemia, unspecified: Secondary | ICD-10-CM

## 2016-12-15 DIAGNOSIS — R69 Illness, unspecified: Secondary | ICD-10-CM | POA: Diagnosis not present

## 2016-12-20 ENCOUNTER — Other Ambulatory Visit: Payer: Self-pay | Admitting: Nurse Practitioner

## 2016-12-20 DIAGNOSIS — F32A Depression, unspecified: Secondary | ICD-10-CM

## 2016-12-20 DIAGNOSIS — F419 Anxiety disorder, unspecified: Principal | ICD-10-CM

## 2016-12-20 DIAGNOSIS — F329 Major depressive disorder, single episode, unspecified: Secondary | ICD-10-CM

## 2016-12-25 ENCOUNTER — Ambulatory Visit (INDEPENDENT_AMBULATORY_CARE_PROVIDER_SITE_OTHER): Payer: Medicare HMO | Admitting: Nurse Practitioner

## 2016-12-25 ENCOUNTER — Encounter: Payer: Self-pay | Admitting: Nurse Practitioner

## 2016-12-25 VITALS — BP 128/84 | HR 77 | Temp 98.1°F | Resp 18 | Ht 66.0 in | Wt 134.6 lb

## 2016-12-25 DIAGNOSIS — R21 Rash and other nonspecific skin eruption: Secondary | ICD-10-CM | POA: Diagnosis not present

## 2016-12-25 MED ORDER — ZOSTER VAC RECOMB ADJUVANTED 50 MCG/0.5ML IM SUSR: 0.5000 mL | Freq: Once | INTRAMUSCULAR | 1 refills | Status: AC

## 2016-12-25 NOTE — Patient Instructions (Addendum)
To use moisturizing lotion with no fragrance twice daily  May use OTC hydrocortisone cream for itch

## 2016-12-25 NOTE — Progress Notes (Signed)
Careteam: Patient Care Team: [name], NP as PCP - General (Nurse Practitioner)  Advanced Directive information Does Patient Have a Medical Advance Directive?: Yes, Type of Advance Directive: Living will;Healthcare Power of Attorney  Allergies  Allergen Reactions  . Aricept [Donepezil Hcl]   . Aspirin     Upset stomach     Chief Complaint  Patient presents with  . Acute Visit    Pt is being seen for a rash on both ankles x 10 days. Rash did not hurt or itch and has began to clear.   . Other    Daughter, [name], in room.     HPI: Patient is a 83 y.o. female seen in the office today due to rash on bilateral ankles/legs. Has been there for several weeks. Does not itch or hurt. Unaware of it except she saw it and wanted to have it looked at. Daughter was worried about infection and put antibiotic ointment on it once but she would not continued it.   Review of Systems:  Review of Systems  Constitutional: Negative for chills and fever.  Skin: Positive for rash.  Neurological: Negative for weakness.    Past Medical History:  Diagnosis Date  . Anxiety   . High cholesterol   . Hypertension   . Osteoporosis   . Rosacea   . Thyroid disease   . Weight loss    Past Surgical History:  Procedure Laterality Date  . CHOLECYSTECTOMY  1959   [place]  . THYROIDECTOMY  2003   [place]  . TONSILLECTOMY AND ADENOIDECTOMY     Social History:   reports that she has never smoked. She has never used smokeless tobacco. She reports that she does not drink alcohol or use drugs.  Family History  Problem Relation Age of Onset  . Hypertension Mother   . Stroke Mother   . Stroke Father   . Lung cancer Daughter     Medications: Patient's Medications  New Prescriptions   No medications on file  Previous Medications   ATORVASTATIN (LIPITOR) 10 MG TABLET    TAKE ONE-HALF (1/2) TABLET DAILY   BUPROPION (WELLBUTRIN XL) 150 MG 24 HR TABLET    TAKE 1 TABLET DAILY IN THE MORNING  FOR ANXIETY   CALCIUM CARBONATE-VITAMIN D (CALTRATE 600+D) 600-400 MG-UNIT TABLET    Take 1 tablet by mouth 2 (two) times daily.   LEVOTHYROXINE (SYNTHROID, LEVOTHROID) 100 MCG TABLET    TAKE 1 TABLET BY MOUTH DAILY BEFORE BREAKFAST   LOSARTAN (COZAAR) 100 MG TABLET    TAKE 1 TABLET DAILY   MEMANTINE (NAMENDA XR) 14 MG CP24 24 HR CAPSULE    Take 1 capsule (14 mg total) by mouth daily. For memory   METOPROLOL TARTRATE (LOPRESSOR) 25 MG TABLET    TAKE 1 TABLET TWICE A DAY FOR BLOOD PRESSURE   MIRTAZAPINE (REMERON) 15 MG TABLET    TAKE 1 TABLET ONCE DAILY DUE TO WEIGHT LOSS TO INCREASE APPETITE AND ANXIETY, DEPRESSION   MULTIPLE VITAMIN (MULTIVITAMIN WITH MINERALS) TABS TABLET    Take 2 tablets by mouth daily.    VITAMIN D, ERGOCALCIFEROL, (DRISDOL) 50000 UNITS CAPS CAPSULE    Take 1 capsule (50,000 Units total) by mouth every 7 (seven) days.  Modified Medications   No medications on file  Discontinued Medications   No medications on file     Physical Exam:  Vitals:   12/25/16 1449  BP: 128/84  Pulse: 77  Resp: 18  Temp: 98.1 F (36.7  C)  TempSrc: Oral  SpO2: 98%  Weight: 134 lb 9.6 oz (61.1 kg)  Height: 5\' 6"  (1.676 m)   Body mass index is 21.73 kg/m.  Physical Exam  Constitutional: She is oriented to person, place, and time. She appears well-developed and well-nourished. No distress.  Cardiovascular: Normal rate, regular rhythm and normal heart sounds.   No murmur heard. Pulmonary/Chest: Effort normal and breath sounds normal. No respiratory distress. She has no wheezes.  Abdominal: Soft. Bowel sounds are normal. She exhibits no distension. There is no tenderness.  Neurological: She is alert and oriented to person, place, and time.  Skin: Skin is warm and dry. Rash noted.  Rough light pink patch noted to right medial aspect of lower leg and round  pink area anterior lower leg. No erythema or drainage.    Psychiatric: She has a normal mood and affect.    Labs  reviewed: Basic Metabolic Panel:  Recent Labs  04/12/16 0907 04/16/16 1355 04/16/16 1400 06/13/16 1038 08/08/16 0203 10/21/16 1438  NA 146  --   --   --   --  144  K 3.8  --   --   --   --  3.7  CL 109  --   --   --   --  109  CO2 22  --   --   --   --  24  GLUCOSE 80  --   --   --   --  81  BUN 21  --   --   --   --  20  CREATININE 1.26*  --   --   --   --  1.18*  CALCIUM 7.5*  --  7.7*  --   --  7.7*  TSH  --  9.19*  --  8.69* 1.74  --    Liver Function Tests:  Recent Labs  10/21/16 1438  AST 21  ALT 16  ALKPHOS 86  BILITOT 0.5  PROT 6.4  ALBUMIN 3.8   No results for input(s): LIPASE, AMYLASE in the last 8760 hours. No results for input(s): AMMONIA in the last 8760 hours. CBC:  Recent Labs  10/21/16 1438  WBC 6.8  NEUTROABS 4,760  HGB 13.5  HCT 40.3  MCV 94.6  PLT 174   Lipid Panel: No results for input(s): CHOL, HDL, LDLCALC, TRIG, CHOLHDL, LDLDIRECT in the last 8760 hours. TSH:  Recent Labs  04/16/16 1355 06/13/16 1038 08/08/16 0203  TSH 9.19* 8.69* 1.74   A1C: Lab Results  Component Value Date   HGBA1C (H) 11/23/2009    5.7 (NOTE)                                                                       According to the ADA Clinical Practice Recommendations for 2011, when HbA1c is used as a screening test:   >=6.5%   Diagnostic of Diabetes Mellitus           (if abnormal result  is confirmed)  5.7-6.4%   Increased risk of developing Diabetes Mellitus  References:Diagnosis and Classification of Diabetes Mellitus,Diabetes ZOXW,9604,54(UJWJX 1):S62-S69 and Standards of Medical Care in         Diabetes - 2011,Diabetes BJYN,8295,62  (Suppl 1):S11-S61.  Assessment/Plan 1. Rash Seems to be improving, appears to be patches of dry skin/excema. Not itching.  Encouraged to use moisturizing lotion with no fragrance twice daily  May use OTC hydrocortisone cream daily if needed for itch  Next appt: next month as scheduled, sooner if needed  Lakindra Wible K.  Harle Battiest  [facility] [phone] 8 am - 5 pm) [phone] (after hours)

## 2017-01-23 ENCOUNTER — Ambulatory Visit (INDEPENDENT_AMBULATORY_CARE_PROVIDER_SITE_OTHER): Payer: Medicare HMO | Admitting: Nurse Practitioner

## 2017-01-23 ENCOUNTER — Encounter: Payer: Self-pay | Admitting: Nurse Practitioner

## 2017-01-23 VITALS — BP 122/78 | HR 66 | Temp 98.6°F | Resp 17 | Ht 66.0 in | Wt 135.6 lb

## 2017-01-23 DIAGNOSIS — F419 Anxiety disorder, unspecified: Secondary | ICD-10-CM | POA: Diagnosis not present

## 2017-01-23 DIAGNOSIS — R69 Illness, unspecified: Secondary | ICD-10-CM | POA: Diagnosis not present

## 2017-01-23 DIAGNOSIS — E559 Vitamin D deficiency, unspecified: Secondary | ICD-10-CM | POA: Diagnosis not present

## 2017-01-23 DIAGNOSIS — I10 Essential (primary) hypertension: Secondary | ICD-10-CM

## 2017-01-23 DIAGNOSIS — E782 Mixed hyperlipidemia: Secondary | ICD-10-CM

## 2017-01-23 DIAGNOSIS — F0150 Vascular dementia without behavioral disturbance: Secondary | ICD-10-CM | POA: Diagnosis not present

## 2017-01-23 DIAGNOSIS — R634 Abnormal weight loss: Secondary | ICD-10-CM | POA: Diagnosis not present

## 2017-01-23 DIAGNOSIS — M810 Age-related osteoporosis without current pathological fracture: Secondary | ICD-10-CM

## 2017-01-23 LAB — LIPID PANEL
CHOLESTEROL: 173 mg/dL (ref <200)
HDL: 46 mg/dL — ABNORMAL LOW (ref >50)
LDL Cholesterol (Calc): 104 mg/dL (calc) — ABNORMAL HIGH
Non-HDL Cholesterol (Calc): 127 mg/dL (calc) (ref <130)
Total CHOL/HDL Ratio: 3.8 (calc) (ref <5.0)
Triglycerides: 132 mg/dL (ref <150)

## 2017-01-23 LAB — BASIC METABOLIC PANEL WITHOUT GFR
BUN/Creatinine Ratio: 15 (calc) (ref 6–22)
BUN: 18 mg/dL (ref 7–25)
CO2: 29 mmol/L (ref 20–32)
Calcium: 8.1 mg/dL — ABNORMAL LOW (ref 8.6–10.4)
Chloride: 107 mmol/L (ref 98–110)
Creat: 1.22 mg/dL — ABNORMAL HIGH (ref 0.60–0.88)
GFR, Est African American: 45 mL/min/1.73m2 — ABNORMAL LOW
GFR, Est Non African American: 39 mL/min/1.73m2 — ABNORMAL LOW
Glucose, Bld: 92 mg/dL (ref 65–99)
Potassium: 3.8 mmol/L (ref 3.5–5.3)
Sodium: 144 mmol/L (ref 135–146)

## 2017-01-23 NOTE — Progress Notes (Signed)
Careteam: Patient Care Team: [name], NP as PCP - General (Nurse Practitioner)  Advanced Directive information Does Patient Have a Medical Advance Directive?: Yes, Type of Advance Directive: [place];Living will, Does patient want to make changes to medical advance directive?: No - Patient declined  Allergies  Allergen Reactions  . Aricept [Donepezil Hcl]   . Aspirin     Upset stomach     Chief Complaint  Patient presents with  . Medical Management of Chronic Issues    Pt is being seen for a 3 month routine visit.   . Other    Daughter in room     HPI: Patient is a 83 y.o. female seen in the office today for routine follow up.   Hyperlipidemia- taking Lipitor no recent lipids, fasting today  Dementia- unable to tolerate Aricept on namenda attitude about life is much betteer  Vit D def- conts on 50000 units weekly   Hypothyroid- postoperative- conts on synthroid  TSH 1.74 in May  Osteoporosis- dexa has been ordered, currently on calcium and vit d   Review of Systems:  Review of Systems  Constitutional: Negative for chills, fever and weight loss.  HENT: Negative for hearing loss.   Respiratory: Negative for cough, sputum production and shortness of breath.   Cardiovascular: Negative for chest pain, palpitations and leg swelling.  Gastrointestinal: Negative for abdominal pain, constipation, diarrhea and heartburn.  Genitourinary: Negative for dysuria, frequency and urgency.  Musculoskeletal: Negative for back pain, falls, joint pain and myalgias.  Skin: Negative.   Neurological: Negative for dizziness and headaches.  Psychiatric/Behavioral: Positive for depression (controlled ) and memory loss. The patient does not have insomnia.     Past Medical History:  Diagnosis Date  . Anxiety   . High cholesterol   . Hypertension   . Osteoporosis   . Rosacea   . Thyroid disease   . Weight loss    Past Surgical History:  Procedure  Laterality Date  . CHOLECYSTECTOMY  1959   [place]  . THYROIDECTOMY  2003   [place]  . TONSILLECTOMY AND ADENOIDECTOMY     Social History:   reports that  has never smoked. she has never used smokeless tobacco. She reports that she does not drink alcohol or use drugs.  Family History  Problem Relation Age of Onset  . Hypertension Mother   . Stroke Mother   . Stroke Father   . Lung cancer Daughter     Medications:   Medication List        Accurate as of 01/23/17 10:08 AM. Always use your most recent med list.          atorvastatin 10 MG tablet Commonly known as:  LIPITOR TAKE ONE-HALF (1/2) TABLET DAILY   buPROPion 150 MG 24 hr tablet Commonly known as:  WELLBUTRIN XL TAKE 1 TABLET DAILY IN THE MORNING FOR ANXIETY   Calcium Carbonate-Vitamin D 600-400 MG-UNIT tablet Commonly known as:  CALTRATE 600+D Take 1 tablet by mouth 2 (two) times daily.   levothyroxine 100 MCG tablet Commonly known as:  SYNTHROID, LEVOTHROID TAKE 1 TABLET BY MOUTH DAILY BEFORE BREAKFAST   losartan 100 MG tablet Commonly known as:  COZAAR TAKE 1 TABLET DAILY   memantine 14 MG Cp24 24 hr capsule Commonly known as:  NAMENDA XR Take 1 capsule (14 mg total) by mouth daily. For memory   metoprolol tartrate 25 MG tablet Commonly known as:  LOPRESSOR TAKE 1 TABLET TWICE A DAY  FOR BLOOD PRESSURE   mirtazapine 15 MG tablet Commonly known as:  REMERON TAKE 1 TABLET ONCE DAILY DUE TO WEIGHT LOSS TO INCREASE APPETITE AND ANXIETY, DEPRESSION   multivitamin with minerals Tabs tablet   Vitamin D (Ergocalciferol) 50000 units Caps capsule Commonly known as:  DRISDOL Take 1 capsule (50,000 Units total) by mouth every 7 (seven) days.        Physical Exam:  Vitals:   01/23/17 1001  BP: 122/78  Pulse: 66  Resp: 17  Temp: 98.6 F (37 C)  TempSrc: Oral  SpO2: 95%  Weight: 135 lb 9.6 oz (61.5 kg)  Height: _0  (1.676 m)   Body mass index is 21.89 kg/m.  Physical Exam    Constitutional: She is oriented to person, place, and time. She appears well-developed and well-nourished. No distress.  Cardiovascular: Normal rate, regular rhythm, normal heart sounds and intact distal pulses.  No murmur heard. Pulmonary/Chest: Effort normal and breath sounds normal. No respiratory distress. She has no wheezes.  Abdominal: Soft. Bowel sounds are normal. She exhibits no distension. There is no tenderness.  Neurological: She is alert and oriented to person, place, and time.  Skin: Skin is warm and dry.  Psychiatric: She has a normal mood and affect.    Labs reviewed: Basic Metabolic Panel: Recent Labs    04/12/16 0907 04/16/16 1355 04/16/16 1400 06/13/16 1038 08/08/16 0203 10/21/16 1438  NA 146  --   --   --   --  144  K 3.8  --   --   --   --  3.7  CL 109  --   --   --   --  109  CO2 22  --   --   --   --  24  GLUCOSE 80  --   --   --   --  81  BUN 21  --   --   --   --  20  CREATININE 1.26*  --   --   --   --  1.18*  CALCIUM 7.5*  --  7.7*  --   --  7.7*  TSH  --  9.19*  --  8.69* 1.74  --    Liver Function Tests: Recent Labs    10/21/16 1438  AST 21  ALT 16  ALKPHOS 86  BILITOT 0.5  PROT 6.4  ALBUMIN 3.8   No results for input(s): LIPASE, AMYLASE in the last 8760 hours. No results for input(s): AMMONIA in the last 8760 hours. CBC: Recent Labs    10/21/16 1438  WBC 6.8  NEUTROABS 4,760  HGB 13.5  HCT 40.3  MCV 94.6  PLT 174   Lipid Panel: No results for input(s): CHOL, HDL, LDLCALC, TRIG, CHOLHDL, LDLDIRECT in the last 8760 hours. TSH: Recent Labs    04/16/16 1355 06/13/16 1038 08/08/16 0203  TSH 9.19* 8.69* 1.74   A1C: Lab Results  Component Value Date   HGBA1C (H) 11/23/2009    5.7 (NOTE)                                                                       According to the ADA Clinical Practice Recommendations for 2011, when HbA1c is used as a  screening test:   >=6.5%   Diagnostic of Diabetes Mellitus           (if abnormal  result  is confirmed)  5.7-6.4%   Increased risk of developing Diabetes Mellitus  References:Diagnosis and Classification of Diabetes Mellitus,Diabetes BJYN,8295,62(ZHYQM 1):S62-S69 and Standards of Medical Care in         Diabetes - 2011,Diabetes Care,2011,34  (Suppl 1):S11-S61.     Assessment/Plan 1. Osteoporosis without current pathological fracture, unspecified osteoporosis type -dexa scan ordered, number provided to daughter to call and schedule.  To cont calcium and vit D  2. Vascular dementia without behavioral disturbance Stable, tolerating namenda. No acute changes to cognitive or functional status.  3. Vitamin D deficiency Maintained on vit D 50,000 units weekly   4. Essential hypertension Blood pressure stable, well controlled on lopressor and cozaar.  - BMP with eGFR  5. Mixed hyperlipidemia conts on lipitor, will follow up labs today.  - Lipid Panel  6. Loss of weight Weight remains stable with good appetite.   7. Anxiety Mood has been stable. Pt doing well on Wellbutrin and remeron.    Next appt: 4 months Jessica K. Harle Battiest  [facility] [phone] 8 am - 5 pm) [phone] (after hours)

## 2017-02-04 ENCOUNTER — Other Ambulatory Visit: Payer: Self-pay | Admitting: Nurse Practitioner

## 2017-02-04 DIAGNOSIS — R413 Other amnesia: Secondary | ICD-10-CM

## 2017-03-17 ENCOUNTER — Other Ambulatory Visit: Payer: Self-pay | Admitting: Internal Medicine

## 2017-03-17 ENCOUNTER — Other Ambulatory Visit: Payer: Self-pay | Admitting: Nurse Practitioner

## 2017-03-17 DIAGNOSIS — F419 Anxiety disorder, unspecified: Secondary | ICD-10-CM

## 2017-03-17 DIAGNOSIS — F329 Major depressive disorder, single episode, unspecified: Secondary | ICD-10-CM

## 2017-03-17 DIAGNOSIS — R634 Abnormal weight loss: Secondary | ICD-10-CM

## 2017-03-17 DIAGNOSIS — F32A Depression, unspecified: Secondary | ICD-10-CM

## 2017-03-19 ENCOUNTER — Ambulatory Visit
Admission: RE | Admit: 2017-03-19 | Discharge: 2017-03-19 | Disposition: A | Discharge status: Home / Self Care | Payer: Self-pay | Source: Ambulatory Visit | Attending: Nurse Practitioner | Admitting: Nurse Practitioner

## 2017-03-19 DIAGNOSIS — M810 Age-related osteoporosis without current pathological fracture: Secondary | ICD-10-CM

## 2017-03-19 DIAGNOSIS — Z780 Asymptomatic menopausal state: Secondary | ICD-10-CM | POA: Diagnosis not present

## 2017-03-20 ENCOUNTER — Telehealth: Payer: Self-pay

## 2017-03-20 ENCOUNTER — Other Ambulatory Visit: Payer: Self-pay

## 2017-03-20 MED ORDER — ALENDRONATE SODIUM 70 MG PO TABS: 70.0000 mg | ORAL_TABLET | ORAL | 1 refills | Status: DC

## 2017-03-20 NOTE — Telephone Encounter (Signed)
-----   Message from [name], NP sent at 03/20/2017  9:42 AM EST ----- bone density reveals she is OSTEOPOROTIC (thinning of the bone). Would recommend her starting treatment to avoid fracture. Just to clarify she has NOT been on medication for this in the past? Fosamax/alendronate for osteoporosis? If not would like to start her on fosamax 70 mg by mouth once weekly for osteoporosis, she should take this first thing in the morning 30 mins before any other food/drink/medication and avoid laying down for 30 mins after she takes it. Recommended to take caltrate with D 600/400 twice a day as well and weight bearing activity encouraged.

## 2017-03-20 NOTE — Telephone Encounter (Signed)
Medication list has been updated and Rx sent to pharmacy.

## 2017-05-06 ENCOUNTER — Telehealth: Payer: Self-pay | Admitting: Nurse Practitioner

## 2017-05-06 NOTE — Telephone Encounter (Signed)
Left msg w/ pt's daughter asking her to confirm this AWV-S. Last AWV 09/21/15. I asked if she can come at 2:30 instead of 2:15 so she can have AWV w/ nurse before seeing Janett Billow. VDM (DD)

## 2017-05-19 ENCOUNTER — Ambulatory Visit: Payer: Medicare HMO | Admitting: Nurse Practitioner

## 2017-05-19 ENCOUNTER — Ambulatory Visit (INDEPENDENT_AMBULATORY_CARE_PROVIDER_SITE_OTHER): Payer: Medicare HMO

## 2017-05-19 VITALS — BP 162/78 | HR 67 | Temp 97.8°F | Ht 66.0 in | Wt 137.0 lb

## 2017-05-19 VITALS — BP 170/84 | HR 67 | Temp 97.8°F | Ht 66.0 in | Wt 137.0 lb

## 2017-05-19 DIAGNOSIS — I10 Essential (primary) hypertension: Secondary | ICD-10-CM

## 2017-05-19 DIAGNOSIS — R634 Abnormal weight loss: Secondary | ICD-10-CM

## 2017-05-19 DIAGNOSIS — Z0000 Encounter for general adult medical examination without abnormal findings: Secondary | ICD-10-CM | POA: Diagnosis not present

## 2017-05-19 DIAGNOSIS — E7800 Pure hypercholesterolemia, unspecified: Secondary | ICD-10-CM | POA: Diagnosis not present

## 2017-05-19 DIAGNOSIS — E039 Hypothyroidism, unspecified: Secondary | ICD-10-CM

## 2017-05-19 DIAGNOSIS — F0150 Vascular dementia without behavioral disturbance: Secondary | ICD-10-CM | POA: Diagnosis not present

## 2017-05-19 DIAGNOSIS — R69 Illness, unspecified: Secondary | ICD-10-CM | POA: Diagnosis not present

## 2017-05-19 DIAGNOSIS — E559 Vitamin D deficiency, unspecified: Secondary | ICD-10-CM

## 2017-05-19 DIAGNOSIS — M810 Age-related osteoporosis without current pathological fracture: Secondary | ICD-10-CM

## 2017-05-19 MED ORDER — VITAMIN D (ERGOCALCIFEROL) 1.25 MG (50000 UNIT) PO CAPS: 50000.0000 [IU] | ORAL_CAPSULE | ORAL | 4 refills | Status: DC

## 2017-05-19 NOTE — Progress Notes (Addendum)
Subjective:   Lauren [name] is a 84 y.o. female who presents for Medicare Annual (Subsequent) preventive examination.  Last AWV-09/21/2015    Objective:     Vitals: BP (!) 162/78 (BP Location: Left Arm, Patient Position: Sitting)   Pulse 67   Temp 97.8 F (36.6 C) (Oral)   Ht 5\' 6"  (1.676 m)   Wt 137 lb (62.1 kg)   SpO2 95%   BMI 22.11 kg/m   Body mass index is 22.11 kg/m.  Provider notified of BP  Advanced Directives 05/19/2017 01/23/2017 12/25/2016 10/21/2016 11/28/2015 10/31/2015 10/11/2015  Does Patient Have a Medical Advance Directive? Yes Yes Yes Yes Yes Yes Yes  Type of Paramedic of [place];Living will Living will;Healthcare Power of [place] will [place];Living will Living will;Healthcare Power of Attorney  Does patient want to make changes to medical advance directive? No - Patient declined No - Patient declined - - - - -  Copy of [place] in Chart? Yes - Yes - Yes Yes No - copy requested  Would patient like information on creating a medical advance directive? - - - - - - -    Tobacco Social History   Tobacco Use  Smoking Status Never Smoker  Smokeless Tobacco Never Used     Counseling given: Not Answered   Clinical Intake:  Pre-visit preparation completed: No  Pain : No/denies pain     Diabetes: No  How often do you need to have someone help you when you read instructions, pamphlets, or other written materials from your doctor or pharmacy?: 1 - Never What is the last grade level you completed in school?: HS  Interpreter Needed?: No  Information entered by :: Lauren Dense, Rn  Past Medical History:  Diagnosis Date  . Anxiety   . High cholesterol   . Hypertension   . Osteoporosis   . Rosacea   . Thyroid disease   . Weight loss    Past Surgical History:  Procedure Laterality Date  . CHOLECYSTECTOMY  1959   [place]    . THYROIDECTOMY  2003   [place]  . TONSILLECTOMY AND ADENOIDECTOMY     Family History  Problem Relation Age of Onset  . Hypertension Mother   . Stroke Mother   . Stroke Father   . Lung cancer Daughter    Social History   Socioeconomic History  . Marital status: Widowed    Spouse name: None  . Number of children: None  . Years of education: None  . Highest education level: None  Social Needs  . Financial resource strain: Not hard at all  . Food insecurity - worry: Never true  . Food insecurity - inability: Never true  . Transportation needs - medical: No  . Transportation needs - non-medical: No  Occupational History  . None  Tobacco Use  . Smoking status: Never Smoker  . Smokeless tobacco: Never Used  Substance and Sexual Activity  . Alcohol use: No    Alcohol/week: 0.0 oz  . Drug use: No  . Sexual activity: Not Currently  Other Topics Concern  . None  Social History Narrative   As of 03/24/2014:   Diet-good   Widow   Lives in [place], 1 stories, 1 person, no pets   Past/current profession- receptionist   No exercise     Outpatient Encounter Medications as of 05/19/2017  Medication Sig  . atorvastatin (LIPITOR) 10  MG tablet TAKE ONE-HALF (1/2) TABLET DAILY  . buPROPion (WELLBUTRIN XL) 150 MG 24 hr tablet TAKE 1 TABLET DAILY IN THE MORNING FOR ANXIETY  . Calcium Carbonate-Vitamin D (CALTRATE 600+D) 600-400 MG-UNIT tablet Take 1 tablet by mouth 2 (two) times daily.  Marland Kitchen levothyroxine (SYNTHROID, LEVOTHROID) 100 MCG tablet TAKE 1 TABLET DAILY BEFORE BREAKFAST  . losartan (COZAAR) 100 MG tablet TAKE 1 TABLET DAILY  . memantine (NAMENDA XR) 14 MG CP24 24 hr capsule TAKE 1 CAPSULE DAILY FOR MEMORY  . metoprolol tartrate (LOPRESSOR) 25 MG tablet TAKE 1 TABLET TWICE A DAY FOR BLOOD PRESSURE  . mirtazapine (REMERON) 15 MG tablet TAKE 1 TABLET ONCE DAILY DUE TO WEIGHT LOSS TO INCREASE APPETITE AND ANXIETY, DEPRESSION  . Multiple Vitamin (MULTIVITAMIN WITH MINERALS) TABS tablet  Take 2 tablets by mouth daily.   . Vitamin D, Ergocalciferol, (DRISDOL) 50000 units CAPS capsule Take 1 capsule (50,000 Units total) by mouth every 7 (seven) days.  Marland Kitchen alendronate (FOSAMAX) 70 MG tablet Take 1 tablet (70 mg total) by mouth once a week. Take with a full glass of water on an empty stomach. Do not lay down for 30 minutes. (Patient not taking: Reported on 05/19/2017)   No facility-administered encounter medications on file as of 05/19/2017.     Activities of Daily Living In your present state of health, do you have any difficulty performing the following activities: 05/19/2017 10/21/2016  Hearing? Y N  Vision? N N  Comment - eye MD routinely  Difficulty concentrating or making decisions? Y Y  Comment - lives in independent facility   Walking or climbing stairs? N N  Dressing or bathing? N N  Doing errands, shopping? N Y  Comment - daughter drives her.   Preparing Food and eating ? N -  Using the Toilet? N -  In the past six months, have you accidently leaked urine? Y -  Comment wears pads -  Do you have problems with loss of bowel control? N -  Managing your Medications? N -  Managing your Finances? N -  Housekeeping or managing your Housekeeping? N -  Some recent data might be hidden    Patient Care Team: Lauren Chandler, NP as PCP - General (Nurse Practitioner)    Assessment:   This is a routine wellness examination for Lauren [name].  Exercise Activities and Dietary recommendations Current Exercise Habits: The patient does not participate in regular exercise at present, Exercise limited by: None identified  Goals    None      Fall Risk Fall Risk  05/19/2017 01/23/2017 12/25/2016 10/21/2016 01/11/2016  Falls in the past year? No No No No No  Number falls in past yr: - - - - -  Comment - - - - -  Injury with Fall? - - - - -  Comment - - - - -   Is the patient's home free of loose throw rugs in walkways, pet beds, electrical cords, etc?   yes      Grab bars in the  bathroom? yes      Handrails on the stairs?   yes      Adequate lighting?   yes  Depression Screen PHQ 2/9 Scores 05/19/2017 10/21/2016 09/21/2015 10/13/2014  PHQ - 2 Score 1 0 0 0     Cognitive Function MMSE - Mini Mental State Exam 10/21/2016 10/21/2016 09/21/2015 03/24/2014  Orientation to time 2 2 3 5   Orientation to Place 1 1 4 3   Registration 3 3  3 3  Attention/ Calculation 0 0 5 5  Recall 0 0 0 2  Language- name 2 objects 2 2 2 2   Language- repeat 1 1 1 1   Language- follow 3 step command 2 2 2 3   Language- read & follow direction 1 1 1 1   Write a sentence 1 1 1 1   Copy design 1 1 1 1   Total score 14 14 23 27         Immunization History  Administered Date(s) Administered  . DTaP 09/30/2012  . Influenza, High Dose Seasonal PF 12/15/2016  . Influenza,inj,Quad PF,6+ Mos 11/28/2015  . Influenza-Unspecified 01/28/2012, 12/16/2013, 12/14/2014  . Pneumococcal Conjugate-13 10/05/2013  . Pneumococcal Polysaccharide-23 02/26/2010  . Pneumococcal-Unspecified 03/18/2013  . Tdap 02/02/2015  . Zoster 09/30/2012  . Zoster Recombinat (Shingrix) 12/25/2016    Qualifies for Shingles Vaccine? Up to date  Screening Tests Health Maintenance  Topic Date Due  . TETANUS/TDAP  02/01/2025  . INFLUENZA VACCINE  Completed  . DEXA SCAN  Completed  . PNA vac Low Risk Adult  Completed    Cancer Screenings: Lung: Low Dose CT Chest recommended if Age 26-80 years, 30 pack-year currently smoking OR have quit w/in 15years. Patient does not qualify. Breast:  Up to date on Mammogram? Yes   Up to date of Bone Density/Dexa? Yes Colorectal: up to date  Additional Screenings: Hepatitis C Screening: declined     Plan:  I have personally reviewed and addressed the Medicare Annual Wellness questionnaire and have noted the following in the patient's chart:  A. Medical and social history B. Use of alcohol, tobacco or illicit drugs  C. Current medications and supplements D. Functional ability and  status E.  Nutritional status F.  Physical activity G. Advance directives H. List of other physicians I.  Hospitalizations, surgeries, and ER visits in previous 12 months J.  [place] to include hearing, vision, cognitive, depression L. Referrals and appointments - none  In addition, I have reviewed and discussed with patient certain preventive protocols, quality metrics, and best practice recommendations. A written personalized care plan for preventive services as well as general preventive health recommendations were provided to patient.  See attached scanned questionnaire for additional information.   Signed,   Lauren Dense, RN Nurse Health Advisor  Patient concerns: none

## 2017-05-19 NOTE — Patient Instructions (Signed)
Make sure to get your blood pressure taken a few times in the next few weeks.  Goal blood pressure less than 140/90 If it is persistently higher than this to call and let us know   Follow up in 6 months with Dr Mariea Clonts for routine follow up

## 2017-05-19 NOTE — Patient Instructions (Signed)
Lauren [name] , Thank you for taking time to come for your Medicare Wellness Visit. I appreciate your ongoing commitment to your health goals. Please review the following plan we discussed and let me know if I can assist you in the future.   Screening recommendations/referrals: Colonoscopy excluded, you are over age 82 Mammogram excluded, you are over age 66 Bone Density up to date Recommended yearly ophthalmology/optometry visit for glaucoma screening and checkup Recommended yearly dental visit for hygiene and checkup  Vaccinations: Influenza vaccine up to date, due 2019 fall season Pneumococcal vaccine up to date Tdap vaccine up to date, due 02/01/2025 Shingles vaccine up to date    Advanced directives: in chart  Conditions/risks identified: none  Next appointment: Lauren Dense, RN 05/22/2018 @ 2:30pm   Preventive Care 108 Years and Older, Female Preventive care refers to lifestyle choices and visits with your health care provider that can promote health and wellness. What does preventive care include?  A yearly physical exam. This is also called an annual well check.  Dental exams once or twice a year.  Routine eye exams. Ask your health care provider how often you should have your eyes checked.  Personal lifestyle choices, including:  Daily care of your teeth and gums.  Regular physical activity.  Eating a healthy diet.  Avoiding tobacco and drug use.  Limiting alcohol use.  Practicing safe sex.  Taking low-dose aspirin every day.  Taking vitamin and mineral supplements as recommended by your health care provider. What happens during an annual well check? The services and screenings done by your health care provider during your annual well check will depend on your age, overall health, lifestyle risk factors, and family history of disease. Counseling  Your health care provider may ask you questions about your:  Alcohol use.  Tobacco use.  Drug  use.  Emotional well-being.  Home and relationship well-being.  Sexual activity.  Eating habits.  History of falls.  Memory and ability to understand (cognition).  Work and work Statistician.  Reproductive health. Screening  You may have the following tests or measurements:  Height, weight, and BMI.  Blood pressure.  Lipid and cholesterol levels. These may be checked every 5 years, or more frequently if you are over 49 years old.  Skin check.  Lung cancer screening. You may have this screening every year starting at age 62 if you have a 30-pack-year history of smoking and currently smoke or have quit within the past 15 years.  Fecal occult blood test (FOBT) of the stool. You may have this test every year starting at age 58.  Flexible sigmoidoscopy or colonoscopy. You may have a sigmoidoscopy every 5 years or a colonoscopy every 10 years starting at age 48.  Hepatitis C blood test.  Hepatitis B blood test.  Sexually transmitted disease (STD) testing.  Diabetes screening. This is done by checking your blood sugar (glucose) after you have not eaten for a while (fasting). You may have this done every 1-3 years.  Bone density scan. This is done to screen for osteoporosis. You may have this done starting at age 54.  Mammogram. This may be done every 1-2 years. Talk to your health care provider about how often you should have regular mammograms. Talk with your health care provider about your test results, treatment options, and if necessary, the need for more tests. Vaccines  Your health care provider may recommend certain vaccines, such as:  Influenza vaccine. This is recommended every year.  Tetanus, diphtheria,  and acellular pertussis (Tdap, Td) vaccine. You may need a Td booster every 10 years.  Zoster vaccine. You may need this after age 65.  Pneumococcal 13-valent conjugate (PCV13) vaccine. One dose is recommended after age 61.  Pneumococcal polysaccharide  (PPSV23) vaccine. One dose is recommended after age 80. Talk to your health care provider about which screenings and vaccines you need and how often you need them. This information is not intended to replace advice given to you by your health care provider. Make sure you discuss any questions you have with your health care provider. Document Released: 03/31/2015 Document Revised: 11/22/2015 Document Reviewed: 01/03/2015 Elsevier Interactive Patient Education  2017 [place] Prevention in the Home Falls can cause injuries. They can happen to people of all ages. There are many things you can do to make your home safe and to help prevent falls. What can I do on the outside of my home?  Regularly fix the edges of walkways and driveways and fix any cracks.  Remove anything that might make you trip as you walk through a door, such as a raised step or threshold.  Trim any bushes or trees on the path to your home.  Use bright outdoor lighting.  Clear any walking paths of anything that might make someone trip, such as rocks or tools.  Regularly check to see if handrails are loose or broken. Make sure that both sides of any steps have handrails.  Any raised decks and porches should have guardrails on the edges.  Have any leaves, snow, or ice cleared regularly.  Use sand or salt on walking paths during winter.  Clean up any spills in your garage right away. This includes oil or grease spills. What can I do in the bathroom?  Use night lights.  Install grab bars by the toilet and in the tub and shower. Do not use towel bars as grab bars.  Use non-skid mats or decals in the tub or shower.  If you need to sit down in the shower, use a plastic, non-slip stool.  Keep the floor dry. Clean up any water that spills on the floor as soon as it happens.  Remove soap buildup in the tub or shower regularly.  Attach bath mats securely with double-sided non-slip rug tape.  Do not have  throw rugs and other things on the floor that can make you trip. What can I do in the bedroom?  Use night lights.  Make sure that you have a light by your bed that is easy to reach.  Do not use any sheets or blankets that are too big for your bed. They should not hang down onto the floor.  Have a firm chair that has side arms. You can use this for support while you get dressed.  Do not have throw rugs and other things on the floor that can make you trip. What can I do in the kitchen?  Clean up any spills right away.  Avoid walking on wet floors.  Keep items that you use a lot in easy-to-reach places.  If you need to reach something above you, use a strong step stool that has a grab bar.  Keep electrical cords out of the way.  Do not use floor polish or wax that makes floors slippery. If you must use wax, use non-skid floor wax.  Do not have throw rugs and other things on the floor that can make you trip. What can I do with my  stairs?  Do not leave any items on the stairs.  Make sure that there are handrails on both sides of the stairs and use them. Fix handrails that are broken or loose. Make sure that handrails are as long as the stairways.  Check any carpeting to make sure that it is firmly attached to the stairs. Fix any carpet that is loose or worn.  Avoid having throw rugs at the top or bottom of the stairs. If you do have throw rugs, attach them to the floor with carpet tape.  Make sure that you have a light switch at the top of the stairs and the bottom of the stairs. If you do not have them, ask someone to add them for you. What else can I do to help prevent falls?  Wear shoes that:  Do not have high heels.  Have rubber bottoms.  Are comfortable and fit you well.  Are closed at the toe. Do not wear sandals.  If you use a stepladder:  Make sure that it is fully opened. Do not climb a closed stepladder.  Make sure that both sides of the stepladder are  locked into place.  Ask someone to hold it for you, if possible.  Clearly mark and make sure that you can see:  Any grab bars or handrails.  First and last steps.  Where the edge of each step is.  Use tools that help you move around (mobility aids) if they are needed. These include:  Canes.  Walkers.  Scooters.  Crutches.  Turn on the lights when you go into a dark area. Replace any light bulbs as soon as they burn out.  Set up your furniture so you have a clear path. Avoid moving your furniture around.  If any of your floors are uneven, fix them.  If there are any pets around you, be aware of where they are.  Review your medicines with your doctor. Some medicines can make you feel dizzy. This can increase your chance of falling. Ask your doctor what other things that you can do to help prevent falls. This information is not intended to replace advice given to you by your health care provider. Make sure you discuss any questions you have with your health care provider. Document Released: 12/29/2008 Document Revised: 08/10/2015 Document Reviewed: 04/08/2014 Elsevier Interactive Patient Education  2017 [employer].

## 2017-05-19 NOTE — Progress Notes (Signed)
Careteam: Patient Care Team: [name], NP as PCP - General (Nurse Practitioner)   Allergies  Allergen Reactions  . Aricept [Donepezil Hcl]   . Aspirin     Upset stomach     Chief Complaint  Patient presents with  . Medical Management of Chronic Issues    Pt is being seen for a 4 month routine visit.      HPI: Patient is a 84 y.o. [name] seen in the office today for routine follow up.  Had AWV today.   htn- blood pressure elevated today- took medication today. Tries to follow low sodium diet, has prepared meals at facility and daughter thinks this is low sodium  Reports mood has been good. Still eats minimally but weight is stable. Sleeping well at night.  Continues on wellburtin and remeron   Dementia- daughter reports this is stable. Continues on namenda. No changes in functional status. Some days are better than others cognitively.   Osteoporosis- daughter feels like she is doing well without any medication and due to the strict instructions they decided not to take fosamax; taking calcium and vit D   Review of Systems:  Review of Systems  Constitutional: Negative for chills, fever and weight loss.  HENT: Negative for hearing loss.   Respiratory: Negative for cough, sputum production and shortness of breath.   Cardiovascular: Negative for chest pain, palpitations and leg swelling.  Gastrointestinal: Negative for abdominal pain, constipation, diarrhea and heartburn.  Genitourinary: Negative for dysuria, frequency and urgency.  Musculoskeletal: Negative for back pain, falls, joint pain and myalgias.  Skin: Negative.   Neurological: Negative for dizziness and headaches.  Psychiatric/Behavioral: Positive for memory loss (stable). Negative for depression. The patient does not have insomnia.     Past Medical History:  Diagnosis Date  . Anxiety   . High cholesterol   . Hypertension   . Osteoporosis   . Rosacea   . Thyroid disease   . Weight loss     Past Surgical History:  Procedure Laterality Date  . CHOLECYSTECTOMY  1959   [place]  . THYROIDECTOMY  2003   [place]  . TONSILLECTOMY AND ADENOIDECTOMY     Social History:   reports that  has never smoked. she has never used smokeless tobacco. She reports that she does not drink alcohol or use drugs.  Family History  Problem Relation Age of Onset  . Hypertension Mother   . Stroke Mother   . Stroke Father   . Lung cancer Daughter     Medications: Patient's Medications  New Prescriptions   No medications on file  Previous Medications   ATORVASTATIN (LIPITOR) 10 MG TABLET    TAKE ONE-HALF (1/2) TABLET DAILY   BUPROPION (WELLBUTRIN XL) 150 MG 24 HR TABLET    TAKE 1 TABLET DAILY IN THE MORNING FOR ANXIETY   CALCIUM CARBONATE (OSCAL) 1500 (600 CA) MG TABS TABLET    Take by mouth 2 (two) times daily with a meal.   CALCIUM CARBONATE-VITAMIN D (CALTRATE 600+D) 600-400 MG-UNIT TABLET    Take 1 tablet by mouth 2 (two) times daily.   LEVOTHYROXINE (SYNTHROID, LEVOTHROID) 100 MCG TABLET    TAKE 1 TABLET DAILY BEFORE BREAKFAST   LOSARTAN (COZAAR) 100 MG TABLET    TAKE 1 TABLET DAILY   MEMANTINE (NAMENDA XR) 14 MG CP24 24 HR CAPSULE    TAKE 1 CAPSULE DAILY FOR MEMORY   METOPROLOL TARTRATE (LOPRESSOR) 25 MG TABLET    TAKE 1 TABLET TWICE A  DAY FOR BLOOD PRESSURE   MIRTAZAPINE (REMERON) Lauren MG TABLET    TAKE 1 TABLET ONCE DAILY DUE TO WEIGHT LOSS TO INCREASE APPETITE AND ANXIETY, DEPRESSION   MULTIPLE VITAMIN (MULTIVITAMIN WITH MINERALS) TABS TABLET    Take 2 tablets by mouth daily.    VITAMIN D, ERGOCALCIFEROL, (DRISDOL) 50000 UNITS CAPS CAPSULE    Take 1 capsule (50,000 Units total) by mouth every 7 (seven) days.  Modified Medications   No medications on file  Discontinued Medications   ALENDRONATE (FOSAMAX) 70 MG TABLET    Take 1 tablet (70 mg total) by mouth once a week. Take with a full glass of water on an empty stomach. Do not lay down for 30 minutes.     Physical Exam:  Vitals:    05/19/17 1515 05/19/17 1519  BP: (!) 170/78 (!) 170/84  Pulse: 67   Temp: 97.8 F (36.6 C)   SpO2: 95%   Weight: 137 lb (62.1 kg)   Height: 5\' 6"  (1.676 m)    Body mass index is 22.11 kg/m.  Physical Exam  Constitutional: She is oriented to person, place, and time. She appears well-developed and well-nourished. No distress.  HENT:  Head: Normocephalic and atraumatic.  Neck: Normal range of motion. Neck supple.  Cardiovascular: Normal rate, regular rhythm and normal heart sounds.  No murmur heard. Pulmonary/Chest: Effort normal and breath sounds normal. No respiratory distress. She has no wheezes.  Abdominal: Soft. Bowel sounds are normal. She exhibits no distension. There is no tenderness.  Musculoskeletal: She exhibits no edema.  Neurological: She is alert and oriented to person, place, and time.  Skin: Skin is warm and dry.  Psychiatric: She has a normal mood and affect.    Labs reviewed: Basic Metabolic Panel: Recent Labs    06/13/16 1038 08/08/16 0203 10/21/16 1438 01/23/17 1028  NA  --   --  144 144  K  --   --  3.7 3.8  CL  --   --  109 107  CO2  --   --  24 29  GLUCOSE  --   --  81 92  BUN  --   --  20 18  CREATININE  --   --  1.18* 1.22*  CALCIUM  --   --  7.7* 8.1*  TSH 8.69* 1.74  --   --    Liver Function Tests: Recent Labs    10/21/16 1438  AST 21  ALT 16  ALKPHOS 86  BILITOT 0.5  PROT 6.4  ALBUMIN 3.8   No results for input(s): LIPASE, AMYLASE in the last 8760 hours. No results for input(s): AMMONIA in the last 8760 hours. CBC: Recent Labs    10/21/16 1438  WBC 6.8  NEUTROABS 4,760  HGB 13.5  HCT 40.3  MCV 94.6  PLT 174   Lipid Panel: Recent Labs    01/23/17 1028  CHOL 173  HDL 46*  TRIG 132  CHOLHDL 3.8   TSH: Recent Labs    06/13/16 1038 08/08/16 0203  TSH 8.69* 1.74   A1C: Lab Results  Component Value Date   HGBA1C (H) 11/23/2009    5.7 (NOTE)  According to the ADA Clinical Practice Recommendations for 2011, when HbA1c is used as a screening test:   >=6.5%   Diagnostic of Diabetes Mellitus           (if abnormal result  is confirmed)  5.7-6.4%   Increased risk of developing Diabetes Mellitus  References:Diagnosis and Classification of Diabetes Mellitus,Diabetes YDXA,1287,86([facility] 1):S62-S69 and Standards of Medical Care in         Diabetes - 2011,Diabetes NOBS,9628,36  (Suppl 1):S11-S61.     Assessment/Plan 1. Hypothyroidism, unspecified type -has been maintained on synthroid 100 mcg daily, will follow up TSH  2. Essential hypertension -blood pressure is elevated today, previously well controlled. Would like to check blood pressure out of office before adjusting medication. Can have bp taken at facility and plans to do this over the next few weeks and will notify if bp remains over 140/90 - CBC with Differential/Platelets  3. Vascular dementia without behavioral disturbance Stable, no acute changes in cognitive or functional status. Continues on namenda.   4. Osteoporosis without current pathological fracture, unspecified osteoporosis type Did not want to be on fosamax due to strict precautions with administration of medication, willing to take prolia if she can afford, will get prior auth at this time.    5. Loss of weight Weight has been stable. Continues on remeron   6. High cholesterol LDL 104, continues on Lipitor daily with dietary modifications  - COMPLETE METABOLIC PANEL WITH GFR  7. Vitamin D deficiency - Vitamin D, Ergocalciferol, (DRISDOL) 50000 units CAPS capsule; Take 1 capsule (50,000 Units total) by mouth every 7 (seven) days.  Dispense: 12 capsule; Refill: 4  Next appt: follow up in 6 months with Dr Sharee Holster K. Harle Battiest  [facility] [phone] 8 am - 5 pm) [phone] (after hours)

## 2017-05-20 LAB — COMPLETE METABOLIC PANEL WITH GFR
AG Ratio: 1.5 (calc) (ref 1.0–2.5)
ALT: 13 U/L (ref 6–29)
AST: 21 U/L (ref 10–35)
Albumin: 4 g/dL (ref 3.6–5.1)
Alkaline phosphatase (APISO): 91 U/L (ref 33–130)
BILIRUBIN TOTAL: 0.7 mg/dL (ref 0.2–1.2)
BUN / CREAT RATIO: 15 (calc) (ref 6–22)
BUN: 23 mg/dL (ref 7–25)
CHLORIDE: 105 mmol/L (ref 98–110)
CO2: 32 mmol/L (ref 20–32)
Calcium: 8.4 mg/dL — ABNORMAL LOW (ref 8.6–10.4)
Creat: 1.54 mg/dL — ABNORMAL HIGH (ref 0.60–0.88)
GFR, EST AFRICAN AMERICAN: 34 mL/min/{1.73_m2} — AB (ref >=60)
GFR, Est Non African American: 29 mL/min/{1.73_m2} — ABNORMAL LOW (ref >=60)
GLOBULIN: 2.6 g/dL (ref 1.9–3.7)
Glucose, Bld: 96 mg/dL (ref 65–139)
POTASSIUM: 4.4 mmol/L (ref 3.5–5.3)
SODIUM: 145 mmol/L (ref 135–146)
TOTAL PROTEIN: 6.6 g/dL (ref 6.1–8.1)

## 2017-05-20 LAB — CBC WITH DIFFERENTIAL/PLATELET
BASOS ABS: 8 {cells}/uL (ref 0–200)
Basophils Relative: 0.1 %
EOS PCT: 2.7 %
Eosinophils Absolute: 211 cells/uL (ref 15–500)
HCT: 41.7 % (ref 35.0–45.0)
Hemoglobin: 14 g/dL (ref 11.7–15.5)
Lymphs Abs: 1396 cells/uL (ref 850–3900)
MCH: 31 pg (ref 27.0–33.0)
MCHC: 33.6 g/dL (ref 32.0–36.0)
MCV: 92.3 fL (ref 80.0–100.0)
MONOS PCT: 9.5 %
MPV: 11.8 fL (ref 7.5–12.5)
NEUTROS ABS: 5444 {cells}/uL (ref 1500–7800)
NEUTROS PCT: 69.8 %
PLATELETS: 165 10*3/uL (ref 140–400)
RBC: 4.52 10*6/uL (ref 3.80–5.10)
RDW: 13.1 % (ref 11.0–15.0)
TOTAL LYMPHOCYTE: 17.9 %
WBC mixed population: 741 cells/uL (ref 200–950)
WBC: 7.8 10*3/uL (ref 3.8–10.8)

## 2017-05-20 LAB — TSH: TSH: 1.14 m[IU]/L (ref 0.40–4.50)

## 2017-05-26 ENCOUNTER — Telehealth: Payer: Self-pay | Admitting: *Deleted

## 2017-05-26 MED ORDER — TELMISARTAN 80 MG PO TABS: 160.0000 mg | ORAL_TABLET | Freq: Every day | ORAL | 1 refills | Status: DC

## 2017-05-26 NOTE — Telephone Encounter (Signed)
Patient daughter notified and agreed.  

## 2017-05-26 NOTE — Telephone Encounter (Signed)
Lauren [name], daughter called and stated they received a letter from pharmacy stating there had been a recall on patient's Losartan. Daughter would like it changed. Daughter stated that she has taken patient's BP twice since last seen. Please Advise.  05/21/17- 172/81 05/24/17- 166/82

## 2017-05-26 NOTE — Telephone Encounter (Signed)
To stop losartan and sent in for telmisartan 160 mg by mouth daily; cont to take blood pressure with medication change and if remains elevated will have to add an additional medication

## 2017-05-30 ENCOUNTER — Other Ambulatory Visit: Payer: Self-pay | Admitting: *Deleted

## 2017-05-30 MED ORDER — TELMISARTAN 80 MG PO TABS: 160.0000 mg | ORAL_TABLET | Freq: Every day | ORAL | 1 refills | Status: DC

## 2017-05-30 NOTE — Telephone Encounter (Signed)
Daughter, [name] called and stated that the blood pressure was called into Mail order and she needs to start giving now. Wants a supply called to local pharmacy until she gets the mail order. Faxed.

## 2017-06-05 ENCOUNTER — Telehealth: Payer: Self-pay | Admitting: *Deleted

## 2017-06-05 ENCOUNTER — Other Ambulatory Visit: Payer: Self-pay | Admitting: Nurse Practitioner

## 2017-06-05 ENCOUNTER — Other Ambulatory Visit: Payer: Self-pay

## 2017-06-05 MED ORDER — LOSARTAN POTASSIUM 100 MG PO TABS: 100.0000 mg | ORAL_TABLET | Freq: Every day | ORAL | 3 refills | Status: DC

## 2017-06-05 NOTE — Telephone Encounter (Signed)
Patient's daughter called stating rx for losartan was sent to the wrong [facility]. Patient has moved and rx should be sent to the [facility] near her daughter's house.   I updated pharmacy list and sent RX to [facility] on [address]

## 2017-06-05 NOTE — Telephone Encounter (Signed)
Patient daughter, [name] called and stated that the medication Losartan is too expensive at [facility]. She called the insurance carrier and they stated that she could [facility] if she didn't want to get it through [facility] and it would be cheaper. Would like Rx to be sent [facility] [address]. Faxed.

## 2017-06-05 NOTE — Telephone Encounter (Signed)
Patient daughter, [name] called and stated that patient's BP medication was changed to Telmisartan. Patient has been taking it for 5 days now and daughter visited her yesterday and she can hardly put one foot in front of the other. Stated that patient is Lethargic. Thinks this is coming from the new BP medication. Stated that she took her BP twice since being on it and one reading was 172/81 and yesterday it was 166/82. If new medication needs to be called in please send to [facility]. Please Advise.

## 2017-06-05 NOTE — Telephone Encounter (Signed)
I would think she would have this reaction if her blood pressure was too low but remains elevated, lets change her back to, lets place her back on losartan 100 mg daily- there are lot# that are not recalled so pharmacy should give her one that is okay. If her symptoms continue she needs to be seen because this is not typical.

## 2017-06-05 NOTE — Telephone Encounter (Signed)
Daughter, [name] notified and agreed. Informed her that Janett Billow already faxed Rx to pharmacy for the Losartan. Informed her to discontinue the Telmisartan. Agreed.

## 2017-08-01 ENCOUNTER — Other Ambulatory Visit: Payer: Self-pay | Admitting: Nurse Practitioner

## 2017-08-01 ENCOUNTER — Telehealth: Payer: Self-pay

## 2017-08-01 MED ORDER — AMLODIPINE BESYLATE 5 MG PO TABS: 5.0000 mg | ORAL_TABLET | Freq: Every day | ORAL | 0 refills | Status: DC

## 2017-08-01 NOTE — Telephone Encounter (Signed)
Lets add norvasc 5 mg by mouth daily for blood pressure, she will need a follow up in office to review blood pressure and may need to adjust medication further in ~1-2 weeks

## 2017-08-01 NOTE — Telephone Encounter (Signed)
Discussed response with Cathy. Cathy verbalized understanding of instructions. RX sent to pharmacy. Scheduled appointment to follow-up on 08/12/17.

## 2017-08-01 NOTE — Telephone Encounter (Signed)
Incoming call from patient's daughter: Angela Nevin  Patient went to Dentist yesterday, Dentist would not work on patient due to elevated B/P readings. B/P was 215/109 initially, Dentist waited to recheck and it was 198/105.  Patient lives in an independent facility and B/P was taken this am 215/101. Patient with no other sumptoms  Patient's daughter is concerned about B/P readings, patient is taking medication as prescribed. Patient's daughter set patient's medication up in weekly pill box to confirm compliance.

## 2017-08-02 ENCOUNTER — Other Ambulatory Visit: Payer: Self-pay | Admitting: Nurse Practitioner

## 2017-08-06 ENCOUNTER — Telehealth: Payer: Self-pay | Admitting: *Deleted

## 2017-08-06 NOTE — Telephone Encounter (Signed)
Patient daughter, [name] called and stated that patient's BP medication was increased last week. Patient's Bp has been running good since change but daughter stated that she was with patient yesterday and her feet are slightly swollen and patient was complaining that it hurt to walk on them. Daughter is soaking them in [facility] but wonders if there is anything else she can do for this.  Please Advise.   Patient has an appointment to follow up on 08/12/2017.

## 2017-08-06 NOTE — Telephone Encounter (Signed)
Swelling most likely due to due medication. She can wear compression hose, apply in am and remove at bedtime to see if this helps with the swelling. Make sure low sodium diet and to keep legs elevated when sitting.

## 2017-08-06 NOTE — Telephone Encounter (Signed)
Patient daughter notified and agreed.  

## 2017-08-12 ENCOUNTER — Telehealth: Payer: Self-pay | Admitting: *Deleted

## 2017-08-12 ENCOUNTER — Ambulatory Visit (INDEPENDENT_AMBULATORY_CARE_PROVIDER_SITE_OTHER): Payer: Medicare HMO | Admitting: Nurse Practitioner

## 2017-08-12 ENCOUNTER — Encounter: Payer: Self-pay | Admitting: Nurse Practitioner

## 2017-08-12 VITALS — BP 152/84 | HR 70 | Temp 98.1°F | Ht 66.0 in | Wt 133.0 lb

## 2017-08-12 DIAGNOSIS — I10 Essential (primary) hypertension: Secondary | ICD-10-CM

## 2017-08-12 MED ORDER — NEBIVOLOL HCL 5 MG PO TABS: 5.0000 mg | ORAL_TABLET | Freq: Every day | ORAL | 0 refills | Status: DC

## 2017-08-12 MED ORDER — LOSARTAN POTASSIUM 100 MG PO TABS: 100.0000 mg | ORAL_TABLET | Freq: Every day | ORAL | 3 refills | Status: DC

## 2017-08-12 MED ORDER — NEBIVOLOL HCL 5 MG PO TABS: 5.0000 mg | ORAL_TABLET | Freq: Every day | ORAL | 1 refills | Status: DC

## 2017-08-12 NOTE — Progress Notes (Signed)
Careteam: Patient Care Team: [name], NP as PCP - General (Nurse Practitioner)  Advanced Directive information Does Patient Have a Medical Advance Directive?: Yes, Type of Advance Directive: Living will  Allergies  Allergen Reactions  . Aricept [Donepezil Hcl]   . Aspirin     Upset stomach     Chief Complaint  Patient presents with  . Follow-up    Pt is being seen to follow up on blood pressure and BP medication changes.   . Medical clearance    Pt needs a letter stating that she is clear to have dental work done.      HPI: Patient is a 84 y.o. female seen in the office today to follow up blood pressure.  blood pressure has been borderline however in the last few months has gotten worse. Original with recall on losartan she was placed on telmisartan but had side effects with this so was placed back on losartan 100 mg. She did well with this for a little while then went to Dentist office and blood pressure was too elevated to work on her.  Pt had several episodes of elevated blood pressures therefore Norvasc 5 mg by mouth daily was prescribed.  Since she has been on Norvasc she has had increased LE edema.  Recommended to elevate legs, avoid adding sodium and adding compression hose. She has had good results with elevation, her daughter ordered her compression hose online but she has not gotten them yet.   Blood pressures have been 150s/70s No headaches and chest pains. No vision changes.   Needing dental work done but now dentist wants medical clearance due to elevated blood pressure.    Review of Systems:  Review of Systems  Constitutional: Negative for chills, fever and weight loss.  HENT: Negative for hearing loss.   Respiratory: Negative for cough, sputum production and shortness of breath.   Cardiovascular: Positive for leg swelling. Negative for chest pain and palpitations.  Gastrointestinal: Negative for abdominal pain, constipation, diarrhea and  heartburn.  Musculoskeletal: Negative for back pain, joint pain and myalgias.  Skin: Negative.   Neurological: Negative for dizziness and headaches.  Psychiatric/Behavioral: Positive for memory loss (stable). Negative for depression.    Past Medical History:  Diagnosis Date  . Anxiety   . High cholesterol   . Hypertension   . Osteoporosis   . Rosacea   . Thyroid disease   . Weight loss    Past Surgical History:  Procedure Laterality Date  . CHOLECYSTECTOMY  1959   [place]  . THYROIDECTOMY  2003   [place]  . TONSILLECTOMY AND ADENOIDECTOMY     Social History:   reports that she has never smoked. She has never used smokeless tobacco. She reports that she does not drink alcohol or use drugs.  Family History  Problem Relation Age of Onset  . Hypertension Mother   . Stroke Mother   . Stroke Father   . Lung cancer Daughter     Medications: Patient's Medications  New Prescriptions   NEBIVOLOL (BYSTOLIC) 5 MG TABLET    Take 1 tablet (5 mg total) by mouth daily.  Previous Medications   AMLODIPINE (NORVASC) 5 MG TABLET    Take 1 tablet (5 mg total) by mouth daily.   ATORVASTATIN (LIPITOR) 10 MG TABLET    TAKE ONE-HALF (1/2) TABLET DAILY   BUPROPION (WELLBUTRIN XL) 150 MG 24 HR TABLET    TAKE 1 TABLET DAILY IN THE MORNING FOR ANXIETY   CALCIUM  CARBONATE-VITAMIN D (CALTRATE 600+D) 600-400 MG-UNIT TABLET    Take 1 tablet by mouth 2 (two) times daily.   LEVOTHYROXINE (SYNTHROID, LEVOTHROID) 100 MCG TABLET    TAKE 1 TABLET DAILY BEFORE BREAKFAST   MEMANTINE (NAMENDA XR) 14 MG CP24 24 HR CAPSULE    TAKE 1 CAPSULE DAILY FOR MEMORY   METOPROLOL TARTRATE (LOPRESSOR) 25 MG TABLET    TAKE 1 TABLET TWICE A DAY FOR BLOOD PRESSURE   MIRTAZAPINE (REMERON) 15 MG TABLET    TAKE 1 TABLET ONCE DAILY DUE TO WEIGHT LOSS TO INCREASE APPETITE AND ANXIETY, DEPRESSION   MULTIPLE VITAMIN (MULTIVITAMIN WITH MINERALS) TABS TABLET    Take 2 tablets by mouth daily.    VITAMIN D, ERGOCALCIFEROL, (DRISDOL)  50000 UNITS CAPS CAPSULE    Take 1 capsule (50,000 Units total) by mouth every 7 (seven) days.  Modified Medications   Modified Medication Previous Medication   LOSARTAN (COZAAR) 100 MG TABLET losartan (COZAAR) 100 MG tablet      Take 1 tablet (100 mg total) by mouth daily.    Take 1 tablet (100 mg total) by mouth daily.  Discontinued Medications   CALCIUM CARBONATE (OSCAL) 1500 (600 CA) MG TABS TABLET    Take by mouth 2 (two) times daily with a meal.     Physical Exam:  Vitals:   08/12/17 1356  BP: (!) 152/84  Pulse: 70  Temp: 98.1 F (36.7 C)  TempSrc: Oral  SpO2: 95%  Weight: 133 lb (60.3 kg)  Height: 5\' 6"  (1.676 m)   Body mass index is 21.47 kg/m.  Physical Exam  Constitutional: She is oriented to person, place, and time. She appears well-developed and well-nourished. No distress.  HENT:  Head: Normocephalic and atraumatic.  Neck: Normal range of motion. Neck supple.  Cardiovascular: Normal rate, regular rhythm and normal heart sounds.  No murmur heard. Pulmonary/Chest: Effort normal and breath sounds normal. No respiratory distress. She has no wheezes.  Abdominal: Soft. Bowel sounds are normal. She exhibits no distension. There is no tenderness.  Musculoskeletal: She exhibits edema (1+ bilaterally).  Neurological: She is alert and oriented to person, place, and time.  Skin: Skin is warm and dry.  Psychiatric: She has a normal mood and affect.    Labs reviewed: Basic Metabolic Panel: Recent Labs    10/21/16 1438 01/23/17 1028 05/19/17 1600  NA 144 144 145  K 3.7 3.8 4.4  CL 109 107 105  CO2 24 29 32  GLUCOSE 81 92 96  BUN 20 18 23   CREATININE 1.18* 1.22* 1.54*  CALCIUM 7.7* 8.1* 8.4*  TSH  --   --  1.14   Liver Function Tests: Recent Labs    10/21/16 1438 05/19/17 1600  AST 21 21  ALT 16 13  ALKPHOS 86  --   BILITOT 0.5 0.7  PROT 6.4 6.6  ALBUMIN 3.8  --    No results for input(s): LIPASE, AMYLASE in the last 8760 hours. No results for  input(s): AMMONIA in the last 8760 hours. CBC: Recent Labs    10/21/16 1438 05/19/17 1600  WBC 6.8 7.8  NEUTROABS 4,760 5,444  HGB 13.5 14.0  HCT 40.3 41.7  MCV 94.6 92.3  PLT 174 165   Lipid Panel: Recent Labs    01/23/17 1028  CHOL 173  HDL 46*  LDLCALC 104*  TRIG 132  CHOLHDL 3.8   TSH: Recent Labs    05/19/17 1600  TSH 1.14   A1C: Lab Results  Component Value Date  HGBA1C (H) 11/23/2009    5.7 (NOTE)                                                                       According to the ADA Clinical Practice Recommendations for 2011, when HbA1c is used as a screening test:   >=6.5%   Diagnostic of Diabetes Mellitus           (if abnormal result  is confirmed)  5.7-6.4%   Increased risk of developing Diabetes Mellitus  References:Diagnosis and Classification of Diabetes Mellitus,Diabetes VZSM,2707,86([facility] 1):S62-S69 and Standards of Medical Care in         Diabetes - 2011,Diabetes EEFE,0712,19  (Suppl 1):S11-S61.     Assessment/Plan 1. Essential hypertension Improved but not at goal. Will continue norvasc 5 mg at this time. To use compression hose, low sodium diet and elevation to help LE edema.  -will stop lopressor 25 mg by mouth twice daily and start bystolic 5 mg daily to avoid bradycardia.  - nebivolol (BYSTOLIC) 5 MG tablet; Take 1 tablet (5 mg total) by mouth daily.  Dispense: 90 tablet; Refill: 0 - losartan (COZAAR) 100 MG tablet; Take 1 tablet (100 mg total) by mouth daily.  Dispense: 90 tablet; Refill: 3  Next appt: 2-3 weeks for blood pressure follow up  [place] [place], [facility] [phone]

## 2017-08-12 NOTE — Patient Instructions (Addendum)
Start bystolic 5 mg by mouth daily for blood pressure To STOP metoprolol 25 mg by mouth twice daily  Continue norvasc and losartan for now Continue to elevate legs and compression hose.    DASH Eating Plan DASH stands for "Dietary Approaches to Stop Hypertension." The DASH eating plan is a healthy eating plan that has been shown to reduce high blood pressure (hypertension). It may also reduce your risk for type 2 diabetes, heart disease, and stroke. The DASH eating plan may also help with weight loss. What are tips for following this plan? General guidelines  Avoid eating more than 2,300 mg (milligrams) of salt (sodium) a day. If you have hypertension, you may need to reduce your sodium intake to 1,500 mg a day.  Limit alcohol intake to no more than 1 drink a day for nonpregnant women and 2 drinks a day for men. One drink equals 12 oz of beer, 5 oz of wine, or 1 oz of hard liquor.  Work with your health care provider to maintain a healthy body weight or to lose weight. Ask what an ideal weight is for you.  Get at least 30 minutes of exercise that causes your heart to beat faster (aerobic exercise) most days of the week. Activities may include walking, swimming, or biking.  Work with your health care provider or diet and nutrition specialist (dietitian) to adjust your eating plan to your individual calorie needs. Reading food labels  Check food labels for the amount of sodium per serving. Choose foods with less than 5 percent of the Daily Value of sodium. Generally, foods with less than 300 mg of sodium per serving fit into this eating plan.  To find whole grains, look for the word "whole" as the first word in the ingredient list. Shopping  Buy products labeled as "low-sodium" or "no salt added."  Buy fresh foods. Avoid canned foods and premade or frozen meals. Cooking  Avoid adding salt when cooking. Use salt-free seasonings or herbs instead of table salt or sea salt. Check with  your health care provider or pharmacist before using salt substitutes.  Do not fry foods. Cook foods using healthy methods such as baking, boiling, grilling, and broiling instead.  Cook with heart-healthy oils, such as olive, canola, soybean, or sunflower oil. Meal planning   Eat a balanced diet that includes: ? 5 or more servings of fruits and vegetables each day. At each meal, try to fill half of your plate with fruits and vegetables. ? Up to 6-8 servings of whole grains each day. ? Less than 6 oz of lean meat, poultry, or fish each day. A 3-oz serving of meat is about the same size as a deck of cards. One egg equals 1 oz. ? 2 servings of low-fat dairy each day. ? A serving of nuts, seeds, or beans 5 times each week. ? Heart-healthy fats. Healthy fats called Omega-3 fatty acids are found in foods such as flaxseeds and coldwater fish, like sardines, salmon, and mackerel.  Limit how much you eat of the following: ? Canned or prepackaged foods. ? Food that is high in trans fat, such as fried foods. ? Food that is high in saturated fat, such as fatty meat. ? Sweets, desserts, sugary drinks, and other foods with added sugar. ? Full-fat dairy products.  Do not salt foods before eating.  Try to eat at least 2 vegetarian meals each week.  Eat more home-cooked food and less restaurant, buffet, and fast food.  When eating  at a restaurant, ask that your food be prepared with less salt or no salt, if possible. What foods are recommended? The items listed may not be a complete list. Talk with your dietitian about what dietary choices are best for you. Grains Whole-grain or whole-wheat bread. Whole-grain or whole-wheat pasta. Brown rice. [place]. Bulgur. Whole-grain and low-sodium cereals. Pita bread. Low-fat, low-sodium crackers. Whole-wheat flour tortillas. Vegetables Fresh or frozen vegetables (raw, steamed, roasted, or grilled). Low-sodium or reduced-sodium tomato and vegetable  juice. Low-sodium or reduced-sodium tomato sauce and tomato paste. Low-sodium or reduced-sodium canned vegetables. Fruits All fresh, dried, or frozen fruit. Canned fruit in natural juice (without added sugar). Meat and other protein foods Skinless chicken or [place]. Ground chicken or [place]. Pork with fat trimmed off. Fish and seafood. Egg whites. Dried beans, peas, or lentils. Unsalted nuts, nut butters, and seeds. Unsalted canned beans. Lean cuts of beef with fat trimmed off. Low-sodium, lean deli meat. Dairy Low-fat (1%) or fat-free (skim) milk. Fat-free, low-fat, or reduced-fat cheeses. Nonfat, low-sodium ricotta or cottage cheese. Low-fat or nonfat yogurt. Low-fat, low-sodium cheese. Fats and oils Soft margarine without trans fats. Vegetable oil. Low-fat, reduced-fat, or light mayonnaise and salad dressings (reduced-sodium). Canola, safflower, olive, soybean, and sunflower oils. Avocado. Seasoning and other foods Herbs. Spices. Seasoning mixes without salt. Unsalted popcorn and pretzels. Fat-free sweets. What foods are not recommended? The items listed may not be a complete list. Talk with your dietitian about what dietary choices are best for you. Grains Baked goods made with fat, such as croissants, muffins, or some breads. Dry pasta or rice meal packs. Vegetables Creamed or fried vegetables. Vegetables in a cheese sauce. Regular canned vegetables (not low-sodium or reduced-sodium). Regular canned tomato sauce and paste (not low-sodium or reduced-sodium). Regular tomato and vegetable juice (not low-sodium or reduced-sodium). Angie Fava. Olives. Fruits Canned fruit in a light or heavy syrup. Fried fruit. Fruit in cream or butter sauce. Meat and other protein foods Fatty cuts of meat. Ribs. Fried meat. Berniece Salines. Sausage. Bologna and other processed lunch meats. Salami. Fatback. Hotdogs. Bratwurst. Salted nuts and seeds. Canned beans with added salt. Canned or smoked fish. Whole eggs or egg yolks.  Chicken or [place] with skin. Dairy Whole or 2% milk, cream, and half-and-half. Whole or full-fat cream cheese. Whole-fat or sweetened yogurt. Full-fat cheese. Nondairy creamers. Whipped toppings. Processed cheese and cheese spreads. Fats and oils Butter. Stick margarine. Lard. Shortening. Ghee. Bacon fat. Tropical oils, such as coconut, palm kernel, or palm oil. Seasoning and other foods Salted popcorn and pretzels. Onion salt, garlic salt, seasoned salt, table salt, and sea salt. Worcestershire sauce. Tartar sauce. Barbecue sauce. Teriyaki sauce. Soy sauce, including reduced-sodium. Steak sauce. Canned and packaged gravies. Fish sauce. Oyster sauce. Cocktail sauce. Horseradish that you find on the shelf. Ketchup. Mustard. Meat flavorings and tenderizers. Bouillon cubes. Hot sauce and Tabasco sauce. Premade or packaged marinades. Premade or packaged taco seasonings. Relishes. Regular salad dressings. Where to find more information:  National Heart, Lung, and [place]: https://wilson-eaton.com/  American Heart Association: www.heart.org Summary  The DASH eating plan is a healthy eating plan that has been shown to reduce high blood pressure (hypertension). It may also reduce your risk for type 2 diabetes, heart disease, and stroke.  With the DASH eating plan, you should limit salt (sodium) intake to 2,300 mg a day. If you have hypertension, you may need to reduce your sodium intake to 1,500 mg a day.  When on the DASH eating plan, aim to  eat more fresh fruits and vegetables, whole grains, lean proteins, low-fat dairy, and heart-healthy fats.  Work with your health care provider or diet and nutrition specialist (dietitian) to adjust your eating plan to your individual calorie needs. This information is not intended to replace advice given to you by your health care provider. Make sure you discuss any questions you have with your health care provider. Document Released: 02/21/2011 Document Revised:  02/26/2016 Document Reviewed: 02/26/2016 Elsevier Interactive Patient Education  [employer].

## 2017-08-12 NOTE — Telephone Encounter (Signed)
Patient was seen today and prescribed Bystolic. It is not covered by insurance and will cost $172/month. Daughter is wanting it changed to something different. Please Advise.

## 2017-08-13 NOTE — Telephone Encounter (Signed)
To stop bystolic and resume METOPROLOL TARTRATE (LOPRESSOR) lets increase dose to 50 MG TABLET twice daily and have her check BP and HR daily after medication. To keep follow up appt.

## 2017-08-14 MED ORDER — METOPROLOL TARTRATE 50 MG PO TABS: 50.0000 mg | ORAL_TABLET | Freq: Two times a day (BID) | ORAL | 5 refills | Status: DC

## 2017-08-14 NOTE — Telephone Encounter (Signed)
Spoke with patient's daughter, discussed Jessica's response. Medication list updated, rx sent to [facility] as requested for 30 day supply.

## 2017-08-14 NOTE — Telephone Encounter (Signed)
LMOM to return call.

## 2017-08-17 ENCOUNTER — Other Ambulatory Visit: Payer: Self-pay | Admitting: Nurse Practitioner

## 2017-08-17 DIAGNOSIS — I10 Essential (primary) hypertension: Secondary | ICD-10-CM

## 2017-09-01 ENCOUNTER — Ambulatory Visit (INDEPENDENT_AMBULATORY_CARE_PROVIDER_SITE_OTHER): Payer: Medicare HMO | Admitting: Nurse Practitioner

## 2017-09-01 ENCOUNTER — Encounter: Payer: Self-pay | Admitting: Nurse Practitioner

## 2017-09-01 VITALS — BP 142/76 | HR 70 | Temp 98.4°F | Ht 66.0 in | Wt 136.0 lb

## 2017-09-01 DIAGNOSIS — R634 Abnormal weight loss: Secondary | ICD-10-CM | POA: Diagnosis not present

## 2017-09-01 DIAGNOSIS — N184 Chronic kidney disease, stage 4 (severe): Secondary | ICD-10-CM | POA: Diagnosis not present

## 2017-09-01 DIAGNOSIS — R69 Illness, unspecified: Secondary | ICD-10-CM | POA: Diagnosis not present

## 2017-09-01 DIAGNOSIS — F0150 Vascular dementia without behavioral disturbance: Secondary | ICD-10-CM | POA: Diagnosis not present

## 2017-09-01 DIAGNOSIS — F419 Anxiety disorder, unspecified: Secondary | ICD-10-CM | POA: Diagnosis not present

## 2017-09-01 DIAGNOSIS — N1831 Chronic kidney disease, stage 3a: Secondary | ICD-10-CM | POA: Insufficient documentation

## 2017-09-01 DIAGNOSIS — I10 Essential (primary) hypertension: Secondary | ICD-10-CM

## 2017-09-01 DIAGNOSIS — E559 Vitamin D deficiency, unspecified: Secondary | ICD-10-CM | POA: Diagnosis not present

## 2017-09-01 MED ORDER — HYDRALAZINE HCL 25 MG PO TABS: 25.0000 mg | ORAL_TABLET | Freq: Two times a day (BID) | ORAL | 1 refills | Status: DC

## 2017-09-01 MED ORDER — METOPROLOL TARTRATE 50 MG PO TABS: 50.0000 mg | ORAL_TABLET | Freq: Two times a day (BID) | ORAL | 1 refills | Status: DC

## 2017-09-01 NOTE — Patient Instructions (Addendum)
STOP amlodipine/norvac  START hydralazine 25 mg by mouth twice daily   To check blood pressure three times weekly, goal is for blood pressure to be less than 140/90 If blood pressure too low, less than 100/50 to notify   Can call in 2 weeks to give readings to medical assistant and they will relay to [facility]

## 2017-09-01 NOTE — Progress Notes (Signed)
Careteam: Patient Care Team: [name], NP as PCP - General (Nurse Practitioner)  Advanced Directive information Does Patient Have a Medical Advance Directive?: Yes, Type of Advance Directive: [place];Living will  Allergies  Allergen Reactions  . Aricept [Donepezil Hcl]   . Aspirin     Upset stomach     Chief Complaint  Patient presents with  . Follow-up    Pt is being seen for a 2 week follow up on blood pressure.      HPI: Patient is a 84 y.o. female seen in the office today for blood pressure check.  Pt has had elevated blood pressure on losartan 100 mg by mouth daily.  norvasc was prescribed 5 mg by mouth daily. She had lower extremity edema with this now it is worse than what it previously was.  Ordered compression hose but can not get hose on.  Has lost her cane, keeps leaving them places. Now feet are bothering her.  Blood pressure remained elevated at last OV and metoprolol was increased to 50 mg by mouth twice daily. Blood pressure ranging from 126-161/75-83.  HR 59-76 Daughter is helping her remember her pills in the evening.  In the morning she is good to remember.   Anxiety/depression- remains stable. Continues on bupropion   Review of Systems:  Review of Systems  Unable to perform ROS: Dementia    Past Medical History:  Diagnosis Date  . Anxiety   . High cholesterol   . Hypertension   . Osteoporosis   . Rosacea   . Thyroid disease   . Weight loss    Past Surgical History:  Procedure Laterality Date  . CHOLECYSTECTOMY  1959   [place]  . THYROIDECTOMY  2003   [place]  . TONSILLECTOMY AND ADENOIDECTOMY     Social History:   reports that she has never smoked. She has never used smokeless tobacco. She reports that she does not drink alcohol or use drugs.  Family History  Problem Relation Age of Onset  . Hypertension Mother   . Stroke Mother   . Stroke Father   . Lung cancer Daughter      Medications: Patient's Medications  New Prescriptions   No medications on file  Previous Medications   AMLODIPINE (NORVASC) 5 MG TABLET    TAKE 1 TABLET BY MOUTH EVERY DAY   ATORVASTATIN (LIPITOR) 10 MG TABLET    TAKE ONE-HALF (1/2) TABLET DAILY   BUPROPION (WELLBUTRIN XL) 150 MG 24 HR TABLET    TAKE 1 TABLET DAILY IN THE MORNING FOR ANXIETY   CALCIUM CARBONATE-VITAMIN D (CALTRATE 600+D) 600-400 MG-UNIT TABLET    Take 1 tablet by mouth 2 (two) times daily.   LEVOTHYROXINE (SYNTHROID, LEVOTHROID) 100 MCG TABLET    TAKE 1 TABLET DAILY BEFORE BREAKFAST   LOSARTAN (COZAAR) 100 MG TABLET    Take 1 tablet (100 mg total) by mouth daily.   MEMANTINE (NAMENDA XR) 14 MG CP24 24 HR CAPSULE    TAKE 1 CAPSULE DAILY FOR MEMORY   METOPROLOL TARTRATE (LOPRESSOR) 50 MG TABLET    Take 1 tablet (50 mg total) by mouth 2 (two) times daily.   MIRTAZAPINE (REMERON) 15 MG TABLET    TAKE 1 TABLET ONCE DAILY DUE TO WEIGHT LOSS TO INCREASE APPETITE AND ANXIETY, DEPRESSION   MULTIPLE VITAMIN (MULTIVITAMIN WITH MINERALS) TABS TABLET    Take 2 tablets by mouth daily.    VITAMIN D, ERGOCALCIFEROL, (DRISDOL) 50000 UNITS CAPS CAPSULE  Take 1 capsule (50,000 Units total) by mouth every 7 (seven) days.  Modified Medications   No medications on file  Discontinued Medications   No medications on file     Physical Exam:  Vitals:   09/01/17 1443  BP: (!) 142/76  Pulse: 70  Temp: 98.4 F (36.9 C)  TempSrc: Oral  SpO2: 96%  Weight: 136 lb (61.7 kg)  Height: 5\' 6"  (1.676 m)   Body mass index is 21.95 kg/m.  Physical Exam  Constitutional: She is oriented to person, place, and time. She appears well-developed and well-nourished. No distress.  HENT:  Head: Normocephalic and atraumatic.  Neck: Normal range of motion. Neck supple.  Cardiovascular: Normal rate, regular rhythm and normal heart sounds.  No murmur heard. Pulmonary/Chest: Effort normal and breath sounds normal. No respiratory distress. She has no  wheezes.  Abdominal: Soft. Bowel sounds are normal. She exhibits no distension. There is no tenderness.  Musculoskeletal: She exhibits edema (2+ bilaterally).  Neurological: She is alert and oriented to person, place, and time.  Skin: Skin is warm and dry.  Psychiatric: She has a normal mood and affect.    Labs reviewed: Basic Metabolic Panel: Recent Labs    10/21/16 1438 01/23/17 1028 05/19/17 1600  NA 144 144 145  K 3.7 3.8 4.4  CL 109 107 105  CO2 24 29 32  GLUCOSE 81 92 96  BUN 20 18 23   CREATININE 1.18* 1.22* 1.54*  CALCIUM 7.7* 8.1* 8.4*  TSH  --   --  1.14   Liver Function Tests: Recent Labs    10/21/16 1438 05/19/17 1600  AST 21 21  ALT 16 13  ALKPHOS 86  --   BILITOT 0.5 0.7  PROT 6.4 6.6  ALBUMIN 3.8  --    No results for input(s): LIPASE, AMYLASE in the last 8760 hours. No results for input(s): AMMONIA in the last 8760 hours. CBC: Recent Labs    10/21/16 1438 05/19/17 1600  WBC 6.8 7.8  NEUTROABS 4,760 5,444  HGB 13.5 14.0  HCT 40.3 41.7  MCV 94.6 92.3  PLT 174 165   Lipid Panel: Recent Labs    01/23/17 1028  CHOL 173  HDL 46*  LDLCALC 104*  TRIG 132  CHOLHDL 3.8   TSH: Recent Labs    05/19/17 1600  TSH 1.14   A1C: Lab Results  Component Value Date   HGBA1C (H) 11/23/2009    5.7 (NOTE)                                                                       According to the ADA Clinical Practice Recommendations for 2011, when HbA1c is used as a screening test:   >=6.5%   Diagnostic of Diabetes Mellitus           (if abnormal result  is confirmed)  5.7-6.4%   Increased risk of developing Diabetes Mellitus  References:Diagnosis and Classification of Diabetes Mellitus,Diabetes FOYD,7412,87(OMVEH 1):S62-S69 and Standards of Medical Care in         Diabetes - 2011,Diabetes Care,2011,34  (Suppl 1):S11-S61.     Assessment/Plan 1. Essential hypertension -blood pressure has improved but LE edema from norvasc has worsen. Will stop norvasc  at this time and start  hydralazine 25 mg BID. To continue on losartan and metoprolol at this time.  - hydrALAZINE (APRESOLINE) 25 MG tablet; Take 1 tablet (25 mg total) by mouth 2 (two) times daily.  Dispense: 180 tablet; Refill: 1 - metoprolol tartrate (LOPRESSOR) 50 MG tablet; Take 1 tablet (50 mg total) by mouth 2 (two) times daily.  Dispense: 180 tablet; Refill: 1 -goal blood pressure <140/90, to take bp three times weekly. To call or follow up in 2 weeks for follow up.   2. Vitamin D deficiency Continues on vit D 50,000 units, recent labs by nephrologist.   3. Vascular dementia without behavioral disturbance Progressive disease noted, without acute changes. Daughter helping with compliance for medication. Continues on namenda.   4. Loss of weight Stable, weight up today which could be due to LE but overall eating better.   5. CKD (chronic kidney disease) stage 4, GFR 15-29 ml/min (HCC) Stable, nephrology no longer request follow up, will continue current reigimen  6. Anxiety -stable on current regimen, continues on bupropion  Next appt: 12/11/2017, to call with blood pressure readings.  Carlos American. [place], [facility] [phone]

## 2017-09-14 ENCOUNTER — Other Ambulatory Visit: Payer: Self-pay | Admitting: Nurse Practitioner

## 2017-09-14 DIAGNOSIS — F329 Major depressive disorder, single episode, unspecified: Secondary | ICD-10-CM

## 2017-09-14 DIAGNOSIS — F32A Depression, unspecified: Secondary | ICD-10-CM

## 2017-09-14 DIAGNOSIS — F419 Anxiety disorder, unspecified: Principal | ICD-10-CM

## 2017-09-25 ENCOUNTER — Other Ambulatory Visit: Payer: Self-pay | Admitting: Nurse Practitioner

## 2017-09-25 DIAGNOSIS — I10 Essential (primary) hypertension: Secondary | ICD-10-CM

## 2017-10-20 ENCOUNTER — Telehealth: Payer: Self-pay | Admitting: *Deleted

## 2017-10-20 MED ORDER — ALENDRONATE SODIUM 70 MG PO TABS: ORAL_TABLET | ORAL | 0 refills | Status: DC

## 2017-10-20 NOTE — Telephone Encounter (Signed)
[name], daughter called and stated that Lauren [name] recommended Fosamax back in January and after much thought and research daughter is wanting the Rx called into the pharmacy. Daughter is wanting to try it for 30 days and see how her mother does with it. Is it ok to add to medication list and send to pharmacy. Please Advise.

## 2017-10-20 NOTE — Telephone Encounter (Signed)
Medication list updated and Rx faxed to pharmacy.  

## 2017-10-20 NOTE — Telephone Encounter (Signed)
Yes, please send fosamax 70mg  po weekly for a one month supply as requested.

## 2017-10-27 ENCOUNTER — Ambulatory Visit (INDEPENDENT_AMBULATORY_CARE_PROVIDER_SITE_OTHER): Payer: Medicare HMO

## 2017-10-27 ENCOUNTER — Ambulatory Visit: Payer: Medicare HMO | Admitting: Podiatry

## 2017-10-27 ENCOUNTER — Other Ambulatory Visit: Payer: Self-pay | Admitting: Podiatry

## 2017-10-27 ENCOUNTER — Encounter: Payer: Self-pay | Admitting: Podiatry

## 2017-10-27 VITALS — BP 176/85 | HR 70

## 2017-10-27 DIAGNOSIS — M79672 Pain in left foot: Principal | ICD-10-CM

## 2017-10-27 DIAGNOSIS — L84 Corns and callosities: Secondary | ICD-10-CM | POA: Diagnosis not present

## 2017-10-27 DIAGNOSIS — M722 Plantar fascial fibromatosis: Secondary | ICD-10-CM | POA: Diagnosis not present

## 2017-10-27 DIAGNOSIS — M79671 Pain in right foot: Secondary | ICD-10-CM

## 2017-10-27 DIAGNOSIS — M2042 Other hammer toe(s) (acquired), left foot: Secondary | ICD-10-CM | POA: Diagnosis not present

## 2017-10-27 DIAGNOSIS — M2041 Other hammer toe(s) (acquired), right foot: Secondary | ICD-10-CM | POA: Diagnosis not present

## 2017-10-29 NOTE — Progress Notes (Signed)
Subjective:   Patient ID: Lauren [name], female   DOB: 84 y.o.   MRN: [id number]   HPI Patient presents stating that she has developed pain in the bottom of both feet which seems to resolve recently and she has a digital deformity second digit right with a corn formation that is been moderately painful.  Patient is not a good historian and does have caregiver and her caregiver states the pain had been present quite a bit recently.  Patient does not smoke likes to be active if possible   Review of Systems  All other systems reviewed and are negative.       Objective:  Physical Exam  Constitutional: She appears well-developed and well-nourished.  Cardiovascular: Intact distal pulses.  Pulmonary/Chest: Effort normal.  Musculoskeletal: Normal range of motion.  Neurological: She is alert.  Skin: Skin is warm.  Nursing note and vitals reviewed.   Neurovascular status was found to be adequate with mild edema in the ankle region bilateral +1 with diminished range of motion of the subtalar midtarsal joint.  Patient has very mild discomfort plantar aspect heel region and she does not complain of this when palpated and has elevated elongated second digit right with contracture of the MPJ and keratotic lesion on the medial side of the toe.  Patient has good digital perfusion     Assessment:  Mild plantar fasciitis with digital deformity second right with keratotic lesion medial side of the second toe     Plan:  H&P condition reviewed and discussed with her and caregiver.  I do not recommend treatment for the plantar irritation unless it worsens and I advised on supportive shoes and mild stretching exercises.  I did go ahead today and I did sterile debridement of the lesion second digit right and applied padding to the toe to take pressure off this and this can be done as needed

## 2017-11-18 ENCOUNTER — Other Ambulatory Visit: Payer: Self-pay | Admitting: Nurse Practitioner

## 2017-11-18 ENCOUNTER — Other Ambulatory Visit: Payer: Self-pay | Admitting: Internal Medicine

## 2017-11-18 DIAGNOSIS — E7800 Pure hypercholesterolemia, unspecified: Secondary | ICD-10-CM

## 2017-12-11 ENCOUNTER — Ambulatory Visit (INDEPENDENT_AMBULATORY_CARE_PROVIDER_SITE_OTHER): Payer: Medicare HMO | Admitting: Internal Medicine

## 2017-12-11 ENCOUNTER — Encounter: Payer: Self-pay | Admitting: Internal Medicine

## 2017-12-11 VITALS — BP 138/70 | HR 65 | Temp 98.2°F | Ht 66.0 in | Wt 132.0 lb

## 2017-12-11 DIAGNOSIS — I10 Essential (primary) hypertension: Secondary | ICD-10-CM | POA: Diagnosis not present

## 2017-12-11 DIAGNOSIS — M8000XS Age-related osteoporosis with current pathological fracture, unspecified site, sequela: Secondary | ICD-10-CM | POA: Diagnosis not present

## 2017-12-11 DIAGNOSIS — Z23 Encounter for immunization: Secondary | ICD-10-CM | POA: Diagnosis not present

## 2017-12-11 DIAGNOSIS — F0150 Vascular dementia without behavioral disturbance: Secondary | ICD-10-CM

## 2017-12-11 DIAGNOSIS — N184 Chronic kidney disease, stage 4 (severe): Secondary | ICD-10-CM | POA: Diagnosis not present

## 2017-12-11 DIAGNOSIS — E559 Vitamin D deficiency, unspecified: Secondary | ICD-10-CM | POA: Diagnosis not present

## 2017-12-11 DIAGNOSIS — R69 Illness, unspecified: Secondary | ICD-10-CM | POA: Diagnosis not present

## 2017-12-11 LAB — BASIC METABOLIC PANEL
BUN/Creatinine Ratio: 14 (calc) (ref 6–22)
BUN: 23 mg/dL (ref 7–25)
CO2: 32 mmol/L (ref 20–32)
Calcium: 8.7 mg/dL (ref 8.6–10.4)
Chloride: 107 mmol/L (ref 98–110)
Creat: 1.63 mg/dL — ABNORMAL HIGH (ref 0.60–0.88)
Glucose, Bld: 92 mg/dL (ref 65–139)
Potassium: 4.2 mmol/L (ref 3.5–5.3)
Sodium: 146 mmol/L (ref 135–146)

## 2017-12-11 MED ORDER — ALENDRONATE SODIUM 70 MG PO TABS: ORAL_TABLET | ORAL | 1 refills | Status: DC

## 2017-12-11 MED ORDER — METOPROLOL TARTRATE 50 MG PO TABS: 50.0000 mg | ORAL_TABLET | Freq: Two times a day (BID) | ORAL | 1 refills | Status: DC

## 2017-12-11 NOTE — Progress Notes (Signed)
Location:  [facility] clinic Provider:  Leven Hoel L. Mariea Clonts, D.O., C.M.D.  Code Status: DNR based on her living will wishes Goals of Care:  Advanced Directives 09/01/2017  Does Patient Have a Medical Advance Directive? Yes  Type of Paramedic of [place];Living will  Does patient want to make changes to medical advance directive? -  Copy of [place] in Chart? Yes  Would patient like information on creating a medical advance directive? -     Chief Complaint  Patient presents with  . Medical Management of Chronic Issues    35mth follow-up    HPI: Patient is a 84 y.o. female seen today for medical management of chronic diseases.    Reports feeling fine.   Edema improved with a change in bp medication.  Feet were hurting She's been to podiatry. Dr. Paulla Dolly saw her.  xrays were fine and by then, her feet didn't hurt anymore.  No problem since.    Did great to get a couple of teeth pulled a couple weeks ago.  Dr. Loyal Gambler.  She didn't feel pain before or after.  She'd fought it.  A crown had come off.  He was worried about an abscess.    No pain complaints.  Sleeps well--hits the sack and doesn't wake until morning.  Appetite is good.  Bowels are moving well.  Wears a pad for some leakage occasionally.  Had one incontinence episode after her dental work.  Has some chronic rhinorrhea, but not bothersome.  Sees well with her glasses--checkup last year was good. Cataracts were there but not bad enough to do surgery.  Hearing well.    No falls.  She says she's been lucky.  Kasandra Knudsen keeps her steady.    BP good here today.    Pt reports her memory is pretty good.  Does forget at times.  Lives at [facility] [place].    Osteoporosis:  Her daughter didn't really want her to take fosamax due to side effects.  She's still on vitamin D weekly.  She did not have side effects.  Also takes ca with D chewables.  Stopped the fosamax after a month.    Past  Medical History:  Diagnosis Date  . Anxiety   . High cholesterol   . Hypertension   . Osteoporosis   . Rosacea   . Thyroid disease   . Weight loss     Past Surgical History:  Procedure Laterality Date  . CHOLECYSTECTOMY  1959   [place]  . THYRO[place]ECTOMY  2003   [place]  . TONSILLECTOMY AND ADENOIDECTOMY      Allergies  Allergen Reactions  . Aricept [Donepezil Hcl]   . Aspirin     Upset stomach     Outpatient Encounter Medications as of 12/11/2017  Medication Sig  . alendronate (FOSAMAX) 70 MG tablet TAKE 1 TABLET BY MOUTH ONCE WKLY W/GLASS OF WATER ON EMPTY STOMACH DONT LAY DOWN 30 MIN AFTER TAKING  . atorvastatin (LIPITOR) 10 MG tablet TAKE ONE-HALF (1/2) TABLET DAILY  . buPROPion (WELLBUTRIN XL) 150 MG 24 hr tablet TAKE 1 TABLET DAILY IN THE MORNING FOR ANXIETY  . Calcium Carbonate-Vitamin D (CALTRATE 600+D) 600-400 MG-UNIT tablet Take 1 tablet by mouth 2 (two) times daily.  . hydrALAZINE (APRESOLINE) 25 MG tablet Take 1 tablet (25 mg total) by mouth 2 (two) times daily.  Marland Kitchen levothyroxine (SYNTHROID, LEVOTHROID) 100 MCG tablet TAKE 1 TABLET DAILY BEFORE BREAKFAST  . losartan (COZAAR) 100 MG tablet  Take 1 tablet (100 mg total) by mouth daily.  . memantine (NAMENDA XR) 14 MG CP24 24 hr capsule TAKE 1 CAPSULE DAILY FOR MEMORY  . metoprolol tartrate (LOPRESSOR) 50 MG tablet Take 1 tablet (50 mg total) by mouth 2 (two) times daily.  . mirtazapine (REMERON) 15 MG tablet TAKE 1 TABLET ONCE DAILY DUE TO WEIGHT LOSS TO INCREASE APPETITE AND ANXIETY, DEPRESSION  . Multiple Vitamin (MULTIVITAMIN WITH MINERALS) TABS tablet Take 2 tablets by mouth daily.   . Vitamin D, Ergocalciferol, (DRISDOL) 50000 units CAPS capsule Take 1 capsule (50,000 Units total) by mouth every 7 (seven) days.   No facility-administered encounter medications on file as of 12/11/2017.     Review of Systems:  Review of Systems  Constitutional: Negative for chills and fever.  HENT: Negative for congestion and  hearing loss.        Chronic rhinitis  Eyes: Negative for blurred vision.       Glasses, cataracts  Respiratory: Negative for cough and shortness of breath.   Cardiovascular: Positive for leg swelling. Negative for chest pain and palpitations.       Mild edema by end of day  Gastrointestinal: Negative for abdominal pain, blood in stool, constipation, diarrhea, heartburn, melena, nausea and vomiting.  Genitourinary: Positive for urgency. Negative for dysuria and frequency.  Musculoskeletal: Negative for falls, joint pain and myalgias.       H/o pelvic fracture  Skin: Negative for itching and rash.  Neurological: Negative for dizziness and loss of consciousness.  Endo/Heme/Allergies: Does not bruise/bleed easily.  Psychiatric/Behavioral: Positive for memory loss. Negative for depression. The patient is not nervous/anxious and does not have insomnia.     Health Maintenance  Topic Date Due  . INFLUENZA VACCINE  10/16/2017  . TETANUS/TDAP  02/01/2025  . DEXA SCAN  Completed  . PNA vac Low Risk Adult  Completed    Physical Exam: Vitals:   12/11/17 1430  BP: 138/70  Pulse: 65  Temp: 98.2 F (36.8 C)  TempSrc: Oral  SpO2: 96%  Weight: 132 lb (59.9 kg)  Height: 5\' 6"  (1.676 m)   Body mass index is 21.31 kg/m. Physical Exam  Constitutional: She appears well-developed and well-nourished. No distress.  HENT:  Head: Normocephalic and atraumatic.  Cardiovascular: Normal rate, regular rhythm, normal heart sounds and intact distal pulses.  Pulmonary/Chest: Effort normal and breath sounds normal. No respiratory distress.  Abdominal: Bowel sounds are normal.  Musculoskeletal: Normal range of motion. She exhibits no tenderness.  Neurological: She is alert.  Oriented to person, place; some short term memory loss during history  Skin: Skin is warm and dry. Capillary refill takes less than 2 seconds.  Psychiatric: She has a normal mood and affect.    Labs reviewed: Basic Metabolic  Panel: Recent Labs    01/23/17 1028 05/19/17 1600  NA 144 145  K 3.8 4.4  CL 107 105  CO2 29 32  GLUCOSE 92 96  BUN 18 23  CREATININE 1.22* 1.54*  CALCIUM 8.1* 8.4*  TSH  --  1.14   Liver Function Tests: Recent Labs    05/19/17 1600  AST 21  ALT 13  BILITOT 0.7  PROT 6.6   No results for input(s): LIPASE, AMYLASE in the last 8760 hours. No results for input(s): AMMONIA in the last 8760 hours. CBC: Recent Labs    05/19/17 1600  WBC 7.8  NEUTROABS 5,444  HGB 14.0  HCT 41.7  MCV 92.3  PLT 165   Lipid  Panel: Recent Labs    01/23/17 1028  CHOL 173  HDL 46*  LDLCALC 104*  TRIG 132  CHOLHDL 3.8   Lab Results  Component Value Date   HGBA1C (H) 11/23/2009    5.7 (NOTE)                                                                       According to the ADA Clinical Practice Recommendations for 2011, when HbA1c is used as a screening test:   >=6.5%   Diagnostic of Diabetes Mellitus           (if abnormal result  is confirmed)  5.7-6.4%   Increased risk of developing Diabetes Mellitus  References:Diagnosis and Classification of Diabetes Mellitus,Diabetes PXTG,6269,48(NIOEV 1):S62-S69 and Standards of Medical Care in         Diabetes - 2011,Diabetes OJJK,0938,18  (Suppl 1):S11-S61.    Assessment/Plan 1. Vascular dementia without behavioral disturbance (HCC) -cont namenda XR and family support and [place]  2. Vitamin D deficiency -cont vitamin D and ca with D  3. Osteoporosis with current pathological fracture, unspecified osteoporosis type, sequela -had prior pelvic fx  - cont ca with D and D supplements, restart fosamax--pt's daughter unsure about this with fear of side effects but pt did tolerate first month well - alendronate (FOSAMAX) 70 MG tablet; TAKE 1 TABLET BY MOUTH ONCE WKLY W/GLASS OF WATER ON EMPTY STOMACH DONT LAY DOWN 30 MIN AFTER TAKING  Dispense: 12 tablet; Refill: 1  4. CKD (chronic kidney disease) stage 4, GFR 15-29 ml/min  (HCC) - Avoid nephrotoxic agents like nsaids, dose adjust renally excreted meds, hydrate. - Basic metabolic panel -cont losartan therapy  5. Essential hypertension -at goal, cont current bp medications--[facility] - metoprolol tartrate (LOPRESSOR) 50 MG tablet; Take 1 tablet (50 mg total) by mouth 2 (two) times daily.  Dispense: 180 tablet; Refill: 1  6. Need for influenza vaccination -flu shot given  Labs/tests ordered:   Orders Placed This Encounter  Procedures  . Flu vaccine HIGH DOSE PF (Fluzone High dose)  . Basic metabolic panel    Order Specific Question:   Has the patient fasted?    Answer:   Yes   Next appt:  6 mos for CPE with Jessica   Niala Stcharles L. Gloriann Riede, D.O. [facility] [address], [place] [address] Cell Phone (Mon-Fri 8am-5pm):  [phone] On Call:  [phone] & follow prompts after 5pm & weekends Office Phone:  [phone] Office Fax:  [phone]

## 2017-12-12 ENCOUNTER — Encounter: Payer: Self-pay | Admitting: *Deleted

## 2018-01-27 ENCOUNTER — Other Ambulatory Visit: Payer: Self-pay | Admitting: *Deleted

## 2018-01-27 DIAGNOSIS — I10 Essential (primary) hypertension: Secondary | ICD-10-CM

## 2018-01-27 MED ORDER — HYDRALAZINE HCL 25 MG PO TABS: 25.0000 mg | ORAL_TABLET | Freq: Two times a day (BID) | ORAL | 1 refills | Status: DC

## 2018-01-27 NOTE — Telephone Encounter (Signed)
[facility]

## 2018-02-09 ENCOUNTER — Other Ambulatory Visit: Payer: Self-pay | Admitting: Nurse Practitioner

## 2018-02-09 DIAGNOSIS — R413 Other amnesia: Secondary | ICD-10-CM

## 2018-02-16 ENCOUNTER — Ambulatory Visit: Payer: Medicare HMO | Admitting: Internal Medicine

## 2018-02-16 ENCOUNTER — Encounter: Payer: Self-pay | Admitting: Nurse Practitioner

## 2018-02-16 ENCOUNTER — Ambulatory Visit (INDEPENDENT_AMBULATORY_CARE_PROVIDER_SITE_OTHER): Payer: Medicare HMO | Admitting: Nurse Practitioner

## 2018-02-16 VITALS — BP 152/90 | HR 61 | Temp 98.3°F | Ht 66.0 in | Wt 135.0 lb

## 2018-02-16 DIAGNOSIS — E559 Vitamin D deficiency, unspecified: Secondary | ICD-10-CM | POA: Diagnosis not present

## 2018-02-16 DIAGNOSIS — F0150 Vascular dementia without behavioral disturbance: Secondary | ICD-10-CM

## 2018-02-16 DIAGNOSIS — M8000XS Age-related osteoporosis with current pathological fracture, unspecified site, sequela: Secondary | ICD-10-CM | POA: Diagnosis not present

## 2018-02-16 DIAGNOSIS — F419 Anxiety disorder, unspecified: Secondary | ICD-10-CM

## 2018-02-16 DIAGNOSIS — F32A Depression, unspecified: Secondary | ICD-10-CM

## 2018-02-16 DIAGNOSIS — I10 Essential (primary) hypertension: Secondary | ICD-10-CM | POA: Diagnosis not present

## 2018-02-16 DIAGNOSIS — N184 Chronic kidney disease, stage 4 (severe): Secondary | ICD-10-CM

## 2018-02-16 DIAGNOSIS — E785 Hyperlipidemia, unspecified: Secondary | ICD-10-CM | POA: Diagnosis not present

## 2018-02-16 DIAGNOSIS — F329 Major depressive disorder, single episode, unspecified: Secondary | ICD-10-CM

## 2018-02-16 DIAGNOSIS — E039 Hypothyroidism, unspecified: Secondary | ICD-10-CM | POA: Diagnosis not present

## 2018-02-16 DIAGNOSIS — R69 Illness, unspecified: Secondary | ICD-10-CM | POA: Diagnosis not present

## 2018-02-16 NOTE — Patient Instructions (Addendum)
To see about someone coming in to help with medications to help with compliance   Reschedule fasting lab appt for later this week or next

## 2018-02-16 NOTE — Progress Notes (Signed)
Careteam: Patient Care Team: [name], NP as PCP - General (Nurse Practitioner)  Advanced Directive information    Allergies  Allergen Reactions  . Aricept [Donepezil Hcl]   . Aspirin     Upset stomach     Chief Complaint  Patient presents with  . Medical Management of Chronic Issues    Acute visit to discuss medication, daughter stated patient is not taking medication as directed and it is effecting her memory and mood. Here with daughter [name]     HPI: Patient is a 84 y.o. female seen in the office today at the request of her daughter.  Daughter states she is not taking her medication for about a month.  Says she prepares her medication but at the end of the week all her pills are still there. Pt gets very defensive when her daughter ask about them.  Daughter has tried to offer if have someone come in to help her take her medication but she does not want help.   Has not taken any medication today- blood pressure is high due to this.   Anxiety/depression- has been an issue now that she is not taking medication consistently, taking Wellbutrin daily and remeron  Weight loss- stable, taking Remeron occasionally   Hyperlipidemia- taking Lipitor  Review of Systems:  Review of Systems  Unable to perform ROS: Dementia    Past Medical History:  Diagnosis Date  . Anxiety   . High cholesterol   . Hypertension   . Osteoporosis   . Rosacea   . Thyroid disease   . Weight loss    Past Surgical History:  Procedure Laterality Date  . CHOLECYSTECTOMY  1959   [place]  . THYROIDECTOMY  2003   [place]  . TONSILLECTOMY AND ADENOIDECTOMY     Social History:   reports that she has never smoked. She has never used smokeless tobacco. She reports that she does not drink alcohol or use drugs.  Family History  Problem Relation Age of Onset  . Hypertension Mother   . Stroke Mother   . Stroke Father   . Lung cancer Daughter     Medications: Patient's  Medications  New Prescriptions   No medications on file  Previous Medications   ALENDRONATE (FOSAMAX) 70 MG TABLET    TAKE 1 TABLET BY MOUTH ONCE WKLY W/GLASS OF WATER ON EMPTY STOMACH DONT LAY DOWN 30 MIN AFTER TAKING   ATORVASTATIN (LIPITOR) 10 MG TABLET    TAKE ONE-HALF (1/2) TABLET DAILY   BUPROPION (WELLBUTRIN XL) 150 MG 24 HR TABLET    TAKE 1 TABLET DAILY IN THE MORNING FOR ANXIETY   CALCIUM CARBONATE-VITAMIN D (CALTRATE 600+D) 600-400 MG-UNIT TABLET    Take 1 tablet by mouth 2 (two) times daily.   HYDRALAZINE (APRESOLINE) 25 MG TABLET    Take 1 tablet (25 mg total) by mouth 2 (two) times daily.   LEVOTHYROXINE (SYNTHROID, LEVOTHROID) 100 MCG TABLET    TAKE 1 TABLET DAILY BEFORE BREAKFAST   LOSARTAN (COZAAR) 100 MG TABLET    Take 1 tablet (100 mg total) by mouth daily.   MEMANTINE (NAMENDA XR) 14 MG CP24 24 HR CAPSULE    TAKE 1 CAPSULE DAILY FOR MEMORY   METOPROLOL TARTRATE (LOPRESSOR) 50 MG TABLET    Take 1 tablet (50 mg total) by mouth 2 (two) times daily.   MIRTAZAPINE (REMERON) 15 MG TABLET    TAKE 1 TABLET ONCE DAILY DUE TO WEIGHT LOSS TO INCREASE APPETITE AND ANXIETY,  DEPRESSION   MULTIPLE VITAMIN (MULTIVITAMIN WITH MINERALS) TABS TABLET    Take 2 tablets by mouth daily.    VITAMIN D, ERGOCALCIFEROL, (DRISDOL) 50000 UNITS CAPS CAPSULE    Take 1 capsule (50,000 Units total) by mouth every 7 (seven) days.  Modified Medications   No medications on file  Discontinued Medications   No medications on file     Physical Exam:  Vitals:   02/16/18 1457  BP: (!) 152/90  Pulse: 61  Temp: 98.3 F (36.8 C)  TempSrc: Oral  SpO2: 97%  Weight: 135 lb (61.2 kg)  Height: 5\' 6"  (1.676 m)   Body mass index is 21.79 kg/m.  Physical Exam  Constitutional: She appears well-developed and well-nourished. No distress.  HENT:  Head: Normocephalic and atraumatic.  Cardiovascular: Normal rate, regular rhythm, normal heart sounds and intact distal pulses.  Pulmonary/Chest: Effort normal and  breath sounds normal. No respiratory distress.  Abdominal: Bowel sounds are normal.  Musculoskeletal: Normal range of motion. She exhibits no tenderness.  Neurological: She is alert.  Oriented to person, place Daughter provides most of the history  Skin: Skin is warm and dry. Capillary refill takes less than 2 seconds.  Psychiatric: She has a normal mood and affect.    Labs reviewed: Basic Metabolic Panel: Recent Labs    05/19/17 1600 12/11/17 1534  NA 145 146  K 4.4 4.2  CL 105 107  CO2 32 32  GLUCOSE 96 92  BUN 23 23  CREATININE 1.54* 1.63*  CALCIUM 8.4* 8.7  TSH 1.14  --    Liver Function Tests: Recent Labs    05/19/17 1600  AST 21  ALT 13  BILITOT 0.7  PROT 6.6   No results for input(s): LIPASE, AMYLASE in the last 8760 hours. No results for input(s): AMMONIA in the last 8760 hours. CBC: Recent Labs    05/19/17 1600  WBC 7.8  NEUTROABS 5,444  HGB 14.0  HCT 41.7  MCV 92.3  PLT 165   Lipid Panel: No results for input(s): CHOL, HDL, LDLCALC, TRIG, CHOLHDL, LDLDIRECT in the last 8760 hours. TSH: Recent Labs    05/19/17 1600  TSH 1.14   A1C: Lab Results  Component Value Date   HGBA1C (H) 11/23/2009    5.7 (NOTE)                                                                       According to the ADA Clinical Practice Recommendations for 2011, when HbA1c is used as a screening test:   >=6.5%   Diagnostic of Diabetes Mellitus           (if abnormal result  is confirmed)  5.7-6.4%   Increased risk of developing Diabetes Mellitus  References:Diagnosis and Classification of Diabetes Mellitus,Diabetes QQPY,1950,93(OIZTI 1):S62-S69 and Standards of Medical Care in         Diabetes - 2011,Diabetes Care,2011,34  (Suppl 1):S11-S61.     Assessment/Plan 1. Vascular dementia without behavioral disturbance ([place]) Pt not remembering medication and then getting upset with daughter when she reminds her. Has not consistently been taking namenda and memory is  worsening. Discussed options like minimizing medication which pt does not wish to do or having someone come in to help give her  pills which she was more agreeable for at the time of her OV.   2. Hyperlipidemia LDL goal <100 -discussed stopping lipitor, will follow up lipids at this time and evaluate.  - Lipid Panel; Future - COMPLETE METABOLIC PANEL WITH GFR; Future  3. Essential hypertension Elevated today but has not taken medication.  - CBC with Differential/Platelets; Future  4. Vitamin D deficiency To continue 50,000 weekly  5. Osteoporosis with current pathological fracture, unspecified osteoporosis type, sequela On fosamax but not taken regularly, to continue this with calcium and vit d  6. CKD (chronic kidney disease) stage 4, GFR 15-29 ml/min (HCC) Encourage proper hydration and to avoid NSAIDS (Aleve, Advil, Motrin, Ibuprofen)   7. Anxiety and depression Worsening anxiety and depression but not taking medications. Will continue current regimen and daughter planning on getting someone to help with compliance.   8. Acquired hypothyroidism -not consistently taking thyroid medication, synthroid 100 mcg daily  - TSH; Future  Next appt: to schedule lab appt for fasting labs. Keep upcoming appt as scheduled.  Carlos American. [place], [facility] [phone]

## 2018-02-17 ENCOUNTER — Other Ambulatory Visit: Payer: Medicare HMO

## 2018-02-19 ENCOUNTER — Other Ambulatory Visit: Payer: Medicare HMO

## 2018-02-19 DIAGNOSIS — E039 Hypothyroidism, unspecified: Secondary | ICD-10-CM | POA: Diagnosis not present

## 2018-02-19 DIAGNOSIS — E785 Hyperlipidemia, unspecified: Secondary | ICD-10-CM | POA: Diagnosis not present

## 2018-02-19 DIAGNOSIS — I10 Essential (primary) hypertension: Secondary | ICD-10-CM

## 2018-02-19 LAB — LIPID PANEL
CHOL/HDL RATIO: 4.5 (calc) (ref <5.0)
Cholesterol: 188 mg/dL (ref <200)
HDL: 42 mg/dL — AB (ref >50)
LDL Cholesterol (Calc): 121 mg/dL (calc) — ABNORMAL HIGH
NON-HDL CHOLESTEROL (CALC): 146 mg/dL — AB (ref <130)
Triglycerides: 139 mg/dL (ref <150)

## 2018-02-19 LAB — CBC WITH DIFFERENTIAL/PLATELET
Basophils Absolute: 22 cells/uL (ref 0–200)
Basophils Relative: 0.3 %
Eosinophils Absolute: 72 cells/uL (ref 15–500)
Eosinophils Relative: 1 %
HEMATOCRIT: 42.5 % (ref 35.0–45.0)
Hemoglobin: 14.4 g/dL (ref 11.7–15.5)
Lymphs Abs: 929 cells/uL (ref 850–3900)
MCH: 31.6 pg (ref 27.0–33.0)
MCHC: 33.9 g/dL (ref 32.0–36.0)
MCV: 93.4 fL (ref 80.0–100.0)
MPV: 11.7 fL (ref 7.5–12.5)
Monocytes Relative: 8 %
Neutro Abs: 5602 cells/uL (ref 1500–7800)
Neutrophils Relative %: 77.8 %
Platelets: 133 10*3/uL — ABNORMAL LOW (ref 140–400)
RBC: 4.55 10*6/uL (ref 3.80–5.10)
RDW: 12.9 % (ref 11.0–15.0)
Total Lymphocyte: 12.9 %
WBC mixed population: 576 cells/uL (ref 200–950)
WBC: 7.2 10*3/uL (ref 3.8–10.8)

## 2018-02-19 LAB — COMPLETE METABOLIC PANEL WITH GFR
AG Ratio: 1.5 (calc) (ref 1.0–2.5)
ALT: 16 U/L (ref 6–29)
AST: 22 U/L (ref 10–35)
Albumin: 4.2 g/dL (ref 3.6–5.1)
Alkaline phosphatase (APISO): 106 U/L (ref 33–130)
BUN/Creatinine Ratio: 14 (calc) (ref 6–22)
BUN: 21 mg/dL (ref 7–25)
CO2: 28 mmol/L (ref 20–32)
CREATININE: 1.55 mg/dL — AB (ref 0.60–0.88)
Calcium: 8.4 mg/dL — ABNORMAL LOW (ref 8.6–10.4)
Chloride: 105 mmol/L (ref 98–110)
GFR, EST NON AFRICAN AMERICAN: 29 mL/min/{1.73_m2} — AB (ref >=60)
GFR, Est African American: 34 mL/min/{1.73_m2} — ABNORMAL LOW (ref >=60)
Globulin: 2.8 g/dL (calc) (ref 1.9–3.7)
Glucose, Bld: 99 mg/dL (ref 65–99)
Potassium: 4.3 mmol/L (ref 3.5–5.3)
SODIUM: 144 mmol/L (ref 135–146)
Total Bilirubin: 1 mg/dL (ref 0.2–1.2)
Total Protein: 7 g/dL (ref 6.1–8.1)

## 2018-02-19 LAB — TSH: TSH: 0.14 mIU/L — ABNORMAL LOW (ref 0.40–4.50)

## 2018-02-23 ENCOUNTER — Telehealth: Payer: Self-pay

## 2018-02-23 DIAGNOSIS — E039 Hypothyroidism, unspecified: Secondary | ICD-10-CM

## 2018-02-23 MED ORDER — LEVOTHYROXINE SODIUM 75 MCG PO TABS: 75.0000 ug | ORAL_TABLET | Freq: Every day | ORAL | 3 refills | Status: DC

## 2018-02-23 NOTE — Telephone Encounter (Signed)
Prescription was sent to the pharmacy and repeat tsh was ordered. Appointment was scheduled.

## 2018-02-23 NOTE — Telephone Encounter (Signed)
-----   Message from [name], NP sent at 02/20/2018  2:16 PM EST ----- TSH is now low, this was not expected since she has not been taking her medication regularly, lets reduce her synthroid to 75 mcg and make sure she is taking daily and follow up TSH in 4 weeks. Platelets slightly lower than normal but will monitor this. Cholesterol has worsened, expect this is due to not taking her medication correctly as well. Otherwise labs consistent with previous

## 2018-03-02 ENCOUNTER — Telehealth: Payer: Self-pay | Admitting: *Deleted

## 2018-03-02 DIAGNOSIS — F0150 Vascular dementia without behavioral disturbance: Secondary | ICD-10-CM

## 2018-03-02 NOTE — Telephone Encounter (Signed)
Lauren [name], daughter called and stated that she called the insurance company and they will cover for patient to have [place] through [facility]. Stated that she would like a referral placed for patient to receive Home Health to help with Medication management. Daughter stated that this was discussed at last appointment. Please Advise.

## 2018-03-03 NOTE — Telephone Encounter (Signed)
This is fine, we can refer her to home health for medication management

## 2018-03-03 NOTE — Telephone Encounter (Signed)
Home Health Referral Placed.

## 2018-03-06 ENCOUNTER — Other Ambulatory Visit: Payer: Self-pay | Admitting: Nurse Practitioner

## 2018-03-06 DIAGNOSIS — F32A Depression, unspecified: Secondary | ICD-10-CM

## 2018-03-06 DIAGNOSIS — F329 Major depressive disorder, single episode, unspecified: Secondary | ICD-10-CM

## 2018-03-06 DIAGNOSIS — F419 Anxiety disorder, unspecified: Principal | ICD-10-CM

## 2018-03-06 MED ORDER — LEVOTHYROXINE SODIUM 75 MCG PO TABS: 75.0000 ug | ORAL_TABLET | Freq: Every day | ORAL | 0 refills | Status: DC

## 2018-03-06 NOTE — Telephone Encounter (Signed)
Patient daughter requested refill.  

## 2018-03-20 ENCOUNTER — Other Ambulatory Visit: Payer: Medicare HMO

## 2018-03-20 DIAGNOSIS — E039 Hypothyroidism, unspecified: Secondary | ICD-10-CM | POA: Diagnosis not present

## 2018-03-21 LAB — TSH: TSH: 0.15 mIU/L — ABNORMAL LOW (ref 0.40–4.50)

## 2018-03-23 ENCOUNTER — Other Ambulatory Visit: Payer: Self-pay | Admitting: Nurse Practitioner

## 2018-03-23 MED ORDER — LEVOTHYROXINE SODIUM 50 MCG PO TABS: 50.0000 ug | ORAL_TABLET | Freq: Every day | ORAL | 1 refills | Status: DC

## 2018-03-24 ENCOUNTER — Other Ambulatory Visit: Payer: Self-pay

## 2018-03-24 DIAGNOSIS — E039 Hypothyroidism, unspecified: Secondary | ICD-10-CM

## 2018-04-03 ENCOUNTER — Other Ambulatory Visit: Payer: Self-pay | Admitting: Nurse Practitioner

## 2018-04-03 DIAGNOSIS — F419 Anxiety disorder, unspecified: Secondary | ICD-10-CM

## 2018-04-03 DIAGNOSIS — F329 Major depressive disorder, single episode, unspecified: Secondary | ICD-10-CM

## 2018-04-03 DIAGNOSIS — F32A Depression, unspecified: Secondary | ICD-10-CM

## 2018-04-03 DIAGNOSIS — R634 Abnormal weight loss: Secondary | ICD-10-CM

## 2018-05-05 ENCOUNTER — Other Ambulatory Visit: Payer: Medicare HMO

## 2018-05-05 DIAGNOSIS — E039 Hypothyroidism, unspecified: Secondary | ICD-10-CM | POA: Diagnosis not present

## 2018-05-05 LAB — TSH: TSH: 21.82 m[IU]/L — AB (ref 0.40–4.50)

## 2018-05-06 ENCOUNTER — Telehealth: Payer: Self-pay

## 2018-05-06 ENCOUNTER — Other Ambulatory Visit: Payer: Self-pay

## 2018-05-06 DIAGNOSIS — R7989 Other specified abnormal findings of blood chemistry: Secondary | ICD-10-CM

## 2018-05-06 MED ORDER — LEVOTHYROXINE SODIUM 75 MCG PO TABS: 75.0000 ug | ORAL_TABLET | Freq: Every day | ORAL | 0 refills | Status: DC

## 2018-05-06 NOTE — Telephone Encounter (Signed)
Provider recommended patient schedule annual wellness visit and stated that patient could have annual wellness and follow up appointment with Janett Billow and labs all in the same day. Tried calling back to schedule appointments with patient's daughter. Left message for Daughter to call office. Please refer to result note for labs dated 05/05/2018.

## 2018-05-08 ENCOUNTER — Telehealth: Payer: Self-pay | Admitting: Nurse Practitioner

## 2018-05-08 NOTE — Telephone Encounter (Signed)
Called pts daughter [name]) to move appt time in April, at which I confirmed that [name] had been approved for her Prolia inj & no one had returned 2 messages to schedule.  [name] has decided not to have the Prolia injection for [name] & will speak with Janett Billow about it in April.  Thanks, Vilinda Blanks.

## 2018-05-15 ENCOUNTER — Telehealth: Payer: Self-pay | Admitting: *Deleted

## 2018-05-15 NOTE — Telephone Encounter (Signed)
Let have her come in for an office visit to be evaluated and discuss.

## 2018-05-15 NOTE — Telephone Encounter (Signed)
Patient daughter, [name] called and stated that since changing patient's thyroid medication patient is starting to have issues. Stated that she is acting disoriented, Depressed, and told her neighbor she wanted to die, and agitated.No other symptoms noted. Patient has been taking the increased dose for about a week. Please Advise.

## 2018-05-15 NOTE — Telephone Encounter (Signed)
Appointment scheduled with Dr. Mariea Clonts for Monday to be evaluated.

## 2018-05-18 ENCOUNTER — Ambulatory Visit: Payer: Self-pay | Admitting: Internal Medicine

## 2018-05-18 DIAGNOSIS — R69 Illness, unspecified: Secondary | ICD-10-CM | POA: Diagnosis not present

## 2018-05-19 DIAGNOSIS — R69 Illness, unspecified: Secondary | ICD-10-CM | POA: Diagnosis not present

## 2018-05-20 DIAGNOSIS — R69 Illness, unspecified: Secondary | ICD-10-CM | POA: Diagnosis not present

## 2018-05-21 DIAGNOSIS — R69 Illness, unspecified: Secondary | ICD-10-CM | POA: Diagnosis not present

## 2018-05-22 ENCOUNTER — Encounter: Payer: Self-pay | Admitting: Family

## 2018-05-22 ENCOUNTER — Ambulatory Visit: Payer: Self-pay

## 2018-05-22 DIAGNOSIS — R69 Illness, unspecified: Secondary | ICD-10-CM | POA: Diagnosis not present

## 2018-05-23 DIAGNOSIS — R69 Illness, unspecified: Secondary | ICD-10-CM | POA: Diagnosis not present

## 2018-05-24 DIAGNOSIS — R69 Illness, unspecified: Secondary | ICD-10-CM | POA: Diagnosis not present

## 2018-05-25 DIAGNOSIS — R69 Illness, unspecified: Secondary | ICD-10-CM | POA: Diagnosis not present

## 2018-05-26 DIAGNOSIS — R69 Illness, unspecified: Secondary | ICD-10-CM | POA: Diagnosis not present

## 2018-05-27 DIAGNOSIS — R69 Illness, unspecified: Secondary | ICD-10-CM | POA: Diagnosis not present

## 2018-05-28 DIAGNOSIS — R69 Illness, unspecified: Secondary | ICD-10-CM | POA: Diagnosis not present

## 2018-05-29 DIAGNOSIS — R69 Illness, unspecified: Secondary | ICD-10-CM | POA: Diagnosis not present

## 2018-05-30 ENCOUNTER — Other Ambulatory Visit: Payer: Self-pay | Admitting: Nurse Practitioner

## 2018-05-30 DIAGNOSIS — R69 Illness, unspecified: Secondary | ICD-10-CM | POA: Diagnosis not present

## 2018-05-30 DIAGNOSIS — E559 Vitamin D deficiency, unspecified: Secondary | ICD-10-CM

## 2018-05-31 DIAGNOSIS — R69 Illness, unspecified: Secondary | ICD-10-CM | POA: Diagnosis not present

## 2018-06-01 DIAGNOSIS — R69 Illness, unspecified: Secondary | ICD-10-CM | POA: Diagnosis not present

## 2018-06-01 NOTE — Telephone Encounter (Signed)
Last Vit D level was 04/16/2016  Please advise

## 2018-06-02 DIAGNOSIS — R69 Illness, unspecified: Secondary | ICD-10-CM | POA: Diagnosis not present

## 2018-06-03 DIAGNOSIS — R69 Illness, unspecified: Secondary | ICD-10-CM | POA: Diagnosis not present

## 2018-06-04 DIAGNOSIS — R69 Illness, unspecified: Secondary | ICD-10-CM | POA: Diagnosis not present

## 2018-06-05 DIAGNOSIS — R69 Illness, unspecified: Secondary | ICD-10-CM | POA: Diagnosis not present

## 2018-06-05 NOTE — Telephone Encounter (Signed)
Opened in error; Disregard.

## 2018-06-06 DIAGNOSIS — R69 Illness, unspecified: Secondary | ICD-10-CM | POA: Diagnosis not present

## 2018-06-07 DIAGNOSIS — R69 Illness, unspecified: Secondary | ICD-10-CM | POA: Diagnosis not present

## 2018-06-08 ENCOUNTER — Other Ambulatory Visit: Payer: Self-pay | Admitting: Internal Medicine

## 2018-06-08 DIAGNOSIS — R69 Illness, unspecified: Secondary | ICD-10-CM | POA: Diagnosis not present

## 2018-06-08 DIAGNOSIS — M8000XS Age-related osteoporosis with current pathological fracture, unspecified site, sequela: Secondary | ICD-10-CM

## 2018-06-09 DIAGNOSIS — R69 Illness, unspecified: Secondary | ICD-10-CM | POA: Diagnosis not present

## 2018-06-10 DIAGNOSIS — R69 Illness, unspecified: Secondary | ICD-10-CM | POA: Diagnosis not present

## 2018-06-11 DIAGNOSIS — R69 Illness, unspecified: Secondary | ICD-10-CM | POA: Diagnosis not present

## 2018-06-12 DIAGNOSIS — R69 Illness, unspecified: Secondary | ICD-10-CM | POA: Diagnosis not present

## 2018-06-13 DIAGNOSIS — R69 Illness, unspecified: Secondary | ICD-10-CM | POA: Diagnosis not present

## 2018-06-14 DIAGNOSIS — R69 Illness, unspecified: Secondary | ICD-10-CM | POA: Diagnosis not present

## 2018-06-18 ENCOUNTER — Encounter: Payer: Medicare HMO | Admitting: Nurse Practitioner

## 2018-06-25 ENCOUNTER — Other Ambulatory Visit: Payer: Self-pay | Admitting: Nurse Practitioner

## 2018-07-01 ENCOUNTER — Ambulatory Visit: Payer: Self-pay | Admitting: Nurse Practitioner

## 2018-07-01 ENCOUNTER — Other Ambulatory Visit: Payer: Self-pay

## 2018-07-12 ENCOUNTER — Encounter: Payer: Self-pay | Admitting: Nurse Practitioner

## 2018-07-17 ENCOUNTER — Encounter: Payer: Self-pay | Admitting: Nurse Practitioner

## 2018-07-17 DIAGNOSIS — R413 Other amnesia: Secondary | ICD-10-CM

## 2018-07-17 DIAGNOSIS — I10 Essential (primary) hypertension: Secondary | ICD-10-CM

## 2018-07-17 NOTE — Telephone Encounter (Signed)
This encounter was created in error - please disregard.

## 2018-08-11 ENCOUNTER — Other Ambulatory Visit: Payer: Self-pay

## 2018-08-11 ENCOUNTER — Encounter: Payer: Self-pay | Admitting: Nurse Practitioner

## 2018-08-11 ENCOUNTER — Ambulatory Visit (INDEPENDENT_AMBULATORY_CARE_PROVIDER_SITE_OTHER): Payer: Medicare HMO | Admitting: Nurse Practitioner

## 2018-08-11 VITALS — BP 126/80 | HR 60 | Temp 98.9°F | Ht 66.0 in | Wt 138.0 lb

## 2018-08-11 DIAGNOSIS — F419 Anxiety disorder, unspecified: Secondary | ICD-10-CM

## 2018-08-11 DIAGNOSIS — F329 Major depressive disorder, single episode, unspecified: Secondary | ICD-10-CM

## 2018-08-11 DIAGNOSIS — N184 Chronic kidney disease, stage 4 (severe): Secondary | ICD-10-CM | POA: Diagnosis not present

## 2018-08-11 DIAGNOSIS — E039 Hypothyroidism, unspecified: Secondary | ICD-10-CM

## 2018-08-11 DIAGNOSIS — I10 Essential (primary) hypertension: Secondary | ICD-10-CM | POA: Diagnosis not present

## 2018-08-11 DIAGNOSIS — R634 Abnormal weight loss: Secondary | ICD-10-CM

## 2018-08-11 DIAGNOSIS — Z0000 Encounter for general adult medical examination without abnormal findings: Secondary | ICD-10-CM

## 2018-08-11 DIAGNOSIS — F0150 Vascular dementia without behavioral disturbance: Secondary | ICD-10-CM | POA: Diagnosis not present

## 2018-08-11 DIAGNOSIS — E785 Hyperlipidemia, unspecified: Secondary | ICD-10-CM

## 2018-08-11 DIAGNOSIS — R69 Illness, unspecified: Secondary | ICD-10-CM | POA: Diagnosis not present

## 2018-08-11 DIAGNOSIS — F32A Depression, unspecified: Secondary | ICD-10-CM

## 2018-08-11 NOTE — Patient Instructions (Signed)
Lauren [name] , Thank you for taking time to come for your Medicare Wellness Visit. I appreciate your ongoing commitment to your health goals. Please review the following plan we discussed and let me know if I can assist you in the future.   Screening recommendations/referrals: Colonoscopy aged out Mammogram aged out Bone Density declines Recommended yearly ophthalmology/optometry visit for glaucoma screening and checkup Recommended yearly dental visit for hygiene and checkup  Vaccinations: Influenza vaccine 10/2018 Pneumococcal vaccine up to date Tdap vaccine up to date Shingles vaccine up to date    Advanced directives: on file.   Conditions/risks identified: worsening memory loss, decline in physical and mental status.   Next appointment: 1 year.    Preventive Care 45 Years and Older, Female Preventive care refers to lifestyle choices and visits with your health care provider that can promote health and wellness. What does preventive care include?  A yearly physical exam. This is also called an annual well check.  Dental exams once or twice a year.  Routine eye exams. Ask your health care provider how often you should have your eyes checked.  Personal lifestyle choices, including:  Daily care of your teeth and gums.  Regular physical activity.  Eating a healthy diet.  Avoiding tobacco and drug use.  Limiting alcohol use.  Practicing safe sex.  Taking low-dose aspirin every day.  Taking vitamin and mineral supplements as recommended by your health care provider. What happens during an annual well check? The services and screenings done by your health care provider during your annual well check will depend on your age, overall health, lifestyle risk factors, and family history of disease. Counseling  Your health care provider may ask you questions about your:  Alcohol use.  Tobacco use.  Drug use.  Emotional well-being.  Home and relationship well-being.   Sexual activity.  Eating habits.  History of falls.  Memory and ability to understand (cognition).  Work and work Statistician.  Reproductive health. Screening  You may have the following tests or measurements:  Height, weight, and BMI.  Blood pressure.  Lipid and cholesterol levels. These may be checked every 5 years, or more frequently if you are over 44 years old.  Skin check.  Lung cancer screening. You may have this screening every year starting at age 83 if you have a 30-pack-year history of smoking and currently smoke or have quit within the past 15 years.  Fecal occult blood test (FOBT) of the stool. You may have this test every year starting at age 83.  Flexible sigmoidoscopy or colonoscopy. You may have a sigmoidoscopy every 5 years or a colonoscopy every 10 years starting at age 83.  Hepatitis C blood test.  Hepatitis B blood test.  Sexually transmitted disease (STD) testing.  Diabetes screening. This is done by checking your blood sugar (glucose) after you have not eaten for a while (fasting). You may have this done every 1-3 years.  Bone density scan. This is done to screen for osteoporosis. You may have this done starting at age 66.  Mammogram. This may be done every 1-2 years. Talk to your health care provider about how often you should have regular mammograms. Talk with your health care provider about your test results, treatment options, and if necessary, the need for more tests. Vaccines  Your health care provider may recommend certain vaccines, such as:  Influenza vaccine. This is recommended every year.  Tetanus, diphtheria, and acellular pertussis (Tdap, Td) vaccine. You may need a Td  booster every 10 years.  Zoster vaccine. You may need this after age 48.  Pneumococcal 13-valent conjugate (PCV13) vaccine. One dose is recommended after age 72.  Pneumococcal polysaccharide (PPSV23) vaccine. One dose is recommended after age 21. Talk to your  health care provider about which screenings and vaccines you need and how often you need them. This information is not intended to replace advice given to you by your health care provider. Make sure you discuss any questions you have with your health care provider. Document Released: 03/31/2015 Document Revised: 11/22/2015 Document Reviewed: 01/03/2015 Elsevier Interactive Patient Education  2017 [place] Prevention in the Home Falls can cause injuries. They can happen to people of all ages. There are many things you can do to make your home safe and to help prevent falls. What can I do on the outside of my home?  Regularly fix the edges of walkways and driveways and fix any cracks.  Remove anything that might make you trip as you walk through a door, such as a raised step or threshold.  Trim any bushes or trees on the path to your home.  Use bright outdoor lighting.  Clear any walking paths of anything that might make someone trip, such as rocks or tools.  Regularly check to see if handrails are loose or broken. Make sure that both sides of any steps have handrails.  Any raised decks and porches should have guardrails on the edges.  Have any leaves, snow, or ice cleared regularly.  Use sand or salt on walking paths during winter.  Clean up any spills in your garage right away. This includes oil or grease spills. What can I do in the bathroom?  Use night lights.  Install grab bars by the toilet and in the tub and shower. Do not use towel bars as grab bars.  Use non-skid mats or decals in the tub or shower.  If you need to sit down in the shower, use a plastic, non-slip stool.  Keep the floor dry. Clean up any water that spills on the floor as soon as it happens.  Remove soap buildup in the tub or shower regularly.  Attach bath mats securely with double-sided non-slip rug tape.  Do not have throw rugs and other things on the floor that can make you trip. What  can I do in the bedroom?  Use night lights.  Make sure that you have a light by your bed that is easy to reach.  Do not use any sheets or blankets that are too big for your bed. They should not hang down onto the floor.  Have a firm chair that has side arms. You can use this for support while you get dressed.  Do not have throw rugs and other things on the floor that can make you trip. What can I do in the kitchen?  Clean up any spills right away.  Avoid walking on wet floors.  Keep items that you use a lot in easy-to-reach places.  If you need to reach something above you, use a strong step stool that has a grab bar.  Keep electrical cords out of the way.  Do not use floor polish or wax that makes floors slippery. If you must use wax, use non-skid floor wax.  Do not have throw rugs and other things on the floor that can make you trip. What can I do with my stairs?  Do not leave any items on the stairs.  Make sure that there are handrails on both sides of the stairs and use them. Fix handrails that are broken or loose. Make sure that handrails are as long as the stairways.  Check any carpeting to make sure that it is firmly attached to the stairs. Fix any carpet that is loose or worn.  Avoid having throw rugs at the top or bottom of the stairs. If you do have throw rugs, attach them to the floor with carpet tape.  Make sure that you have a light switch at the top of the stairs and the bottom of the stairs. If you do not have them, ask someone to add them for you. What else can I do to help prevent falls?  Wear shoes that:  Do not have high heels.  Have rubber bottoms.  Are comfortable and fit you well.  Are closed at the toe. Do not wear sandals.  If you use a stepladder:  Make sure that it is fully opened. Do not climb a closed stepladder.  Make sure that both sides of the stepladder are locked into place.  Ask someone to hold it for you, if possible.   Clearly mark and make sure that you can see:  Any grab bars or handrails.  First and last steps.  Where the edge of each step is.  Use tools that help you move around (mobility aids) if they are needed. These include:  Canes.  Walkers.  Scooters.  Crutches.  Turn on the lights when you go into a dark area. Replace any light bulbs as soon as they burn out.  Set up your furniture so you have a clear path. Avoid moving your furniture around.  If any of your floors are uneven, fix them.  If there are any pets around you, be aware of where they are.  Review your medicines with your doctor. Some medicines can make you feel dizzy. This can increase your chance of falling. Ask your doctor what other things that you can do to help prevent falls. This information is not intended to replace advice given to you by your health care provider. Make sure you discuss any questions you have with your health care provider. Document Released: 12/29/2008 Document Revised: 08/10/2015 Document Reviewed: 04/08/2014 Elsevier Interactive Patient Education  2017 [employer].

## 2018-08-11 NOTE — Progress Notes (Signed)
Careteam: Patient Care Team: [name], NP as PCP - General (Nurse Practitioner)  Advanced Directive information    Allergies  Allergen Reactions  . Aricept [Donepezil Hcl]   . Aspirin     Upset stomach     Chief Complaint  Patient presents with  . Medical Management of Chronic Issues    6 month follow up and recheck TSH  . Medication Management    Patient never filled rx for Fosamax due to the side effects   . Medication Refill    [facility]      HPI: Patient is a 85 y.o. female seen in the office today for routine follow up  Hypothyroid- TSH most recently at 21.85, her synthroid was increased and now taking synthroid to 75 mcg. Daughter reports she is doing much better at this time.  Prior her TSH was low at 0.15 but at that time her daughter did not feel like she was consistently taking medication. Now has a service to come in and make sure she is taking her medication.   Osteopenia- does not wish for her to be on medication. Aware of risk for fracture but due to side effects does not want her taking.   Weight loss- weight has been stable. Daughter unsure what her eating is like due to COVID-19 she is not able to visit.   Hyperlipidemia- continues on Lipitor.   Hypertension- had an episode of really high blood pressure but was doing fine and no other symptoms.  Called a nurse through the insurance and the only thing she had was being more disoriented so she pushed fluids and blood pressure has been better since.  demenita- MMSE 13/30 today. Pt unaware she even takes medication. States she does not take any pills but pill services is giving her medication. She lives in independent living. Still bathing and dressing herself. daughter has not been able to visit so unsure how things have been going in the last few months but feels like things are good. Weight is stable.  Review of Systems:  Review of Systems  Unable to perform ROS:  Dementia    Past Medical History:  Diagnosis Date  . Anxiety   . High cholesterol   . Hypertension   . Osteoporosis   . Rosacea   . Thyroid disease   . Weight loss    Past Surgical History:  Procedure Laterality Date  . CHOLECYSTECTOMY  1959   [place]  . THYROIDECTOMY  2003   [place]  . TONSILLECTOMY AND ADENOIDECTOMY     Social History:   reports that she has never smoked. She has never used smokeless tobacco. She reports that she does not drink alcohol or use drugs.  Family History  Problem Relation Age of Onset  . Hypertension Mother   . Stroke Mother   . Stroke Father   . Lung cancer Daughter     Medications: Patient's Medications  New Prescriptions   No medications on file  Previous Medications   ATORVASTATIN (LIPITOR) 10 MG TABLET    TAKE ONE-HALF (1/2) TABLET DAILY   BUPROPION (WELLBUTRIN XL) 150 MG 24 HR TABLET    TAKE 1 TABLET DAILY IN THE MORNING FOR ANXIETY   HYDRALAZINE (APRESOLINE) 25 MG TABLET    Take 1 tablet (25 mg total) by mouth 2 (two) times daily.   LEVOTHYROXINE (SYNTHROID, LEVOTHROID) 75 MCG TABLET    TAKE 1 TABLET (75 MCG TOTAL) BY MOUTH DAILY BEFORE BREAKFAST.  LOSARTAN (COZAAR) 100 MG TABLET    TAKE 1 TABLET DAILY   MEMANTINE (NAMENDA XR) 14 MG CP24 24 HR CAPSULE    TAKE 1 CAPSULE DAILY FOR MEMORY   METOPROLOL TARTRATE (LOPRESSOR) 50 MG TABLET    Take 1 tablet (50 mg total) by mouth 2 (two) times daily.   MIRTAZAPINE (REMERON) 15 MG TABLET    TAKE 1 TABLET ONCE DAILY DUE TO WEIGHT LOSS TO INCREASE APPETITE AND ANXIETY, DEPRESSION   MULTIPLE VITAMIN (MULTIVITAMIN WITH MINERALS) TABS TABLET    Take 2 tablets by mouth daily.    VITAMIN D, ERGOCALCIFEROL, (DRISDOL) 1.25 MG (50000 UT) CAPS CAPSULE    TAKE 1 CAPSULE EVERY 7 DAYS  Modified Medications   No medications on file  Discontinued Medications   No medications on file     Physical Exam:  Vitals:   08/11/18 1352  BP: 126/80  Pulse: 60  Temp: 98.9 F (37.2 C)  TempSrc: Oral   SpO2: 97%  Weight: 138 lb (62.6 kg)  Height: 5\' 6"  (1.676 m)   Body mass index is 22.27 kg/m.  Physical Exam Constitutional:      General: She is not in acute distress.    Appearance: She is well-developed.  HENT:     Head: Normocephalic and atraumatic.  Cardiovascular:     Rate and Rhythm: Normal rate and regular rhythm.     Heart sounds: Normal heart sounds.  Pulmonary:     Effort: Pulmonary effort is normal. No respiratory distress.     Breath sounds: Normal breath sounds.  Abdominal:     General: Bowel sounds are normal.  Musculoskeletal: Normal range of motion.        General: No tenderness.     Right lower leg: Edema (1+) present.     Left lower leg: Edema (1+) present.  Skin:    General: Skin is warm and dry.     Capillary Refill: Capillary refill takes less than 2 seconds.  Neurological:     Mental Status: She is alert.     Comments: Oriented to person, place Daughter provides the history     Labs reviewed: Basic Metabolic Panel: Recent Labs    12/11/17 1534 02/19/18 1019 03/20/18 1007 05/05/18 1126  NA 146 144  --   --   K 4.2 4.3  --   --   CL 107 105  --   --   CO2 32 28  --   --   GLUCOSE 92 99  --   --   BUN 23 21  --   --   CREATININE 1.63* 1.55*  --   --   CALCIUM 8.7 8.4*  --   --   TSH  --  0.14* 0.15* 21.82*   Liver Function Tests: Recent Labs    02/19/18 1019  AST 22  ALT 16  BILITOT 1.0  PROT 7.0   No results for input(s): LIPASE, AMYLASE in the last 8760 hours. No results for input(s): AMMONIA in the last 8760 hours. CBC: Recent Labs    02/19/18 1019  WBC 7.2  NEUTROABS 5,602  HGB 14.4  HCT 42.5  MCV 93.4  PLT 133*   Lipid Panel: Recent Labs    02/19/18 1019  CHOL 188  HDL 42*  LDLCALC 121*  TRIG 139  CHOLHDL 4.5   TSH: Recent Labs    02/19/18 1019 03/20/18 1007 05/05/18 1126  TSH 0.14* 0.15* 21.82*   A1C: Lab Results  Component Value Date  HGBA1C (H) 11/23/2009    5.7 (NOTE)                                                                        According to the ADA Clinical Practice Recommendations for 2011, when HbA1c is used as a screening test:   >=6.5%   Diagnostic of Diabetes Mellitus           (if abnormal result  is confirmed)  5.7-6.4%   Increased risk of developing Diabetes Mellitus  References:Diagnosis and Classification of Diabetes Mellitus,Diabetes VUYE,3343,56(YSHUO 1):S62-S69 and Standards of Medical Care in         Diabetes - 2011,Diabetes HFGB,0211,15  (Suppl 1):S11-S61.     Assessment/Plan 1. Vascular dementia without behavioral disturbance (HCC) Stable, appears to be doing well in independent living with medication reminder service  2. Hypocalcemia encouraged to continue supplement. Will follow up lab today  3. Hypothyroidism, unspecified type -currently on Levothroid 54mcg - TSH  4. Essential hypertension -stable on losartan, metoprolol and hydralazine - COMPLETE METABOLIC PANEL WITH GFR - CBC with Differential/Platelet  5. Loss of weight Continues on remeron, weight has been stable.   6. CKD (chronic kidney disease) stage 4, GFR 15-29 ml/min (HCC) Encourage proper hydration and to avoid NSAIDS (Aleve, Advil, Motrin, Ibuprofen)   7. Hyperlipidemia LDL goal <100 Continues on lipitor 10 mg by mouth daily  - Lipid Panel  8. Anxiety and depression Stable, maintained on Wellbutrin and remeron.    Next appt: 4 months.  Carlos American. [place], [facility] [phone]

## 2018-08-11 NOTE — Progress Notes (Signed)
Subjective:   Lauren [name] is a 85 y.o. female who presents for Medicare Annual (Subsequent) preventive examination.  Review of Systems:   Cardiac Risk Factors include: advanced age (>9men, >30 women);dyslipidemia;hypertension;sedentary lifestyle     Objective:     Vitals: BP 126/80 (BP Location: Right Arm, Patient Position: Sitting, Cuff Size: Normal)   Pulse 60   Temp 98.9 F (37.2 C) (Oral)   Ht 5\' 6"  (1.676 m)   Wt 138 lb (62.6 kg)   SpO2 97%   BMI 22.27 kg/m   Body mass index is 22.27 kg/m.  Advanced Directives 09/01/2017 08/12/2017 05/19/2017 01/23/2017 12/25/2016 10/21/2016 11/28/2015  Does Patient Have a Medical Advance Directive? Yes Yes Yes Yes Yes Yes Yes  Type of Paramedic of [place];Living will Living will Healthcare Power of [place];Living will Living will;Healthcare Power of [place] will  Does patient want to make changes to medical advance directive? - - No - Patient declined No - Patient declined - - -  Copy of [place] in Chart? Yes - Yes - Yes - Yes  Would patient like information on creating a medical advance directive? - - - - - - -    Tobacco Social History   Tobacco Use  Smoking Status Never Smoker  Smokeless Tobacco Never Used     Counseling given: Not Answered   Clinical Intake:  Pre-visit preparation completed: Yes  Pain : No/denies pain     BMI - recorded: 22.27 Nutritional Status: BMI of 19-24  Normal Nutritional Risks: None Diabetes: No  How often do you need to have someone help you when you read instructions, pamphlets, or other written materials from your doctor or pharmacy?: 5 - Always What is the last grade level you completed in school?: 12th grade  Interpreter Needed?: No     Past Medical History:  Diagnosis Date  . Anxiety   . High cholesterol   . Hypertension   . Osteoporosis   . Rosacea   . Thyroid  disease   . Weight loss    Past Surgical History:  Procedure Laterality Date  . CHOLECYSTECTOMY  1959   [place]  . THYROIDECTOMY  2003   [place]  . TONSILLECTOMY AND ADENOIDECTOMY     Family History  Problem Relation Age of Onset  . Hypertension Mother   . Stroke Mother   . Stroke Father   . Lung cancer Daughter    Social History   Socioeconomic History  . Marital status: Widowed    Spouse name: Not on file  . Number of children: Not on file  . Years of education: Not on file  . Highest education level: Not on file  Occupational History  . Not on file  Social Needs  . Financial resource strain: Not hard at all  . Food insecurity:    Worry: Never true    Inability: Never true  . Transportation needs:    Medical: No    Non-medical: No  Tobacco Use  . Smoking status: Never Smoker  . Smokeless tobacco: Never Used  Substance and Sexual Activity  . Alcohol use: No    Alcohol/week: 0.0 standard drinks  . Drug use: No  . Sexual activity: Not Currently  Lifestyle  . Physical activity:    Days per week: 0 days    Minutes per session: 0 min  . Stress: Not at all  Relationships  . Social connections:  Talks on phone: Three times a week    Gets together: Twice a week    Attends religious service: More than 4 times per year    Active member of club or organization: No    Attends meetings of clubs or organizations: Never    Relationship status: Widowed  Other Topics Concern  . Not on file  Social History Narrative   As of 03/24/2014:   Diet-good   Widow   Lives in [place], 1 stories, 1 person, no pets   Past/current profession- receptionist   No exercise     Outpatient Encounter Medications as of 08/11/2018  Medication Sig  . atorvastatin (LIPITOR) 10 MG tablet TAKE ONE-HALF (1/2) TABLET DAILY  . buPROPion (WELLBUTRIN XL) 150 MG 24 hr tablet TAKE 1 TABLET DAILY IN THE MORNING FOR ANXIETY  . hydrALAZINE (APRESOLINE) 25 MG tablet Take 1 tablet (25 mg total) by  mouth 2 (two) times daily.  Marland Kitchen levothyroxine (SYNTHROID, LEVOTHROID) 75 MCG tablet TAKE 1 TABLET (75 MCG TOTAL) BY MOUTH DAILY BEFORE BREAKFAST.  Marland Kitchen losartan (COZAAR) 100 MG tablet TAKE 1 TABLET DAILY  . memantine (NAMENDA XR) 14 MG CP24 24 hr capsule TAKE 1 CAPSULE DAILY FOR MEMORY  . metoprolol tartrate (LOPRESSOR) 50 MG tablet Take 1 tablet (50 mg total) by mouth 2 (two) times daily.  . mirtazapine (REMERON) 15 MG tablet TAKE 1 TABLET ONCE DAILY DUE TO WEIGHT LOSS TO INCREASE APPETITE AND ANXIETY, DEPRESSION  . Multiple Vitamin (MULTIVITAMIN WITH MINERALS) TABS tablet Take 2 tablets by mouth daily.   . Vitamin D, Ergocalciferol, (DRISDOL) 1.25 MG (50000 UT) CAPS capsule TAKE 1 CAPSULE EVERY 7 DAYS  . [DISCONTINUED] alendronate (FOSAMAX) 70 MG tablet TAKE 1 TABLET ONCE WEEKLY WITH A GLASS OF WATER ON AN EMPTY STOMACH DO NOT LIE DOWN FOR 30 MINUTES AFTER TAKING (Patient not taking: Reported on 08/11/2018)  . [DISCONTINUED] Calcium Carbonate-Vitamin D (CALTRATE 600+D) 600-400 MG-UNIT tablet Take 1 tablet by mouth 2 (two) times daily. (Patient not taking: Reported on 08/11/2018)   No facility-administered encounter medications on file as of 08/11/2018.     Activities of Daily Living In your present state of health, do you have any difficulty performing the following activities: 08/11/2018  Hearing? Y  Vision? N  Difficulty concentrating or making decisions? Y  Walking or climbing stairs? N  Dressing or bathing? N  Doing errands, shopping? Y  Preparing Food and eating ? N  Using the Toilet? N  In the past six months, have you accidently leaked urine? Y  Do you have problems with loss of bowel control? N  Managing your Medications? Y  Managing your Finances? Y  Housekeeping or managing your Housekeeping? Y  Some recent data might be hidden    Patient Care Team: Lauren Chandler, NP as PCP - General (Nurse Practitioner)    Assessment:   This is a routine wellness examination for Lauren [name].   Exercise Activities and Dietary recommendations Current Exercise Habits: The patient does not participate in regular exercise at present, Exercise limited by: psychological condition(s)  Goals    . Patient Stated     Maintain current level of health        Fall Risk Fall Risk  08/11/2018 02/16/2018 12/11/2017 09/01/2017 08/12/2017  Falls in the past year? 0 0 No No No  Number falls in past yr: 0 0 - - -  Comment - - - - -  Injury with Fall? 0 0 - - -  Comment - - - - -  Is the patient's home free of loose throw rugs in walkways, pet beds, electrical cords, etc?   yes      Grab bars in the bathroom? yes      Handrails on the stairs?   yes      Adequate lighting?   yes  Timed Get Up and Go performed: na  Depression Screen PHQ 2/9 Scores 08/11/2018 12/11/2017 05/19/2017 10/21/2016  PHQ - 2 Score 0 0 1 0     Cognitive Function MMSE - Mini Mental State Exam 08/11/2018 10/21/2016 10/21/2016 09/21/2015 03/24/2014  Orientation to time 0 2 2 3 5   Orientation to Place 1 1 1 4 3   Registration 3 3 3 3 3   Attention/ Calculation 0 0 0 5 5  Recall 0 0 0 0 2  Language- name 2 objects 2 2 2 2 2   Language- repeat 1 1 1 1 1   Language- follow 3 step command 3 2 2 2 3   Language- read & follow direction 1 1 1 1 1   Write a sentence 1 1 1 1 1   Copy design 1 1 1 1 1   Total score 13 14 14 23 27         Immunization History  Administered Date(s) Administered  . DTaP 09/30/2012  . Influenza, High Dose Seasonal PF 12/15/2016, 12/11/2017  . Influenza,inj,Quad PF,6+ Mos 11/28/2015  . Influenza-Unspecified 01/28/2012, 12/16/2013, 12/14/2014  . Pneumococcal Conjugate-13 10/05/2013  . Pneumococcal Polysaccharide-23 02/26/2010  . Pneumococcal-Unspecified 03/18/2013  . Tdap 02/02/2015  . Zoster 09/30/2012  . Zoster Recombinat (Shingrix) 12/25/2016, 04/25/2017    Qualifies for Shingles Vaccine?yes  Screening Tests Health Maintenance  Topic Date Due  . INFLUENZA VACCINE  10/17/2018  . TETANUS/TDAP   02/01/2025  . DEXA SCAN  Completed  . PNA vac Low Risk Adult  Completed    Cancer Screenings: Lung: Low Dose CT Chest recommended if Age 83-80 years, 30 pack-year currently smoking OR have quit w/in 15years. Patient does not qualify. Breast:  Up to date on Mammogram? Yes   Up to date of Bone Density/Dexa? Yes Colorectal: aged out  Additional Screenings: : Hepatitis C Screening: declines     Plan:      I have personally reviewed and noted the following in the patient's chart:   . Medical and social history . Use of alcohol, tobacco or illicit drugs  . Current medications and supplements . Functional ability and status . Nutritional status . Physical activity . Advanced directives . List of other physicians . Hospitalizations, surgeries, and ER visits in previous 12 months . Vitals . Screenings to include cognitive, depression, and falls . Referrals and appointments  In addition, I have reviewed and discussed with patient certain preventive protocols, quality metrics, and best practice recommendations. A written personalized care plan for preventive services as well as general preventive health recommendations were provided to patient.     Lauren Chandler, NP  08/11/2018

## 2018-08-12 ENCOUNTER — Other Ambulatory Visit: Payer: Self-pay | Admitting: Nurse Practitioner

## 2018-08-12 LAB — COMPLETE METABOLIC PANEL WITH GFR
AG Ratio: 1.5 (calc) (ref 1.0–2.5)
ALT: 18 U/L (ref 6–29)
AST: 22 U/L (ref 10–35)
Albumin: 4.2 g/dL (ref 3.6–5.1)
Alkaline phosphatase (APISO): 82 U/L (ref 37–153)
BUN/Creatinine Ratio: 17 (calc) (ref 6–22)
BUN: 25 mg/dL (ref 7–25)
CO2: 32 mmol/L (ref 20–32)
Calcium: 8.3 mg/dL — ABNORMAL LOW (ref 8.6–10.4)
Chloride: 107 mmol/L (ref 98–110)
Creat: 1.49 mg/dL — ABNORMAL HIGH (ref 0.60–0.88)
GFR, Est African American: 35 mL/min/{1.73_m2} — ABNORMAL LOW (ref >=60)
GFR, Est Non African American: 30 mL/min/{1.73_m2} — ABNORMAL LOW (ref >=60)
Globulin: 2.8 g/dL (calc) (ref 1.9–3.7)
Glucose, Bld: 89 mg/dL (ref 65–139)
Potassium: 4.4 mmol/L (ref 3.5–5.3)
Sodium: 145 mmol/L (ref 135–146)
Total Bilirubin: 0.7 mg/dL (ref 0.2–1.2)
Total Protein: 7 g/dL (ref 6.1–8.1)

## 2018-08-12 LAB — CBC WITH DIFFERENTIAL/PLATELET
Absolute Monocytes: 731 cells/uL (ref 200–950)
Basophils Absolute: 34 cells/uL (ref 0–200)
Basophils Relative: 0.4 %
Eosinophils Absolute: 109 cells/uL (ref 15–500)
Eosinophils Relative: 1.3 %
HCT: 42.3 % (ref 35.0–45.0)
Hemoglobin: 14 g/dL (ref 11.7–15.5)
Lymphs Abs: 1201 cells/uL (ref 850–3900)
MCH: 32.2 pg (ref 27.0–33.0)
MCHC: 33.1 g/dL (ref 32.0–36.0)
MCV: 97.2 fL (ref 80.0–100.0)
MPV: 11.5 fL (ref 7.5–12.5)
Monocytes Relative: 8.7 %
Neutro Abs: 6325 cells/uL (ref 1500–7800)
Neutrophils Relative %: 75.3 %
Platelets: 127 10*3/uL — ABNORMAL LOW (ref 140–400)
RBC: 4.35 10*6/uL (ref 3.80–5.10)
RDW: 13.1 % (ref 11.0–15.0)
Total Lymphocyte: 14.3 %
WBC: 8.4 10*3/uL (ref 3.8–10.8)

## 2018-08-12 LAB — LIPID PANEL
Cholesterol: 201 mg/dL — ABNORMAL HIGH (ref <200)
HDL: 41 mg/dL — ABNORMAL LOW (ref >=50)
LDL Cholesterol (Calc): 121 mg/dL (calc) — ABNORMAL HIGH
Non-HDL Cholesterol (Calc): 160 mg/dL (calc) — ABNORMAL HIGH (ref <130)
Total CHOL/HDL Ratio: 4.9 (calc) (ref <5.0)
Triglycerides: 240 mg/dL — ABNORMAL HIGH (ref <150)

## 2018-08-12 LAB — TSH: TSH: 5.84 mIU/L — ABNORMAL HIGH (ref 0.40–4.50)

## 2018-08-12 MED ORDER — LEVOTHYROXINE SODIUM 75 MCG PO TABS: 75.0000 ug | ORAL_TABLET | Freq: Every day | ORAL | 1 refills | Status: DC

## 2018-08-13 ENCOUNTER — Telehealth: Payer: Self-pay | Admitting: *Deleted

## 2018-08-13 NOTE — Telephone Encounter (Signed)
Daughter notified and agreed.  

## 2018-08-13 NOTE — Telephone Encounter (Signed)
Lauren [name], daughter called and stated that patient was just seen this week. Patient, today, had a episode of Confusion. Stated that she knows where she is at but stated she was so confused. Has done this a couple of times this past month. Doesn't last long. Stated that once she calms down she seems fine. No other symptoms noted.  Please Advise.

## 2018-08-13 NOTE — Telephone Encounter (Signed)
She has dementia and sometimes with this disease people will have acute confusion that is worse and then will get better. Encourage supportive care. If acute confusion persist or other symptoms noted to let us know.

## 2018-08-26 ENCOUNTER — Other Ambulatory Visit: Payer: Self-pay | Admitting: *Deleted

## 2018-08-26 ENCOUNTER — Other Ambulatory Visit: Payer: Self-pay | Admitting: Nurse Practitioner

## 2018-08-26 DIAGNOSIS — I10 Essential (primary) hypertension: Secondary | ICD-10-CM

## 2018-08-26 MED ORDER — LEVOTHYROXINE SODIUM 75 MCG PO TABS: 75.0000 ug | ORAL_TABLET | Freq: Every day | ORAL | 0 refills | Status: DC

## 2018-08-26 NOTE — Telephone Encounter (Signed)
[name], daughter called and requested Rx to be faxed to local pharmacy due to haven't received mail order yet.

## 2018-09-08 DIAGNOSIS — D229 Melanocytic nevi, unspecified: Secondary | ICD-10-CM | POA: Diagnosis not present

## 2018-09-08 DIAGNOSIS — C44319 Basal cell carcinoma of skin of other parts of face: Secondary | ICD-10-CM | POA: Diagnosis not present

## 2018-09-15 ENCOUNTER — Other Ambulatory Visit: Payer: Self-pay | Admitting: Nurse Practitioner

## 2018-09-15 DIAGNOSIS — R634 Abnormal weight loss: Secondary | ICD-10-CM

## 2018-09-15 DIAGNOSIS — F32A Depression, unspecified: Secondary | ICD-10-CM

## 2018-09-15 DIAGNOSIS — F329 Major depressive disorder, single episode, unspecified: Secondary | ICD-10-CM

## 2018-09-18 ENCOUNTER — Other Ambulatory Visit: Payer: Self-pay | Admitting: Nurse Practitioner

## 2018-09-22 ENCOUNTER — Other Ambulatory Visit: Payer: Self-pay | Admitting: Internal Medicine

## 2018-09-22 DIAGNOSIS — I10 Essential (primary) hypertension: Secondary | ICD-10-CM

## 2018-12-01 DIAGNOSIS — C801 Malignant (primary) neoplasm, unspecified: Secondary | ICD-10-CM

## 2018-12-01 HISTORY — DX: Malignant (primary) neoplasm, unspecified: C80.1

## 2018-12-08 DIAGNOSIS — C4441 Basal cell carcinoma of skin of scalp and neck: Secondary | ICD-10-CM | POA: Diagnosis not present

## 2018-12-30 ENCOUNTER — Ambulatory Visit (INDEPENDENT_AMBULATORY_CARE_PROVIDER_SITE_OTHER): Payer: Medicare HMO | Admitting: Nurse Practitioner

## 2018-12-30 ENCOUNTER — Other Ambulatory Visit: Payer: Self-pay

## 2018-12-30 ENCOUNTER — Encounter: Payer: Self-pay | Admitting: Nurse Practitioner

## 2018-12-30 VITALS — BP 160/84 | HR 70 | Temp 98.1°F | Resp 18 | Ht 66.0 in | Wt 129.2 lb

## 2018-12-30 DIAGNOSIS — F0150 Vascular dementia without behavioral disturbance: Secondary | ICD-10-CM

## 2018-12-30 DIAGNOSIS — Z23 Encounter for immunization: Secondary | ICD-10-CM

## 2018-12-30 DIAGNOSIS — M8000XS Age-related osteoporosis with current pathological fracture, unspecified site, sequela: Secondary | ICD-10-CM

## 2018-12-30 DIAGNOSIS — N184 Chronic kidney disease, stage 4 (severe): Secondary | ICD-10-CM | POA: Diagnosis not present

## 2018-12-30 DIAGNOSIS — E039 Hypothyroidism, unspecified: Secondary | ICD-10-CM | POA: Diagnosis not present

## 2018-12-30 DIAGNOSIS — R69 Illness, unspecified: Secondary | ICD-10-CM | POA: Diagnosis not present

## 2018-12-30 DIAGNOSIS — E785 Hyperlipidemia, unspecified: Secondary | ICD-10-CM | POA: Diagnosis not present

## 2018-12-30 DIAGNOSIS — I10 Essential (primary) hypertension: Secondary | ICD-10-CM | POA: Diagnosis not present

## 2018-12-30 DIAGNOSIS — R634 Abnormal weight loss: Secondary | ICD-10-CM

## 2018-12-30 NOTE — Progress Notes (Signed)
Careteam: Patient Care Team: [name], NP as PCP - General (Nurse Practitioner)  Advanced Directive information    Allergies  Allergen Reactions  . Aricept [Donepezil Hcl]   . Aspirin     Upset stomach     Chief Complaint  Patient presents with  . Medical Management of Chronic Issues    4 mo f/u     HPI: Patient is a 85 y.o. female seen in the office today for routine follow up.   Went to dermatologist due to place on forehead and it was found to be basal cell carcinoma and had mohs procedures.  Daughter allowed to see her to get her out to appt. Starting next week they will be going back to dining room.   Infection noted in her tooth but pt does not "want to deal with it" despite being recommended to have it taken out. No pain in mouth or trouble eating due to teeth- daughter trying to be proactive but states that she will wait until its a problem   Hypothyroid- medication services helping give her medication. TSH 5.84 on synthroid 75 mcg  Osteopenia- does not wish for her to be on medication. Aware of risk for fracture but due to side effects does not want her taking.   Weight loss- weight down, not eating as much. Will not take ensure. Still taking remeron. They have been bringing her food, but she will have to go to dinning room soon.   Hyperlipidemia- continues on Lipitor. Cholesterol elevated at last check. Has not fasted today.  Hypertension- labile-continues on hydralazine 25 mg daily, lopressor 50 mg daily and losartan   demenita- MMSE 13/30 on last . Over all stable per daughter, being taken care of at [facility] in independent living. she is on the list for assistive living. Still bathing and dressing herself. daughter has not been able to visit so unsure how things have been going in the last few months but feels like things are good- staff has not called with any concerns. She does not drive.    Review of Systems:  Review of Systems   Unable to perform ROS: Dementia    Past Medical History:  Diagnosis Date  . Anxiety   . Cancer ([place]) 12/01/2018   skin cancer on her head([place])  . High cholesterol   . Hypertension   . Osteoporosis   . Rosacea   . Thyroid disease   . Weight loss    Past Surgical History:  Procedure Laterality Date  . CHOLECYSTECTOMY  1959   [place]  . THYROIDECTOMY  2003   [place]  . TONSILLECTOMY AND ADENOIDECTOMY     Social History:   reports that she has never smoked. She has never used smokeless tobacco. She reports that she does not drink alcohol or use drugs.  Family History  Problem Relation Age of Onset  . Hypertension Mother   . Stroke Mother   . Stroke Father   . Lung cancer Daughter     Medications: Patient's Medications  New Prescriptions   No medications on file  Previous Medications   ATORVASTATIN (LIPITOR) 10 MG TABLET    TAKE ONE-HALF (1/2) TABLET DAILY   BUPROPION (WELLBUTRIN XL) 150 MG 24 HR TABLET    TAKE 1 TABLET DAILY IN THE MORNING FOR ANXIETY   HYDRALAZINE (APRESOLINE) 25 MG TABLET    TAKE 1 TABLET TWICE A DAY   LEVOTHYROXINE (SYNTHROID) 75 MCG TABLET    TAKE 1 TABLET BY  MOUTH DAILY BEFORE BREAKFAST   LOSARTAN (COZAAR) 100 MG TABLET    TAKE 1 TABLET DAILY   MEMANTINE (NAMENDA XR) 14 MG CP24 24 HR CAPSULE    TAKE 1 CAPSULE DAILY FOR MEMORY   METOPROLOL TARTRATE (LOPRESSOR) 50 MG TABLET    TAKE 1 TABLET TWICE A DAY   MIRTAZAPINE (REMERON) 15 MG TABLET    TAKE 1 TABLET ONCE DAILY DUE TO WEIGHT LOSS TO INCREASE APPETITE AND ANXIETY, DEPRESSION   MULTIPLE VITAMIN (MULTIVITAMIN WITH MINERALS) TABS TABLET    Take 2 tablets by mouth daily.    VITAMIN D, ERGOCALCIFEROL, (DRISDOL) 1.25 MG (50000 UT) CAPS CAPSULE    TAKE 1 CAPSULE EVERY 7 DAYS  Modified Medications   No medications on file  Discontinued Medications   No medications on file    Physical Exam:  Vitals:   12/30/18 1450  BP: (!) 160/84  Pulse: 70  Resp: 18  Temp: 98.1 F (36.7 C)  SpO2: 95%   Weight: 129 lb 3.2 oz (58.6 kg)  Height: '5\' 6"'  (1.676 m)   Body mass index is 20.85 kg/m. Wt Readings from Last 3 Encounters:  12/30/18 129 lb 3.2 oz (58.6 kg)  08/11/18 138 lb (62.6 kg)  08/11/18 138 lb (62.6 kg)    Physical Exam Constitutional:      General: She is not in acute distress.    Appearance: She is well-developed.  HENT:     Head: Normocephalic and atraumatic.  Cardiovascular:     Rate and Rhythm: Normal rate and regular rhythm.     Heart sounds: Normal heart sounds.  Pulmonary:     Effort: Pulmonary effort is normal. No respiratory distress.     Breath sounds: Normal breath sounds.  Abdominal:     General: Bowel sounds are normal.  Musculoskeletal: Normal range of motion.        General: No tenderness.     Right lower leg: Edema (trace) present.     Left lower leg: Edema (trace) present.  Skin:    General: Skin is warm and dry.     Capillary Refill: Capillary refill takes less than 2 seconds.  Neurological:     Mental Status: She is alert.     Comments: Oriented to person, place Daughter provides the history    Labs reviewed: Basic Metabolic Panel: Recent Labs    02/19/18 1019 03/20/18 1007 05/05/18 1126 08/11/18 1404  NA 144  --   --  145  K 4.3  --   --  4.4  CL 105  --   --  107  CO2 28  --   --  32  GLUCOSE 99  --   --  89  BUN 21  --   --  25  CREATININE 1.55*  --   --  1.49*  CALCIUM 8.4*  --   --  8.3*  TSH 0.14* 0.15* 21.82* 5.84*   Liver Function Tests: Recent Labs    02/19/18 1019 08/11/18 1404  AST 22 22  ALT 16 18  BILITOT 1.0 0.7  PROT 7.0 7.0   No results for input(s): LIPASE, AMYLASE in the last 8760 hours. No results for input(s): AMMONIA in the last 8760 hours. CBC: Recent Labs    02/19/18 1019 08/11/18 1404  WBC 7.2 8.4  NEUTROABS 5,602 6,325  HGB 14.4 14.0  HCT 42.5 42.3  MCV 93.4 97.2  PLT 133* 127*   Lipid Panel: Recent Labs    02/19/18 1019 08/11/18 1404  CHOL 188 201*  HDL 42* 41*  LDLCALC  121* 121*  TRIG 139 240*  CHOLHDL 4.5 4.9   TSH: Recent Labs    03/20/18 1007 05/05/18 1126 08/11/18 1404  TSH 0.15* 21.82* 5.84*   A1C: Lab Results  Component Value Date   HGBA1C (H) 11/23/2009    5.7 (NOTE)                                                                       According to the ADA Clinical Practice Recommendations for 2011, when HbA1c is used as a screening test:   >=6.5%   Diagnostic of Diabetes Mellitus           (if abnormal result  is confirmed)  5.7-6.4%   Increased risk of developing Diabetes Mellitus  References:Diagnosis and Classification of Diabetes Mellitus,Diabetes IWOE,3212,24(MGNOI 1):S62-S69 and Standards of Medical Care in         Diabetes - 2011,Diabetes Care,2011,34  (Suppl 1):S11-S61.     Assessment/Plan 1. Vascular dementia without behavioral disturbance ([place]) -progressive decline, on the list for assisted living but doing well in independent with assistance on medication administration. Continues on aricept and namenda.    2. Hypothyroidism, unspecified type -continues on synthroid, will follow up TSH - TSH  3. Osteoporosis with current pathological fracture, unspecified osteoporosis type, sequela Continues on cal and vit d  4. Loss of weight Ongoing, eating in room therefore she has cut back on amount- Plans to start eating in dining area which daughter hopes will promote her to eating more again. Continues on remeron.   5. CKD (chronic kidney disease) stage 4, GFR 15-29 ml/min (HCC) -Encourage proper hydration (needs to increase water intake) and to avoid NSAIDS (Aleve, Advil, Motrin, Ibuprofen)  - CMP with eGFR([facility]) - CBC with Differential/Platelet  6. Hyperlipidemia LDL goal <100 -continues on lipitor 10 mg daily. - CMP with eGFR([facility]) - Lipid panel  7. Essential hypertension -elevated today however she gets checked at facility and blood pressure is better controlled. Will continue current regimen and continue to follow.   - CBC with Differential/Platelet  8. Need for immunization against influenza - Flu Vaccine QUAD High Dose(Fluad)  Next appt: 5 months  Adrianna Dudas K. Rison, [facility] [phone]

## 2018-12-31 LAB — LIPID PANEL
Cholesterol: 181 mg/dL (ref <200)
HDL: 37 mg/dL — ABNORMAL LOW (ref >=50)
LDL Cholesterol (Calc): 109 mg/dL (calc) — ABNORMAL HIGH
Non-HDL Cholesterol (Calc): 144 mg/dL (calc) — ABNORMAL HIGH (ref <130)
Total CHOL/HDL Ratio: 4.9 (calc) (ref <5.0)
Triglycerides: 228 mg/dL — ABNORMAL HIGH (ref <150)

## 2018-12-31 LAB — TSH: TSH: 4.56 mIU/L — ABNORMAL HIGH (ref 0.40–4.50)

## 2018-12-31 LAB — COMPLETE METABOLIC PANEL WITH GFR
AG Ratio: 1.9 (calc) (ref 1.0–2.5)
ALT: 10 U/L (ref 6–29)
AST: 17 U/L (ref 10–35)
Albumin: 4.1 g/dL (ref 3.6–5.1)
Alkaline phosphatase (APISO): 79 U/L (ref 37–153)
BUN/Creatinine Ratio: 14 (calc) (ref 6–22)
BUN: 22 mg/dL (ref 7–25)
CO2: 29 mmol/L (ref 20–32)
Calcium: 8.2 mg/dL — ABNORMAL LOW (ref 8.6–10.4)
Chloride: 107 mmol/L (ref 98–110)
Creat: 1.52 mg/dL — ABNORMAL HIGH (ref 0.60–0.88)
GFR, Est African American: 34 mL/min/{1.73_m2} — ABNORMAL LOW (ref >=60)
GFR, Est Non African American: 29 mL/min/{1.73_m2} — ABNORMAL LOW (ref >=60)
Globulin: 2.2 g/dL (calc) (ref 1.9–3.7)
Glucose, Bld: 112 mg/dL — ABNORMAL HIGH (ref 65–99)
Potassium: 4.4 mmol/L (ref 3.5–5.3)
Sodium: 144 mmol/L (ref 135–146)
Total Bilirubin: 0.6 mg/dL (ref 0.2–1.2)
Total Protein: 6.3 g/dL (ref 6.1–8.1)

## 2018-12-31 LAB — CBC WITH DIFFERENTIAL/PLATELET
Absolute Monocytes: 667 cells/uL (ref 200–950)
Basophils Absolute: 7 cells/uL (ref 0–200)
Basophils Relative: 0.1 %
Eosinophils Absolute: 107 cells/uL (ref 15–500)
Eosinophils Relative: 1.5 %
HCT: 39.2 % (ref 35.0–45.0)
Hemoglobin: 13.1 g/dL (ref 11.7–15.5)
Lymphs Abs: 1207 cells/uL (ref 850–3900)
MCH: 32 pg (ref 27.0–33.0)
MCHC: 33.4 g/dL (ref 32.0–36.0)
MCV: 95.6 fL (ref 80.0–100.0)
MPV: 11.9 fL (ref 7.5–12.5)
Monocytes Relative: 9.4 %
Neutro Abs: 5112 cells/uL (ref 1500–7800)
Neutrophils Relative %: 72 %
Platelets: 138 10*3/uL — ABNORMAL LOW (ref 140–400)
RBC: 4.1 10*6/uL (ref 3.80–5.10)
RDW: 13.2 % (ref 11.0–15.0)
Total Lymphocyte: 17 %
WBC: 7.1 10*3/uL (ref 3.8–10.8)

## 2019-01-16 ENCOUNTER — Other Ambulatory Visit: Payer: Self-pay | Admitting: Nurse Practitioner

## 2019-01-16 DIAGNOSIS — R413 Other amnesia: Secondary | ICD-10-CM

## 2019-01-19 ENCOUNTER — Other Ambulatory Visit: Payer: Self-pay | Admitting: *Deleted

## 2019-01-19 DIAGNOSIS — E7800 Pure hypercholesterolemia, unspecified: Secondary | ICD-10-CM

## 2019-01-19 MED ORDER — ATORVASTATIN CALCIUM 10 MG PO TABS: 5.0000 mg | ORAL_TABLET | Freq: Every day | ORAL | 3 refills | Status: DC

## 2019-01-19 NOTE — Telephone Encounter (Signed)
Patient daughter [name] called and requested refill to be sent to [facility].

## 2019-02-07 ENCOUNTER — Other Ambulatory Visit: Payer: Self-pay | Admitting: Adult Health

## 2019-02-07 ENCOUNTER — Other Ambulatory Visit: Payer: Self-pay | Admitting: Nurse Practitioner

## 2019-02-07 DIAGNOSIS — E559 Vitamin D deficiency, unspecified: Secondary | ICD-10-CM

## 2019-02-15 ENCOUNTER — Telehealth: Payer: Self-pay | Admitting: Nurse Practitioner

## 2019-02-15 ENCOUNTER — Other Ambulatory Visit: Payer: Self-pay

## 2019-02-15 MED ORDER — LEVOTHYROXINE SODIUM 75 MCG PO TABS: ORAL_TABLET | ORAL | 1 refills | Status: DC

## 2019-02-15 NOTE — Telephone Encounter (Signed)
She should be taking synthroid 75 mcg daily  LEVOTHYROXINE (SYNTHROID) 75 MCG TABLET    TAKE 1 TABLET BY MOUTH DAILY BEFORE BREAKFAST

## 2019-02-15 NOTE — Telephone Encounter (Signed)
Patient called and said that she was advised from [facility] that we cancelled this RX. Did we cancel it or should it be refilled?

## 2019-03-02 ENCOUNTER — Other Ambulatory Visit: Payer: Self-pay | Admitting: Nurse Practitioner

## 2019-03-02 DIAGNOSIS — I10 Essential (primary) hypertension: Secondary | ICD-10-CM

## 2019-03-24 ENCOUNTER — Ambulatory Visit: Payer: Self-pay | Admitting: Nurse Practitioner

## 2019-05-31 ENCOUNTER — Other Ambulatory Visit: Payer: Self-pay

## 2019-05-31 ENCOUNTER — Encounter: Payer: Self-pay | Admitting: Nurse Practitioner

## 2019-05-31 ENCOUNTER — Ambulatory Visit (INDEPENDENT_AMBULATORY_CARE_PROVIDER_SITE_OTHER): Payer: Medicare HMO | Admitting: Nurse Practitioner

## 2019-05-31 VITALS — BP 122/70 | HR 60 | Temp 96.8°F | Ht 66.0 in | Wt 127.0 lb

## 2019-05-31 DIAGNOSIS — F0150 Vascular dementia without behavioral disturbance: Secondary | ICD-10-CM | POA: Diagnosis not present

## 2019-05-31 DIAGNOSIS — N184 Chronic kidney disease, stage 4 (severe): Secondary | ICD-10-CM

## 2019-05-31 DIAGNOSIS — E785 Hyperlipidemia, unspecified: Secondary | ICD-10-CM

## 2019-05-31 DIAGNOSIS — F330 Major depressive disorder, recurrent, mild: Secondary | ICD-10-CM

## 2019-05-31 DIAGNOSIS — M8000XS Age-related osteoporosis with current pathological fracture, unspecified site, sequela: Secondary | ICD-10-CM | POA: Diagnosis not present

## 2019-05-31 DIAGNOSIS — I10 Essential (primary) hypertension: Secondary | ICD-10-CM

## 2019-05-31 DIAGNOSIS — Z7189 Other specified counseling: Secondary | ICD-10-CM

## 2019-05-31 DIAGNOSIS — E039 Hypothyroidism, unspecified: Secondary | ICD-10-CM | POA: Diagnosis not present

## 2019-05-31 DIAGNOSIS — F419 Anxiety disorder, unspecified: Secondary | ICD-10-CM

## 2019-05-31 DIAGNOSIS — R634 Abnormal weight loss: Secondary | ICD-10-CM | POA: Diagnosis not present

## 2019-05-31 MED ORDER — SERTRALINE HCL 50 MG PO TABS: ORAL_TABLET | ORAL | 0 refills | Status: DC

## 2019-05-31 MED ORDER — MIRTAZAPINE 7.5 MG PO TABS: 7.5000 mg | ORAL_TABLET | Freq: Every day | ORAL | 1 refills | Status: DC

## 2019-05-31 NOTE — Progress Notes (Signed)
Careteam: Patient Care Team: [name], NP as PCP - General (Nurse Practitioner)  PLACE OF SERVICE:  [place] Directive information Does Patient Have a Medical Advance Directive?: Yes, Type of Advance Directive: [place];Living will, Does patient want to make changes to medical advance directive?: No - Patient declined  Allergies  Allergen Reactions  . Aricept [Donepezil Hcl]   . Aspirin     Upset stomach     Chief Complaint  Patient presents with  . Medical Management of Chronic Issues    5 month follow-up. Here with daughter [name]      HPI: Patient is a 86 y.o. female for routine follow up.   Dementia- Daughter reports she does not do much in her facility, sitting in room more (which she had to do with COVID. She has had both vaccines now and daughter allowed to visit now.  She will make comments that she wants to "get out of here" which she says a lot in regards to her facility but today she likes it.  Daughter brings her to her house for lunch to switch it up.   Depression/anxiety- taking wellbutrin 150 mg daily and remeron at bedtime (for weight and depression)   Vit D- continues on supplement.   TSH- continues on synthroid 75 mcg   htn- stable at this time, continues on losartan, hydralazine and metoprolol   Poor dentition- has some bad teeth but not bothering her and she does not wish to have anything done. Daughter trying to be proactive and she feels like she may be making her mom more anxious.    Review of Systems:  Review of Systems  Constitutional: Negative for chills, fever and weight loss.  HENT: Negative for tinnitus.   Respiratory: Negative for cough, sputum production and shortness of breath.   Cardiovascular: Negative for chest pain, palpitations and leg swelling.  Gastrointestinal: Negative for abdominal pain, constipation, diarrhea and heartburn.  Genitourinary: Negative for dysuria, frequency and  urgency.  Musculoskeletal: Negative for back pain, falls, joint pain and myalgias.  Skin: Negative.   Neurological: Negative for dizziness and headaches.  Psychiatric/Behavioral: Positive for depression and memory loss. The patient is nervous/anxious. The patient does not have insomnia.     Past Medical History:  Diagnosis Date  . Anxiety   . Cancer ([place]) 12/01/2018   skin cancer on her head(L)  . High cholesterol   . Hypertension   . Osteoporosis   . Rosacea   . Thyroid disease   . Weight loss    Past Surgical History:  Procedure Laterality Date  . CHOLECYSTECTOMY  1959   [place]  . THYROIDECTOMY  2003   [place]  . TONSILLECTOMY AND ADENOIDECTOMY     Social History:   reports that she has never smoked. She has never used smokeless tobacco. She reports that she does not drink alcohol or use drugs.  Family History  Problem Relation Age of Onset  . Hypertension Mother   . Stroke Mother   . Stroke Father   . Lung cancer Daughter     Medications: Patient's Medications  New Prescriptions   No medications on file  Previous Medications   ATORVASTATIN (LIPITOR) 10 MG TABLET    Take 0.5 tablets (5 mg total) by mouth daily.   BUPROPION (WELLBUTRIN XL) 150 MG 24 HR TABLET    TAKE 1 TABLET DAILY IN THE MORNING FOR ANXIETY   HYDRALAZINE (APRESOLINE) 25 MG TABLET    TAKE  1 TABLET TWICE A DAY   LEVOTHYROXINE (SYNTHROID) 75 MCG TABLET    TAKE 1 TABLET BY MOUTH DAILY BEFORE BREAKFAST   LOSARTAN (COZAAR) 100 MG TABLET    TAKE 1 TABLET DAILY   MEMANTINE (NAMENDA XR) 14 MG CP24 24 HR CAPSULE    TAKE 1 CAPSULE DAILY FOR MEMORY   METOPROLOL TARTRATE (LOPRESSOR) 50 MG TABLET    TAKE 1 TABLET TWICE A DAY   MIRTAZAPINE (REMERON) 15 MG TABLET    TAKE 1 TABLET ONCE DAILY DUE TO WEIGHT LOSS TO INCREASE APPETITE AND ANXIETY, DEPRESSION   MULTIPLE VITAMIN (MULTIVITAMIN WITH MINERALS) TABS TABLET    Take 2 tablets by mouth daily.    VITAMIN D, ERGOCALCIFEROL, (DRISDOL) 1.25 MG (50000 UT) CAPS  CAPSULE    TAKE 1 CAPSULE EVERY 7 DAYS  Modified Medications   No medications on file  Discontinued Medications   No medications on file    Physical Exam:  Vitals:   05/31/19 1420  BP: 122/70  Pulse: 60  Temp: (!) 96.8 F (36 C)  TempSrc: Temporal  SpO2: 98%  Weight: 127 lb (57.6 kg)  Height: '5\' 6"'  (1.676 m)   Body mass index is 20.5 kg/m. Wt Readings from Last 3 Encounters:  05/31/19 127 lb (57.6 kg)  12/30/18 129 lb 3.2 oz (58.6 kg)  08/11/18 138 lb (62.6 kg)    Physical Exam Constitutional:      General: She is not in acute distress.    Appearance: She is well-developed. She is not diaphoretic.  HENT:     Head: Normocephalic and atraumatic.     Mouth/Throat:     Pharynx: No oropharyngeal exudate.  Eyes:     Conjunctiva/sclera: Conjunctivae normal.     Pupils: Pupils are equal, round, and reactive to light.  Cardiovascular:     Rate and Rhythm: Normal rate and regular rhythm.     Heart sounds: Normal heart sounds.  Pulmonary:     Effort: Pulmonary effort is normal.     Breath sounds: Normal breath sounds.  Abdominal:     General: Bowel sounds are normal.     Palpations: Abdomen is soft.  Musculoskeletal:        General: No tenderness.     Cervical back: Normal range of motion and neck supple.  Skin:    General: Skin is warm and dry.  Neurological:     Mental Status: She is alert.     Motor: Weakness present.     Gait: Gait abnormal (refuses walker per daughter).     Labs reviewed: Basic Metabolic Panel: Recent Labs    08/11/18 1404 12/30/18 1529  NA 145 144  K 4.4 4.4  CL 107 107  CO2 32 29  GLUCOSE 89 112*  BUN 25 22  CREATININE 1.49* 1.52*  CALCIUM 8.3* 8.2*  TSH 5.84* 4.56*   Liver Function Tests: Recent Labs    08/11/18 1404 12/30/18 1529  AST 22 17  ALT 18 10  BILITOT 0.7 0.6  PROT 7.0 6.3   No results for input(s): LIPASE, AMYLASE in the last 8760 hours. No results for input(s): AMMONIA in the last 8760  hours. CBC: Recent Labs    08/11/18 1404 12/30/18 1529  WBC 8.4 7.1  NEUTROABS 6,325 5,112  HGB 14.0 13.1  HCT 42.3 39.2  MCV 97.2 95.6  PLT 127* 138*   Lipid Panel: Recent Labs    08/11/18 1404 12/30/18 1529  CHOL 201* 181  HDL 41* 37*  LDLCALC  121* 109*  TRIG 240* 228*  CHOLHDL 4.9 4.9   TSH: Recent Labs    08/11/18 1404 12/30/18 1529  TSH 5.84* 4.56*   A1C: Lab Results  Component Value Date   HGBA1C (H) 11/23/2009    5.7 (NOTE)                                                                       According to the ADA Clinical Practice Recommendations for 2011, when HbA1c is used as a screening test:   >=6.5%   Diagnostic of Diabetes Mellitus           (if abnormal result  is confirmed)  5.7-6.4%   Increased risk of developing Diabetes Mellitus  References:Diagnosis and Classification of Diabetes Mellitus,Diabetes GEFU,0721,82(EQFDV 1):S62-S69 and Standards of Medical Care in         Diabetes - 2011,Diabetes OUZH,4604,79  (Suppl 1):S11-S61.     Assessment/Plan 1. Vascular dementia without behavioral disturbance (HCC) -slow progressive decline however for the most part stable. She lives independently at facility and has someone help her with medication. Daughter thinking of moving her into assisted living due to dementia but doing well where she is at this time. Continues on namenda 14 mg daily  2. Hypothyroidism, unspecified type -continues on synthroid, will follow up TSH - TSH  3. Osteoporosis with current pathological fracture, unspecified osteoporosis type, sequela -declines medication, continues on vit d supplement.   4. Loss of weight -reports decrease appetite but states she still is eating what she wants. Encouraged daughter to join her for more meals if able. Continue  sup - mirtazapine (REMERON) 7.5 MG tablet; Take 1 tablet (7.5 mg total) by mouth at bedtime.  Dispense: 90 tablet; Refill: 1  5. CKD (chronic kidney disease) stage 4, GFR 15-29 ml/min  (HCC) -Encourage proper hydration and to avoid NSAIDS (Aleve, Advil, Motrin, Ibuprofen), dose adjust medications as needed  - CMP with eGFR([facility]) - CBC with Differential/Platelet  6. Hyperlipidemia LDL goal <100 -continues on lipitor   7. Essential hypertension -stable on current regimen  - CMP with eGFR([facility])  8. Anxiety -worsening anxiety, will start zoloft 25 mg by mouth daily for 2 weeks then to increase to 50 mg daily   9. Advance care planning -most from completed today with pt and daughter.  - DNR (Do Not Resuscitate)  10. depression Ongoing and stable. Continues on wellbutrin, will add zoloft due to anxiety.  On low dose remeron for appetite stimulate, will decrease dose since starting zoloft.  - mirtazapine (REMERON) 7.5 MG tablet; Take 1 tablet (7.5 mg total) by mouth at bedtime.  Dispense: 90 tablet; Refill: 1  Next appt: 5 weeks for mood [facility] [place], [facility] [phone]

## 2019-05-31 NOTE — Patient Instructions (Addendum)
Start zoloft 50 mg by mouth 1/2 tablet (25 mg daily) for 2 weeks then to increase to zoloft 50 mg daily for anxiety and depression  If tolerating well after 1 month call us for refill.    Decrease remeron to 7.5 mg by mouth at bedtime.   Follow up in 5 weeks for mood

## 2019-06-01 LAB — COMPLETE METABOLIC PANEL WITH GFR
AG Ratio: 1.8 (calc) (ref 1.0–2.5)
ALT: 13 U/L (ref 6–29)
AST: 19 U/L (ref 10–35)
Albumin: 4.1 g/dL (ref 3.6–5.1)
Alkaline phosphatase (APISO): 79 U/L (ref 37–153)
BUN/Creatinine Ratio: 19 (calc) (ref 6–22)
BUN: 27 mg/dL — ABNORMAL HIGH (ref 7–25)
CO2: 27 mmol/L (ref 20–32)
Calcium: 8.3 mg/dL — ABNORMAL LOW (ref 8.6–10.4)
Chloride: 108 mmol/L (ref 98–110)
Creat: 1.4 mg/dL — ABNORMAL HIGH (ref 0.60–0.88)
GFR, Est African American: 37 mL/min/{1.73_m2} — ABNORMAL LOW (ref >=60)
GFR, Est Non African American: 32 mL/min/{1.73_m2} — ABNORMAL LOW (ref >=60)
Globulin: 2.3 g/dL (calc) (ref 1.9–3.7)
Glucose, Bld: 105 mg/dL (ref 65–139)
Potassium: 3.7 mmol/L (ref 3.5–5.3)
Sodium: 145 mmol/L (ref 135–146)
Total Bilirubin: 0.7 mg/dL (ref 0.2–1.2)
Total Protein: 6.4 g/dL (ref 6.1–8.1)

## 2019-06-01 LAB — CBC WITH DIFFERENTIAL/PLATELET
Absolute Monocytes: 579 cells/uL (ref 200–950)
Basophils Absolute: 20 cells/uL (ref 0–200)
Basophils Relative: 0.3 %
Eosinophils Absolute: 111 cells/uL (ref 15–500)
Eosinophils Relative: 1.7 %
HCT: 41 % (ref 35.0–45.0)
Hemoglobin: 13.5 g/dL (ref 11.7–15.5)
Lymphs Abs: 1073 cells/uL (ref 850–3900)
MCH: 31.3 pg (ref 27.0–33.0)
MCHC: 32.9 g/dL (ref 32.0–36.0)
MCV: 94.9 fL (ref 80.0–100.0)
MPV: 11.5 fL (ref 7.5–12.5)
Monocytes Relative: 8.9 %
Neutro Abs: 4719 cells/uL (ref 1500–7800)
Neutrophils Relative %: 72.6 %
Platelets: 134 10*3/uL — ABNORMAL LOW (ref 140–400)
RBC: 4.32 10*6/uL (ref 3.80–5.10)
RDW: 13.3 % (ref 11.0–15.0)
Total Lymphocyte: 16.5 %
WBC: 6.5 10*3/uL (ref 3.8–10.8)

## 2019-06-01 LAB — TSH: TSH: 1.53 mIU/L (ref 0.40–4.50)

## 2019-06-02 ENCOUNTER — Other Ambulatory Visit: Payer: Self-pay | Admitting: Internal Medicine

## 2019-06-02 DIAGNOSIS — F32A Depression, unspecified: Secondary | ICD-10-CM

## 2019-06-02 DIAGNOSIS — F329 Major depressive disorder, single episode, unspecified: Secondary | ICD-10-CM

## 2019-06-10 ENCOUNTER — Telehealth: Payer: Self-pay | Admitting: *Deleted

## 2019-06-10 NOTE — Telephone Encounter (Signed)
Lets have her completely stop remeron at this time, (she was instructed to cut in half) if diarrhea continues may need to switch medications all together

## 2019-06-10 NOTE — Telephone Encounter (Signed)
Lauren [name], daughter called and stated that Lauren [name] placed patient on Zoloft at last visit 05/31/19. Patient is in her 2nd week of taking 1/2 tablet of Zoloft daily and has Diarrhea on and off since starting medication. No other symptoms noted. Please Advise.   OV Note dated 05/31/19: 8. Anxiety -worsening anxiety, will start zoloft 25 mg by mouth daily for 2 weeks then to increase to 50 mg daily

## 2019-06-10 NOTE — Telephone Encounter (Signed)
Patient daughter notified and agreed. Will stop the Remeron completely. Will call back next week and let us know how patient is doing.  Medication list updated.

## 2019-06-15 ENCOUNTER — Telehealth: Payer: Self-pay

## 2019-06-15 NOTE — Telephone Encounter (Signed)
Incoming call received from Toney Rakes (patients daughter) per request of provider told to call in 2 weeks with update on zoloft. Patient took 1/2 tablet x 2 weeks and handled it well. Patient will now increase to a whole table.  Patient was told to discontinue remeron and now patient is hardly eating. Patient will eat toast and jam in the morning then mess over groceries that daughter provides. Cathy had lunch with patient yesterday and she had 1 bite of pot roast and did not eat anything else on her plate. FYI  Patient lives in [place] at a facility.

## 2019-06-15 NOTE — Telephone Encounter (Signed)
Okay lets have her continue the 1/2 tablet of Zoloft with the remeron 7.5 mg at bedtime since her eating as gotten worse.

## 2019-06-15 NOTE — Telephone Encounter (Signed)
Discussed response with patients daughter [name]. Per [name] patient has a large supply of Remeron 15 mg that she will cut in half for now to avoid waisting supplies.  Patient will continue zoloft 1/2 tablet of 50 mg. Medication list updated to reflect [name], NP instructions  Pending follow-up next month

## 2019-06-22 ENCOUNTER — Telehealth: Payer: Self-pay

## 2019-06-22 DIAGNOSIS — Z111 Encounter for screening for respiratory tuberculosis: Secondary | ICD-10-CM

## 2019-06-22 DIAGNOSIS — F0150 Vascular dementia without behavioral disturbance: Secondary | ICD-10-CM

## 2019-06-22 NOTE — Telephone Encounter (Signed)
Spoke with [name], per Callery the memory care unit was very depressing. [name] also visited a memory care unit at a sister facility and it was a little more up beat yet she just doesn't want to put her mom in this position. [name] states this is really a tough situation, she is losing sleep, and  can hardly eat at the thought of handling this. [name] does not have any family that can help. [name] states she will get though this although she may have a break down in the process.   I advise [name] to call if we can do anything further to assist

## 2019-06-22 NOTE — Telephone Encounter (Signed)
Patients daughter [name] called asking for a second opinion. Patient currently resides in independent living and the daughter was trying to move her to AL (assisted living). The facility is recommending memory care, patient was evaluated by a nurse at the facility. Cathy visited memory care. [name] does not feel that her mother needs that level of care and is afraid she will decline rapidly if moved to memory care. [name] did not get any rest last night due to the circumstances surrounding this situation.   [name] has decided to put the move on hold until she can get advice or recommendation from [name], NP. Patient has a pending appointment on 07/05/2019, yet [name] was wondering if Janett Billow could give advice based on previous interactions with patient. Last OV 05/31/2019  Please advise

## 2019-06-22 NOTE — Telephone Encounter (Signed)
I think memory care would be beneficial and do not think that the move into memory care itself would cause her a decline; it may be more stimulating and that would be good given her memory loss. She would have more supervision and this would be great for her safety.

## 2019-06-23 NOTE — Telephone Encounter (Addendum)
Patient needs Quanit-Feron Blood test done for TB Called and scheduled lab appointment with daughter for tomorrow to have drawn.  I cannot locate the order for the blood draw. Please place order, patient is coming in Thursday at 2. Forwarded to [place] to place order.

## 2019-06-23 NOTE — Telephone Encounter (Signed)
Received fax on my desk requesting me to call daughter regarding FL2 form for [facility] for Memory Care.  Daughter is requesting the FL2 form. Patient is moving next week into Memory Care at [facility]. Daughter stated that she received a call earlier today that patient had wandered off. Daughter is ready to have the move take place.  Filled out form and placed in [place] folder to review, fill out and sign. To be faxed back to [facility] Fax: [phone]

## 2019-06-24 ENCOUNTER — Other Ambulatory Visit: Payer: Self-pay

## 2019-06-24 ENCOUNTER — Other Ambulatory Visit: Payer: Self-pay | Admitting: Nurse Practitioner

## 2019-06-24 ENCOUNTER — Other Ambulatory Visit: Payer: Medicare HMO

## 2019-06-24 DIAGNOSIS — Z111 Encounter for screening for respiratory tuberculosis: Secondary | ICD-10-CM

## 2019-06-24 DIAGNOSIS — F0150 Vascular dementia without behavioral disturbance: Secondary | ICD-10-CM

## 2019-06-24 NOTE — Addendum Note (Signed)
Addended by: [name] on: 06/24/2019 09:29 AM   Modules accepted: Orders

## 2019-06-24 NOTE — Telephone Encounter (Signed)
High risk or very high risk warning populated when attempting to refill medication. RX request sent to PCP for review and approval if warranted.   

## 2019-06-25 NOTE — Telephone Encounter (Signed)
Lauren [name] with [employer] called requesting the paperwork that was already filled out to be faxed to her at Fax:[phone]. Informed her we were waiting for the TB Bloodtest and she said that was fine just to fax what was already completed. Faxed.

## 2019-06-26 LAB — QUANTIFERON-TB GOLD PLUS
Mitogen-NIL: >10 IU/mL
NIL: 0.03 IU/mL
QuantiFERON-TB Gold Plus: NEGATIVE
TB1-NIL: 0.04 IU/mL
TB2-NIL: 0.02 IU/mL

## 2019-07-05 ENCOUNTER — Ambulatory Visit: Payer: Medicare HMO | Admitting: Nurse Practitioner

## 2019-12-01 ENCOUNTER — Encounter (HOSPITAL_COMMUNITY): Payer: Self-pay | Admitting: Emergency Medicine

## 2019-12-01 ENCOUNTER — Inpatient Hospital Stay (HOSPITAL_COMMUNITY)
Admission: EM | Admit: 2019-12-01 | Discharge: 2019-12-04 | DRG: 193 | Disposition: A | Discharge status: Home Health | Payer: Medicare HMO | Attending: Internal Medicine | Admitting: Internal Medicine

## 2019-12-01 ENCOUNTER — Emergency Department (HOSPITAL_COMMUNITY): Payer: Medicare HMO

## 2019-12-01 ENCOUNTER — Other Ambulatory Visit: Payer: Self-pay

## 2019-12-01 DIAGNOSIS — R627 Adult failure to thrive: Secondary | ICD-10-CM | POA: Diagnosis present

## 2019-12-01 DIAGNOSIS — I959 Hypotension, unspecified: Secondary | ICD-10-CM | POA: Diagnosis not present

## 2019-12-01 DIAGNOSIS — M810 Age-related osteoporosis without current pathological fracture: Secondary | ICD-10-CM | POA: Diagnosis present

## 2019-12-01 DIAGNOSIS — Z79890 Hormone replacement therapy (postmenopausal): Secondary | ICD-10-CM

## 2019-12-01 DIAGNOSIS — J9601 Acute respiratory failure with hypoxia: Secondary | ICD-10-CM | POA: Diagnosis present

## 2019-12-01 DIAGNOSIS — Z85828 Personal history of other malignant neoplasm of skin: Secondary | ICD-10-CM

## 2019-12-01 DIAGNOSIS — J181 Lobar pneumonia, unspecified organism: Principal | ICD-10-CM | POA: Diagnosis present

## 2019-12-01 DIAGNOSIS — D696 Thrombocytopenia, unspecified: Secondary | ICD-10-CM | POA: Diagnosis present

## 2019-12-01 DIAGNOSIS — E876 Hypokalemia: Secondary | ICD-10-CM | POA: Diagnosis not present

## 2019-12-01 DIAGNOSIS — N179 Acute kidney failure, unspecified: Secondary | ICD-10-CM | POA: Diagnosis not present

## 2019-12-01 DIAGNOSIS — Z66 Do not resuscitate: Secondary | ICD-10-CM | POA: Diagnosis present

## 2019-12-01 DIAGNOSIS — D631 Anemia in chronic kidney disease: Secondary | ICD-10-CM | POA: Diagnosis present

## 2019-12-01 DIAGNOSIS — J189 Pneumonia, unspecified organism: Secondary | ICD-10-CM | POA: Diagnosis not present

## 2019-12-01 DIAGNOSIS — Z823 Family history of stroke: Secondary | ICD-10-CM

## 2019-12-01 DIAGNOSIS — E7800 Pure hypercholesterolemia, unspecified: Secondary | ICD-10-CM | POA: Diagnosis present

## 2019-12-01 DIAGNOSIS — J9690 Respiratory failure, unspecified, unspecified whether with hypoxia or hypercapnia: Secondary | ICD-10-CM

## 2019-12-01 DIAGNOSIS — Z801 Family history of malignant neoplasm of trachea, bronchus and lung: Secondary | ICD-10-CM

## 2019-12-01 DIAGNOSIS — I129 Hypertensive chronic kidney disease with stage 1 through stage 4 chronic kidney disease, or unspecified chronic kidney disease: Secondary | ICD-10-CM | POA: Diagnosis present

## 2019-12-01 DIAGNOSIS — Z8249 Family history of ischemic heart disease and other diseases of the circulatory system: Secondary | ICD-10-CM

## 2019-12-01 DIAGNOSIS — I10 Essential (primary) hypertension: Secondary | ICD-10-CM | POA: Diagnosis present

## 2019-12-01 DIAGNOSIS — F419 Anxiety disorder, unspecified: Secondary | ICD-10-CM | POA: Diagnosis present

## 2019-12-01 DIAGNOSIS — Z79899 Other long term (current) drug therapy: Secondary | ICD-10-CM

## 2019-12-01 DIAGNOSIS — Z886 Allergy status to analgesic agent status: Secondary | ICD-10-CM

## 2019-12-01 DIAGNOSIS — Z20822 Contact with and (suspected) exposure to covid-19: Secondary | ICD-10-CM | POA: Diagnosis present

## 2019-12-01 DIAGNOSIS — Z888 Allergy status to other drugs, medicaments and biological substances status: Secondary | ICD-10-CM

## 2019-12-01 DIAGNOSIS — Z681 Body mass index (BMI) 19 or less, adult: Secondary | ICD-10-CM

## 2019-12-01 DIAGNOSIS — F0390 Unspecified dementia without behavioral disturbance: Secondary | ICD-10-CM | POA: Diagnosis present

## 2019-12-01 DIAGNOSIS — N1832 Chronic kidney disease, stage 3b: Secondary | ICD-10-CM | POA: Diagnosis present

## 2019-12-01 DIAGNOSIS — E890 Postprocedural hypothyroidism: Secondary | ICD-10-CM | POA: Diagnosis present

## 2019-12-01 LAB — COMPREHENSIVE METABOLIC PANEL
ALT: 10 U/L (ref 0–44)
AST: 19 U/L (ref 15–41)
Albumin: 2.8 g/dL — ABNORMAL LOW (ref 3.5–5.0)
Alkaline Phosphatase: 63 U/L (ref 38–126)
Anion gap: 12 (ref 5–15)
BUN: 27 mg/dL — ABNORMAL HIGH (ref 8–23)
CO2: 28 mmol/L (ref 22–32)
Calcium: 8.4 mg/dL — ABNORMAL LOW (ref 8.9–10.3)
Chloride: 105 mmol/L (ref 98–111)
Creatinine, Ser: 1.43 mg/dL — ABNORMAL HIGH (ref 0.44–1.00)
GFR calc Af Amer: 36 mL/min — ABNORMAL LOW (ref >60)
GFR calc non Af Amer: 31 mL/min — ABNORMAL LOW (ref >60)
Glucose, Bld: 118 mg/dL — ABNORMAL HIGH (ref 70–99)
Potassium: 4 mmol/L (ref 3.5–5.1)
Sodium: 145 mmol/L (ref 135–145)
Total Bilirubin: 0.7 mg/dL (ref 0.3–1.2)
Total Protein: 5.7 g/dL — ABNORMAL LOW (ref 6.5–8.1)

## 2019-12-01 LAB — PROCALCITONIN: Procalcitonin: <0.1 ng/mL

## 2019-12-01 LAB — CBC WITH DIFFERENTIAL/PLATELET
Abs Immature Granulocytes: 0.03 10*3/uL (ref 0.00–0.07)
Basophils Absolute: 0 10*3/uL (ref 0.0–0.1)
Basophils Relative: 0 %
Eosinophils Absolute: 0.4 10*3/uL (ref 0.0–0.5)
Eosinophils Relative: 5 %
HCT: 32.8 % — ABNORMAL LOW (ref 36.0–46.0)
Hemoglobin: 10.5 g/dL — ABNORMAL LOW (ref 12.0–15.0)
Immature Granulocytes: 0 %
Lymphocytes Relative: 12 %
Lymphs Abs: 1 10*3/uL (ref 0.7–4.0)
MCH: 30.3 pg (ref 26.0–34.0)
MCHC: 32 g/dL (ref 30.0–36.0)
MCV: 94.8 fL (ref 80.0–100.0)
Monocytes Absolute: 1.1 10*3/uL — ABNORMAL HIGH (ref 0.1–1.0)
Monocytes Relative: 14 %
Neutro Abs: 5.6 10*3/uL (ref 1.7–7.7)
Neutrophils Relative %: 69 %
Platelets: 156 10*3/uL (ref 150–400)
RBC: 3.46 MIL/uL — ABNORMAL LOW (ref 3.87–5.11)
RDW: 14.9 % (ref 11.5–15.5)
WBC: 8 10*3/uL (ref 4.0–10.5)
nRBC: 0 % (ref 0.0–0.2)

## 2019-12-01 LAB — CBC
HCT: 36 % (ref 36.0–46.0)
Hemoglobin: 11.6 g/dL — ABNORMAL LOW (ref 12.0–15.0)
MCH: 30.2 pg (ref 26.0–34.0)
MCHC: 32.2 g/dL (ref 30.0–36.0)
MCV: 93.8 fL (ref 80.0–100.0)
Platelets: 144 10*3/uL — ABNORMAL LOW (ref 150–400)
RBC: 3.84 MIL/uL — ABNORMAL LOW (ref 3.87–5.11)
RDW: 14.8 % (ref 11.5–15.5)
WBC: 9.2 10*3/uL (ref 4.0–10.5)
nRBC: 0 % (ref 0.0–0.2)

## 2019-12-01 LAB — CREATININE, SERUM
Creatinine, Ser: 1.35 mg/dL — ABNORMAL HIGH (ref 0.44–1.00)
GFR calc Af Amer: 39 mL/min — ABNORMAL LOW (ref >60)
GFR calc non Af Amer: 34 mL/min — ABNORMAL LOW (ref >60)

## 2019-12-01 LAB — SARS CORONAVIRUS 2 BY RT PCR (HOSPITAL ORDER, PERFORMED IN ~~LOC~~ HOSPITAL LAB): SARS Coronavirus 2: NEGATIVE

## 2019-12-01 LAB — HIV ANTIBODY (ROUTINE TESTING W REFLEX): HIV Screen 4th Generation wRfx: NONREACTIVE

## 2019-12-01 MED ORDER — SERTRALINE HCL 25 MG PO TABS
25.0000 mg | ORAL_TABLET | Freq: Every day | ORAL | Status: DC
  Administered 2019-12-01 – 2019-12-04 (×4): 25 mg via ORAL
  Filled 2019-12-01 (×4): qty 1

## 2019-12-01 MED ORDER — VANCOMYCIN HCL 1250 MG/250ML IV SOLN
1250.0000 mg | Freq: Once | INTRAVENOUS | Status: AC
  Administered 2019-12-01: 1250 mg via INTRAVENOUS
  Filled 2019-12-01: qty 250

## 2019-12-01 MED ORDER — METOPROLOL TARTRATE 5 MG/5ML IV SOLN: 5.0000 mg | Freq: Four times a day (QID) | INTRAVENOUS | Status: DC | PRN

## 2019-12-01 MED ORDER — SODIUM CHLORIDE 0.9 % IV BOLUS
500.0000 mL | Freq: Once | INTRAVENOUS | Status: AC
  Administered 2019-12-01: 500 mL via INTRAVENOUS

## 2019-12-01 MED ORDER — MIRTAZAPINE 15 MG PO TABS
7.5000 mg | ORAL_TABLET | Freq: Every day | ORAL | Status: DC
  Administered 2019-12-01 – 2019-12-03 (×3): 7.5 mg via ORAL
  Filled 2019-12-01 (×3): qty 1

## 2019-12-01 MED ORDER — ENOXAPARIN SODIUM 30 MG/0.3ML ~~LOC~~ SOLN
30.0000 mg | SUBCUTANEOUS | Status: DC
  Administered 2019-12-01 – 2019-12-03 (×3): 30 mg via SUBCUTANEOUS
  Filled 2019-12-01 (×3): qty 0.3

## 2019-12-01 MED ORDER — SODIUM CHLORIDE 0.9 % IV SOLN
2.0000 g | Freq: Once | INTRAVENOUS | Status: AC
  Administered 2019-12-01: 2 g via INTRAVENOUS
  Filled 2019-12-01: qty 2

## 2019-12-01 MED ORDER — ATORVASTATIN CALCIUM 10 MG PO TABS
5.0000 mg | ORAL_TABLET | Freq: Every day | ORAL | Status: DC
  Administered 2019-12-01 – 2019-12-04 (×4): 5 mg via ORAL
  Filled 2019-12-01 (×4): qty 1

## 2019-12-01 MED ORDER — MEMANTINE HCL ER 14 MG PO CP24
14.0000 mg | ORAL_CAPSULE | Freq: Every day | ORAL | Status: DC
  Administered 2019-12-02 – 2019-12-04 (×3): 14 mg via ORAL
  Filled 2019-12-01 (×3): qty 1

## 2019-12-01 MED ORDER — LEVOTHYROXINE SODIUM 88 MCG PO TABS
88.0000 ug | ORAL_TABLET | Freq: Every day | ORAL | Status: DC
  Administered 2019-12-01 – 2019-12-03 (×3): 88 ug via ORAL
  Filled 2019-12-01 (×2): qty 1

## 2019-12-01 MED ORDER — BUPROPION HCL ER (XL) 150 MG PO TB24
150.0000 mg | ORAL_TABLET | Freq: Every day | ORAL | Status: DC
  Administered 2019-12-01 – 2019-12-04 (×4): 150 mg via ORAL
  Filled 2019-12-01 (×4): qty 1

## 2019-12-01 NOTE — Progress Notes (Signed)
A consult was received from an ED physician for vancomycinper pharmacy dosing.  The patient's profile has been reviewed for ht/wt/allergies/indication/available labs.    - weight ~58 kg documented in Epic on 05/31/19  A one time order has been placed for vancomycin 1250 mg IV x1.  Further antibiotics/pharmacy consults should be ordered by admitting physician if indicated.                       Thank you, Lynelle Doctor 12/01/2019  1:07 PM

## 2019-12-01 NOTE — ED Triage Notes (Signed)
Patient presents from [facility] unit. While performing vitals signs today staff noted a low BP and SPO2. Patient has no complaints and is asymptomatic. Patient also complains of a cough of unknown length of time.  HX: [place]   EMS vitals: 94/ 50 (first) 108/60 (last) BP 60 HR 22 Resp Rate 86% SPO2 on Room air 97% SPO2 on 2L O2

## 2019-12-01 NOTE — H&P (Signed)
History and Physical        Hospital Admission Note Date: 12/01/2019  Patient name: Lauren [name] Medical record number: [id number] Date of birth: [birth year] Age: 86 y.o. Gender: female  PCP: Patient, No Pcp Per  Patient coming from: [facility]   Chief Complaint    Chief Complaint  Patient presents with  . Hypotension      HPI:   Patient is a poor historian due to her dementia.  History obtained from daughter at bedside and ED note  This is a 86 year old female with past medical history of dementia and hypertension presented to the ED today due to abnormal vital signs.  According to the prior note, patient's ALF checks vital signs once a month and checked her routine vitals today and she was found to have a blood pressure of 94/48 and a pulse ox of 86% prompting her to be sent to the ED.  Patient's daughter at bedside states the patient has been treated for pneumonia with antibiotics over the past 1 to 2 weeks due to an underlying cough and was apparently treated with azithromycin and Levaquin.  Currently the patient denies any complaints    ED Course: Afebrile and hemodynamically stable initially requiring 4 L/min O2.BP 95/57 at the lowest improved to 122/65.  At the bedside, I turned the supplemental O2 to room air and patient's SPO2 dropped from 97% to 88%, she was placed back on 2 L.  Labs overall unremarkable.  CXR concerning for pneumonia with left upper and left lower lobe opacities and probable small left pleural effusion, minimal right basilar subsegmental atelectasis noted.  She was given vancomycin and cefepime and 500 cc NS bolus.  Vitals:   12/01/19 1530 12/01/19 1600  BP: 113/84 122/65  Pulse: 77 70  Resp: 16 19  Temp:    SpO2: 92% 94%     Review of Systems:  Review of Systems  Unable to perform ROS: Dementia     Medical/Social/Family History   Past Medical History: Past Medical History:  Diagnosis Date  . Anxiety   . Cancer ([place]) 12/01/2018   skin cancer on her head(L)  . High cholesterol   . Hypertension   . Osteoporosis   . Rosacea   . Thyroid disease   . Weight loss     Past Surgical History:  Procedure Laterality Date  . CHOLECYSTECTOMY  1959   [place]  . THYROIDECTOMY  2003   [place]  . TONSILLECTOMY AND ADENOIDECTOMY      Medications: Prior to Admission medications   Medication Sig Start Date End Date Taking? Authorizing Provider  amLODipine (NORVASC) 5 MG tablet Take 5 mg by mouth daily.   Yes [provider]  atorvastatin (LIPITOR) 10 MG tablet Take 0.5 tablets (5 mg total) by mouth daily. 01/19/19  Yes [name], NP  buPROPion (WELLBUTRIN XL) 150 MG 24 hr tablet TAKE 1 TABLET DAILY IN THE MORNING FOR ANXIETY Patient taking differently: Take 150 mg by mouth daily.  06/03/19  Yes [name], NP  hydrALAZINE (APRESOLINE) 25 MG tablet TAKE 1 TABLET TWICE A DAY Patient taking differently: Take 25 mg by mouth in the morning and at bedtime.  03/02/19  Yes [name], NP  levothyroxine (SYNTHROID) 88 MCG tablet Take 88 mcg by mouth daily. 08/20/19  Yes [provider]  losartan (COZAAR) 100 MG tablet TAKE 1 TABLET DAILY Patient taking differently: Take 100 mg by mouth daily.  07/17/18  Yes [name], NP  memantine (NAMENDA XR) 14 MG CP24 24 hr capsule TAKE 1 CAPSULE DAILY FOR MEMORY Patient taking differently: Take 14 mg by mouth daily.  01/18/19  Yes [name], NP  metoprolol tartrate (LOPRESSOR) 50 MG tablet TAKE 1 TABLET TWICE A DAY Patient taking differently: Take 50 mg by mouth 2 (two) times daily.  09/22/18  Yes [name], NP  mirtazapine (REMERON) 7.5 MG tablet Take 7.5 mg by mouth at bedtime.   Yes [provider]  Multiple Vitamin (MULTIVITAMIN WITH MINERALS) TABS tablet Take 2 tablets by mouth  daily.    Yes [provider]  Nutritional Supplements (ENSURE PO) Take 1 each by mouth in the morning and at bedtime.   Yes [provider]  sertraline (ZOLOFT) 25 MG tablet Take 1 tablet (25 mg total) by mouth daily. 06/24/19  Yes [name], NP  Vitamin D, Ergocalciferol, (DRISDOL) 1.25 MG (50000 UT) CAPS capsule TAKE 1 CAPSULE EVERY 7 DAYS Patient taking differently: Take 50,000 Units by mouth every 7 (seven) days.  02/08/19  Yes [name], NP    Allergies:   Allergies  Allergen Reactions  . Aricept [Donepezil Hcl]   . Aspirin     Upset stomach     Social History:  reports that she has never smoked. She has never used smokeless tobacco. She reports that she does not drink alcohol and does not use drugs.  Family History: Family History  Problem Relation Age of Onset  . Hypertension Mother   . Stroke Mother   . Stroke Father   . Lung cancer Daughter      Objective   Physical Exam: Blood pressure 122/65, pulse 70, temperature 98.4 F (36.9 C), temperature source Oral, resp. rate 19, height 5\' 6"  (1.676 m), weight 55 kg, SpO2 94 %.  Physical Exam Vitals and nursing note reviewed.  Constitutional:      Appearance: Normal appearance.  HENT:     Head: Normocephalic and atraumatic.  Eyes:     Conjunctiva/sclera: Conjunctivae normal.  Cardiovascular:     Rate and Rhythm: Normal rate and regular rhythm.  Pulmonary:     Effort: Pulmonary effort is normal.     Breath sounds: Normal breath sounds.     Comments: SPO2 dropped to 88 on room air Dry Cough Abdominal:     General: Abdomen is flat.     Palpations: Abdomen is soft.  Musculoskeletal:        General: No swelling or tenderness.  Skin:    Coloration: Skin is not jaundiced or pale.  Neurological:     Mental Status: She is alert. Mental status is at baseline.  Psychiatric:        Mood and Affect: Mood normal.        Behavior: Behavior normal.     LABS on Admission: I have  personally reviewed all the labs and imaging below    Basic Metabolic Panel: Recent Labs  Lab 12/01/19 1231  NA 145  K 4.0  CL 105  CO2 28  GLUCOSE 118*  BUN 27*  CREATININE 1.43*  CALCIUM 8.4*   Liver Function Tests: Recent Labs  Lab 12/01/19 1231  AST  19  ALT 10  ALKPHOS 63  BILITOT 0.7  PROT 5.7*  ALBUMIN 2.8*   No results for input(s): LIPASE, AMYLASE in the last 168 hours. No results for input(s): AMMONIA in the last 168 hours. CBC: Recent Labs  Lab 12/01/19 1231  WBC 8.0  NEUTROABS 5.6  HGB 10.5*  HCT 32.8*  MCV 94.8  PLT 156   Cardiac Enzymes: No results for input(s): CKTOTAL, CKMB, CKMBINDEX, TROPONINI in the last 168 hours. BNP: Invalid input(s): POCBNP CBG: No results for input(s): GLUCAP in the last 168 hours.  Radiological Exams on Admission:  DG Chest Port 1 View  Result Date: 12/01/2019 CLINICAL DATA:  Cough, hypoxia. EXAM: PORTABLE CHEST 1 VIEW COMPARISON:  November 22, 2009. FINDINGS: Stable cardiomediastinal silhouette. Interval development left upper and lower lobe opacities concerning for pneumonia. Probable small left pleural effusion may be present. Minimal right basilar subsegmental atelectasis is noted. No pneumothorax is noted. Bony thorax is unremarkable. IMPRESSION: Interval development of left upper and lower lobe opacities concerning for pneumonia. Probable small left pleural effusion may be present. Minimal right basilar subsegmental atelectasis is noted. Electronically Signed   By: Marijo Conception M.D.   On: 12/01/2019 12:54      EKG: Ordered but not done   A & P   Principal Problem:   Respiratory failure (HCC) Active Problems:   Hypertension   Hypotension   1. Acute hypoxic respiratory failure of unknown etiology a. Afebrile and hemodynamically stable however requiring at least 2 L with a dry cough b. CXR concerning for pneumonia but procalcitonin is negative and no leukocytosis and she has already completed  azithromycin and Levaquin over the past 1 to 2 weeks at her facility c. COVID-19 negative d. Incentive spirometry for atelectasis e. Will check a viral panel  2. Hypotension with history of hypertension a. Hypotension resolved with NS bolus b. Hold home antihypertensives for now c. As needed metoprolol  3. Advanced dementia a. Continue home meds b. PT eval    DVT prophylaxis: lovenox   Code Status: Full Code   Family Communication: Admission, patients condition and plan of care including tests being ordered have been discussed with the patient who indicates understanding and agrees with the plan and Code Status. Patient's daughter was updated  Disposition Plan: The appropriate patient status for this patient is OBSERVATION. Observation status is judged to be reasonable and necessary in order to provide the required intensity of service to ensure the patient's safety. The patient's presenting symptoms, physical exam findings, and initial radiographic and laboratory data in the context of their medical condition is felt to place them at decreased risk for further clinical deterioration. Furthermore, it is anticipated that the patient will be medically stable for discharge from the hospital within 2 midnights of admission. The following factors support the patient status of observation.   " The patient's presenting symptoms include cough, hypoxia, hypotension. " The physical exam findings include hypoxia, cough. " The initial radiographic and laboratory data are possible pneumonia on CXR.    Status is: Observation  The patient remains OBS appropriate and will d/c before 2 midnights.  Dispo: The patient is from: ALF              Anticipated d/c is to: ALF              Anticipated d/c date is: 1 day              Patient currently is not medically stable  to d/c.    Consultants  . none  Procedures  . none  Time Spent on Admission: 63 minutes    Harold Hedge, DO [facility] Pager [phone] 12/01/2019, 6:11 PM

## 2019-12-01 NOTE — ED Triage Notes (Signed)
Patient states "this is pointless" giving little to no answers

## 2019-12-01 NOTE — ED Notes (Signed)
X-ray at bedside

## 2019-12-01 NOTE — ED Provider Notes (Signed)
[facility] DEPT Provider Note   CSN: [id number] Arrival date & time: 12/01/19  1202     History Chief Complaint  Patient presents with  . Hypotension    Lauren [name] is a 86 y.o. female.  The history is provided by the patient, the EMS personnel and medical records. No language interpreter was used.   Lauren [name] is a 86 y.o. female who presents to the Emergency Department complaining of abnormal vital signs. Level V caveat due to dementia. History is provided by EMS. EMS reports that the [facility]. Per report they checked vital signs once a month and today they checked routine vital signs and she was found to have a blood pressure of 94/48 and a pulse ox of 86%. There is a report of a cough but unclear duration. According to her Lauren [name] she was recently treated with azithromycin, which was discontinued on September 2 and also treated with a course of Levaquin. Patient denies any complaints.    Past Medical History:  Diagnosis Date  . Anxiety   . Cancer ([place]) 12/01/2018   skin cancer on her head(L)  . High cholesterol   . Hypertension   . Osteoporosis   . Rosacea   . Thyroid disease   . Weight loss     Patient Active Problem List   Diagnosis Date Noted  . CKD (chronic kidney disease) stage 4, GFR 15-29 ml/min (HCC) 09/01/2017  . Osteoporosis 10/21/2016  . Weight gain 04/16/2016  . Hypocalcemia 04/16/2016  . Hypothyroidism 04/16/2016  . Edema of right foot 04/16/2016  . Dementia ([place]) 10/31/2015  . 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 Loss of weight 03/24/2014  . Hypertension   . High cholesterol   . Anxiety     Past Surgical History:  Procedure Laterality Date  . CHOLECYSTECTOMY  1959   [place]  . THYROIDECTOMY  2003   [place]  . TONSILLECTOMY AND ADENOIDECTOMY       OB History   No obstetric history on file.     Family History  Problem Relation Age of Onset  .  Hypertension Mother   . Stroke Mother   . Stroke Father   . Lung cancer Daughter     Social History   Tobacco Use  . Smoking status: Never Smoker  . Smokeless tobacco: Never Used  Vaping Use  . Vaping Use: Never used  Substance Use Topics  . Alcohol use: No    Alcohol/week: 0.0 standard drinks  . Drug use: No    Home Medications Prior to Admission medications   Medication Sig Start Date End Date Taking? Authorizing Provider  amLODipine (NORVASC) 5 MG tablet Take 5 mg by mouth daily.   Yes [provider]  atorvastatin (LIPITOR) 10 MG tablet Take 0.5 tablets (5 mg total) by mouth daily. 01/19/19  Yes [name], NP  buPROPion (WELLBUTRIN XL) 150 MG 24 hr tablet TAKE 1 TABLET DAILY IN THE MORNING FOR ANXIETY Patient taking differently: Take 150 mg by mouth daily.  06/03/19  Yes [name], NP  hydrALAZINE (APRESOLINE) 25 MG tablet TAKE 1 TABLET TWICE A DAY Patient taking differently: Take 25 mg by mouth in the morning and at bedtime.  03/02/19  Yes [name], NP  levothyroxine (SYNTHROID) 88 MCG tablet Take 88 mcg by mouth daily. 08/20/19  Yes [provider]  losartan (COZAAR) 100 MG tablet TAKE 1 TABLET DAILY Patient taking differently: Take 100 mg  by mouth daily.  07/17/18  Yes [name], NP  memantine (NAMENDA XR) 14 MG CP24 24 hr capsule TAKE 1 CAPSULE DAILY FOR MEMORY Patient taking differently: Take 14 mg by mouth daily.  01/18/19  Yes [name], NP  metoprolol tartrate (LOPRESSOR) 50 MG tablet TAKE 1 TABLET TWICE A DAY Patient taking differently: Take 50 mg by mouth 2 (two) times daily.  09/22/18  Yes [name], NP  mirtazapine (REMERON) 7.5 MG tablet Take 7.5 mg by mouth at bedtime.   Yes [provider]  Multiple Vitamin (MULTIVITAMIN WITH MINERALS) TABS tablet Take 2 tablets by mouth daily.    Yes [provider]  Nutritional Supplements (ENSURE PO) Take 1 each by mouth in the morning and at  bedtime.   Yes [provider]  sertraline (ZOLOFT) 25 MG tablet Take 1 tablet (25 mg total) by mouth daily. 06/24/19  Yes [name], NP  Vitamin D, Ergocalciferol, (DRISDOL) 1.25 MG (50000 UT) CAPS capsule TAKE 1 CAPSULE EVERY 7 DAYS Patient taking differently: Take 50,000 Units by mouth every 7 (seven) days.  02/08/19  Yes [name], NP    Allergies    Aricept Reather [place] hcl] and Aspirin  Review of Systems   Review of Systems  All other systems reviewed and are negative.   Physical Exam Updated Vital Signs BP (!) 120/50   Pulse 73   Temp 98.4 F (36.9 C) (Oral)   Resp (!) 21   Ht 5\' 6"  (1.676 m)   Wt 55 kg   SpO2 90%   BMI 19.57 kg/m   Physical Exam Vitals and nursing note reviewed.  Constitutional:      Appearance: She is well-developed.  HENT:     Head: Normocephalic and atraumatic.  Cardiovascular:     Rate and Rhythm: Normal rate and regular rhythm.     Heart sounds: No murmur heard.   Pulmonary:     Effort: Pulmonary effort is normal. No respiratory distress.     Breath sounds: Normal breath sounds.  Abdominal:     Palpations: Abdomen is soft.     Tenderness: There is no abdominal tenderness. There is no guarding or rebound.  Musculoskeletal:        General: No tenderness.  Skin:    General: Skin is warm and dry.  Neurological:     Mental Status: She is alert.     Comments: Oriented to person and place, disoriented to time in recent events. Moves all extremities symmetrically.  Psychiatric:     Comments: Mildly agitated but redirectable     ED Results / Procedures / Treatments   Labs (all labs ordered are listed, but only abnormal results are displayed) Labs Reviewed  COMPREHENSIVE METABOLIC PANEL - Abnormal; Notable for the following components:      Result Value   Glucose, Bld 118 (*)    BUN 27 (*)    Creatinine, Ser 1.43 (*)    Calcium 8.4 (*)    Total Protein 5.7 (*)    Albumin 2.8 (*)    GFR calc non Af Amer 31  (*)    GFR calc Af Amer 36 (*)    All other components within normal limits  CBC WITH DIFFERENTIAL/PLATELET - Abnormal; Notable for the following components:   RBC 3.46 (*)    Hemoglobin 10.5 (*)    HCT 32.8 (*)    Monocytes Absolute 1.1 (*)    All other components within normal limits  SARS CORONAVIRUS 2 BY RT PCR (HOSPITAL ORDER, [facility])    EKG None  Radiology DG Chest Port 1 View  Result Date: 12/01/2019 CLINICAL DATA:  Cough, hypoxia. EXAM: PORTABLE CHEST 1 VIEW COMPARISON:  November 22, 2009. FINDINGS: Stable cardiomediastinal silhouette. Interval development left upper and lower lobe opacities concerning for pneumonia. Probable small left pleural effusion may be present. Minimal right basilar subsegmental atelectasis is noted. No pneumothorax is noted. Bony thorax is unremarkable. IMPRESSION: Interval development of left upper and lower lobe opacities concerning for pneumonia. Probable small left pleural effusion may be present. Minimal right basilar subsegmental atelectasis is noted. Electronically Signed   By: Marijo Conception M.D.   On: 12/01/2019 12:54    Procedures Procedures (including critical care time)  Medications Ordered in ED Medications  vancomycin (VANCOREADY) IVPB 1250 mg/250 mL (1,250 mg Intravenous New Bag/Given 12/01/19 1415)  ceFEPIme (MAXIPIME) 2 g in sodium chloride 0.9 % 100 mL IVPB (0 g Intravenous Stopped 12/01/19 1414)  sodium chloride 0.9 % bolus 500 mL (500 mLs Intravenous New Bag/Given 12/01/19 1442)    ED Course  I have reviewed the triage vital signs and the nursing notes.  Pertinent labs & imaging results that were available during my care of the patient were reviewed by me and considered in my medical decision making (see chart for details).    MDM Rules/Calculators/A&P                         Patient here from memory care for abnormal vital signs. Patient is without acute complaints but history and examination is  limited secondary to her underlying dementia and poor cooperation. She has recently been treated with antibiotics for pneumonia. Chest x-ray consistent with multifocal pneumonia and she was started on broad-spectrum antibiotics. Discussed with patient and daughter findings of studies and recommendation for admission and they are in agreement with plan. Hospitalist consulted for admission.  Final Clinical Impression(s) / ED Diagnoses Final diagnoses:  Multifocal pneumonia    Rx / DC Orders ED Discharge Orders    None       Quintella Reichert, MD 12/01/19 1459

## 2019-12-01 NOTE — ED Notes (Signed)
Message sent to pharmacy to verify meds. 

## 2019-12-02 ENCOUNTER — Observation Stay (HOSPITAL_COMMUNITY): Payer: Medicare HMO

## 2019-12-02 DIAGNOSIS — Z20822 Contact with and (suspected) exposure to covid-19: Secondary | ICD-10-CM | POA: Diagnosis present

## 2019-12-02 DIAGNOSIS — E890 Postprocedural hypothyroidism: Secondary | ICD-10-CM | POA: Diagnosis present

## 2019-12-02 DIAGNOSIS — D631 Anemia in chronic kidney disease: Secondary | ICD-10-CM | POA: Diagnosis present

## 2019-12-02 DIAGNOSIS — Z681 Body mass index (BMI) 19 or less, adult: Secondary | ICD-10-CM | POA: Diagnosis not present

## 2019-12-02 DIAGNOSIS — I471 Supraventricular tachycardia: Secondary | ICD-10-CM | POA: Diagnosis not present

## 2019-12-02 DIAGNOSIS — Z66 Do not resuscitate: Secondary | ICD-10-CM | POA: Diagnosis present

## 2019-12-02 DIAGNOSIS — J181 Lobar pneumonia, unspecified organism: Secondary | ICD-10-CM | POA: Diagnosis present

## 2019-12-02 DIAGNOSIS — Z8249 Family history of ischemic heart disease and other diseases of the circulatory system: Secondary | ICD-10-CM | POA: Diagnosis not present

## 2019-12-02 DIAGNOSIS — E7800 Pure hypercholesterolemia, unspecified: Secondary | ICD-10-CM | POA: Diagnosis present

## 2019-12-02 DIAGNOSIS — J189 Pneumonia, unspecified organism: Secondary | ICD-10-CM | POA: Diagnosis present

## 2019-12-02 DIAGNOSIS — N179 Acute kidney failure, unspecified: Secondary | ICD-10-CM | POA: Diagnosis not present

## 2019-12-02 DIAGNOSIS — Z85828 Personal history of other malignant neoplasm of skin: Secondary | ICD-10-CM | POA: Diagnosis not present

## 2019-12-02 DIAGNOSIS — D696 Thrombocytopenia, unspecified: Secondary | ICD-10-CM

## 2019-12-02 DIAGNOSIS — R627 Adult failure to thrive: Secondary | ICD-10-CM

## 2019-12-02 DIAGNOSIS — I959 Hypotension, unspecified: Secondary | ICD-10-CM | POA: Diagnosis present

## 2019-12-02 DIAGNOSIS — F0390 Unspecified dementia without behavioral disturbance: Secondary | ICD-10-CM | POA: Diagnosis present

## 2019-12-02 DIAGNOSIS — Z888 Allergy status to other drugs, medicaments and biological substances status: Secondary | ICD-10-CM | POA: Diagnosis not present

## 2019-12-02 DIAGNOSIS — E876 Hypokalemia: Secondary | ICD-10-CM | POA: Diagnosis not present

## 2019-12-02 DIAGNOSIS — F419 Anxiety disorder, unspecified: Secondary | ICD-10-CM | POA: Diagnosis present

## 2019-12-02 DIAGNOSIS — N1832 Chronic kidney disease, stage 3b: Secondary | ICD-10-CM | POA: Diagnosis present

## 2019-12-02 DIAGNOSIS — M810 Age-related osteoporosis without current pathological fracture: Secondary | ICD-10-CM | POA: Diagnosis present

## 2019-12-02 DIAGNOSIS — D649 Anemia, unspecified: Secondary | ICD-10-CM

## 2019-12-02 DIAGNOSIS — I129 Hypertensive chronic kidney disease with stage 1 through stage 4 chronic kidney disease, or unspecified chronic kidney disease: Secondary | ICD-10-CM | POA: Diagnosis present

## 2019-12-02 DIAGNOSIS — Z886 Allergy status to analgesic agent status: Secondary | ICD-10-CM | POA: Diagnosis not present

## 2019-12-02 DIAGNOSIS — Z79899 Other long term (current) drug therapy: Secondary | ICD-10-CM | POA: Diagnosis not present

## 2019-12-02 DIAGNOSIS — Z79890 Hormone replacement therapy (postmenopausal): Secondary | ICD-10-CM | POA: Diagnosis not present

## 2019-12-02 DIAGNOSIS — J9601 Acute respiratory failure with hypoxia: Secondary | ICD-10-CM | POA: Diagnosis present

## 2019-12-02 LAB — RESPIRATORY PANEL BY PCR

## 2019-12-02 LAB — BASIC METABOLIC PANEL
Anion gap: 11 (ref 5–15)
BUN: 27 mg/dL — ABNORMAL HIGH (ref 8–23)
CO2: 24 mmol/L (ref 22–32)
Calcium: 8.1 mg/dL — ABNORMAL LOW (ref 8.9–10.3)
Chloride: 106 mmol/L (ref 98–111)
Creatinine, Ser: 1.38 mg/dL — ABNORMAL HIGH (ref 0.44–1.00)
GFR calc Af Amer: 38 mL/min — ABNORMAL LOW (ref >60)
GFR calc non Af Amer: 33 mL/min — ABNORMAL LOW (ref >60)
Glucose, Bld: 128 mg/dL — ABNORMAL HIGH (ref 70–99)
Potassium: 3.9 mmol/L (ref 3.5–5.1)
Sodium: 141 mmol/L (ref 135–145)

## 2019-12-02 LAB — CBC
HCT: 32.6 % — ABNORMAL LOW (ref 36.0–46.0)
Hemoglobin: 10.4 g/dL — ABNORMAL LOW (ref 12.0–15.0)
MCH: 30.4 pg (ref 26.0–34.0)
MCHC: 31.9 g/dL (ref 30.0–36.0)
MCV: 95.3 fL (ref 80.0–100.0)
Platelets: 145 10*3/uL — ABNORMAL LOW (ref 150–400)
RBC: 3.42 MIL/uL — ABNORMAL LOW (ref 3.87–5.11)
RDW: 14.7 % (ref 11.5–15.5)
WBC: 8.4 10*3/uL (ref 4.0–10.5)
nRBC: 0 % (ref 0.0–0.2)

## 2019-12-02 LAB — MRSA PCR SCREENING: MRSA by PCR: NEGATIVE

## 2019-12-02 MED ORDER — SODIUM CHLORIDE 0.9 % IV SOLN
1.0000 g | INTRAVENOUS | Status: DC
  Administered 2019-12-02 – 2019-12-04 (×3): 1 g via INTRAVENOUS
  Filled 2019-12-02 (×3): qty 1

## 2019-12-02 MED ORDER — BENEPROTEIN PO POWD: 1.0000 | Freq: Three times a day (TID) | ORAL | Status: DC

## 2019-12-02 MED ORDER — GUAIFENESIN ER 600 MG PO TB12
600.0000 mg | ORAL_TABLET | Freq: Two times a day (BID) | ORAL | Status: DC
  Administered 2019-12-02 – 2019-12-04 (×5): 600 mg via ORAL
  Filled 2019-12-02 (×5): qty 1

## 2019-12-02 MED ORDER — RESOURCE INSTANT PROTEIN PO PWD PACKET
1.0000 | Freq: Three times a day (TID) | ORAL | Status: DC
  Administered 2019-12-03: 6 g via ORAL
  Filled 2019-12-02 (×6): qty 6

## 2019-12-02 MED ORDER — RESOURCE THICKENUP CLEAR PO POWD
ORAL | Status: DC | PRN
  Filled 2019-12-02: qty 125

## 2019-12-02 MED ORDER — AMOXICILLIN-POT CLAVULANATE 500-125 MG PO TABS
500.0000 mg | ORAL_TABLET | Freq: Two times a day (BID) | ORAL | Status: DC
  Administered 2019-12-02: 500 mg via ORAL
  Filled 2019-12-02: qty 1

## 2019-12-02 MED ORDER — METRONIDAZOLE IN NACL 5-0.79 MG/ML-% IV SOLN
500.0000 mg | Freq: Three times a day (TID) | INTRAVENOUS | Status: DC
  Administered 2019-12-02 – 2019-12-04 (×7): 500 mg via INTRAVENOUS
  Filled 2019-12-02 (×4): qty 100

## 2019-12-02 MED ORDER — MELATONIN 3 MG PO TABS
6.0000 mg | ORAL_TABLET | Freq: Every evening | ORAL | Status: DC | PRN
  Administered 2019-12-02 – 2019-12-03 (×2): 6 mg via ORAL
  Filled 2019-12-02 (×2): qty 2

## 2019-12-02 NOTE — Evaluation (Signed)
Clinical/Bedside Swallow Evaluation Patient Details  Name: Lauren [name] MRN: [id number] Date of Birth: 1924/08/21  Today's Date: 12/02/2019 Time: SLP Start Time (ACUTE ONLY): 0944 SLP Stop Time (ACUTE ONLY): 1025 SLP Time Calculation (min) (ACUTE ONLY): 41 min  Past Medical History:  Past Medical History:  Diagnosis Date  . Anxiety   . Cancer ([place]) 12/01/2018   skin cancer on her head(L)  . High cholesterol   . Hypertension   . Osteoporosis   . Rosacea   . Thyroid disease   . Weight loss    Past Surgical History:  Past Surgical History:  Procedure Laterality Date  . CHOLECYSTECTOMY  1959   [place]  . THYROIDECTOMY  2003   [place]  . TONSILLECTOMY AND ADENOIDECTOMY     HPI:  86 yo female adm to [facility] with respiratory difficulties. Imaging concerning for pna.  Pt with vascular dementia, thyroid disease, anxiety and depression. Swallow evaluation ordered.  She has not been in the hospital snice 2017 when admitted for a fall.   Assessment / Plan / Recommendation Clinical Impression  Pt presents with clinical indications of pharyngeal dysphagia with likely aspiration of thin liquids c/b coughing post=swallow with food retained in mouth but also without.  Suspect spillage into open airway before swallow.    Liquid that was between thin and nectar thickness was toleratated without coughing.  Pt uses liquids to orally transit foods and after verbal cue she did not cease this behavior thus recommend to modify liquid to nectar with meals.  Recommend thin water between meals after oral care.  Do not anticipate pt will need MBS as hopefully this issue will resolve as pt gets stronger and she has not had hospital admissions since 2017.     Informed pt of recommendations but uncertain to her comprehension ability given her dementia. She is able to self feed fortunately.   SLP Visit Diagnosis: Dysphagia, oropharyngeal phase (R13.12)    Aspiration Risk  Moderate aspiration risk    Diet  Recommendation Regular;Nectar-thick liquid (thin water between meals)   Liquid Administration via: Cup;Straw Medication Administration: Whole meds with puree Supervision: Patient able to self feed Compensations: Slow rate;Small sips/bites Postural Changes: Remain upright for at least 30 minutes after po intake;Seated upright at 90 degrees    Other  Recommendations Oral Care Recommendations: Oral care BID Other Recommendations: Order thickener from pharmacy   Follow up Recommendations None      Frequency and Duration min 1 x/week  1 week       Prognosis Prognosis for Safe Diet Advancement: Guarded Barriers to Reach Goals: Severity of deficits      Swallow Study   General Date of Onset: 12/02/19 HPI: 87 yo female adm to [facility] with respiratory difficulties. Imaging concerning for pna.  Pt with vascular dementia, thyroid disease, anxiety and depression. Swallow evaluation ordered.  She has not been in the hospital snice 2017 when admitted for a fall. Type of Study: Bedside Swallow Evaluation Previous Swallow Assessment: none in system Diet Prior to this Study: Regular;Thin liquids Temperature Spikes Noted: No Respiratory Status: Room air (was on 2 liters, but changed to room air by RN during session) History of Recent Intubation: No Behavior/Cognition: Alert;Cooperative;Pleasant mood Oral Cavity Assessment: Within Functional Limits Oral Care Completed by SLP: No Oral Cavity - Dentition: Other (Comment);Adequate natural dentition (poor condition) Vision: Functional for self-feeding Self-Feeding Abilities: Able to feed self Patient Positioning: Upright in bed Baseline Vocal Quality: Normal Volitional Cough: Strong Volitional Swallow: Unable to elicit  Oral/Motor/Sensory Function Overall Oral Motor/Sensory Function: Within functional limits   Ice Chips Ice chips: Not tested   Thin Liquid Thin Liquid: Impaired Presentation: Cup;Straw;Self Fed Pharyngeal  Phase Impairments:  Cough - Delayed;Cough - Immediate    Nectar Thick Nectar Thick Liquid: Not tested   Honey Thick Honey Thick Liquid: Not tested   Puree Puree: Within functional limits Presentation: Self Fed;Spoon   Solid     Solid: Within functional limits Presentation: Self Fed;Spoon      Lauren [name] 12/02/2019,11:29 AM   Lauren Lime, MS [facility] Office [phone]

## 2019-12-02 NOTE — Plan of Care (Signed)
Patient with very poor po intake, ate small amount of breakfast then none of lunch or dinner.  Refused to drink Ensure.  Very little fluid intake as well.  Daughter at bedside.

## 2019-12-02 NOTE — Progress Notes (Signed)
PROGRESS NOTE    Lauren [name]  HAL:[id number] DOB: 09/26/1924 DOA: 12/01/2019 PCP: Patient, No Pcp Per    Chief Complaint  Patient presents with  . Hypotension    Brief Narrative:  Patient is a poor historian due to her dementia.  History obtained from daughter at bedside and ED note  This is a 86 year old female with past medical history of dementia and hypertension presented to the ED today due to abnormal vital signs.  According to the prior note, patient's ALF checks vital signs once a month and checked her routine vitals today and she was found to have a blood pressure of 94/48 and a pulse ox of 86% prompting her to be sent to the ED.  Patient's daughter at bedside states the patient has been treated for pneumonia with antibiotics over the past 1 to 2 weeks due to an underlying cough and was apparently treated with azithromycin and Levaquin.  Currently the patient denies any complaints    ED Course: Afebrile and hemodynamically stable initially requiring 4 L/min O2.BP 95/57 at the lowest improved to 122/65.  At the bedside, I turned the supplemental O2 to room air and patient's SPO2 dropped from 97% to 88%, she was placed back on 2 L.  Labs overall unremarkable.  CXR concerning for pneumonia with left upper and left lower lobe opacities and probable small left pleural effusion, minimal right basilar subsegmental atelectasis noted.  She was given vancomycin and cefepime and 500 cc NS bolus.    Subjective:   She is pleasantly demented, she remains tachypneic respiration rate ranged from 21-25, she is on 2 L oxygen supplement O2 sat 91%, oxygen dropped to 87% while ambulating to the bathroom on room air She is noticed to have intermittent nonproductive cough,  She denies pain She is not a reliable historian due to dementia her blood pressure improved with holding all her blood pressure medication  Daughter at bedside  Assessment & Plan:   Principal Problem:   Respiratory  failure ([place]) Active Problems:   Hypertension   Hypotension  Acute hypoxic respiratory failure/severe bilateral multifocal pneumonia failed outpatient treatment -O2 86% on room air, she is tachypneic respiration rate 25-27 on presentation,  -Daughter reports patient has been having ongoing cough for more than a month, was treated for pneumonia, patient has 5 pounds weight loss in the last month, appear getting progressively weaker, she used to walk with a cane, now walks with a walker. 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 Probable small left pleural effusion may be present. Minimal right basilar subsegmental atelectasis is noted." -Will get ct chest -MRSA screening negative, respiratory viral panel negative, SARS-CoV-2 screening negative, procalcitonin less than 0.1 -Per chart review QuantiFeron test was negative in April 2021 -She was given vank x1 and cefepime x1 in the ED on September 15, no further antibiotic ordered on admission -requested swallow eval, for now continue antibiotic with Augmentin until further testing result -will check lactic acid and repeat procalcitonin in the morning  Addendum: Ct chest showed similar bilateral multifocal pneumonia, she failed outpatient oral antibiotic treatment, will change to IV antibiotics, case discussed with infectious disease Dr. Drucilla Schmidt who recommend Rocephin and Flagyl  Hypotension -Continue to hold all blood pressure medication  CKD 3B Creatinine at baseline level, renal  dosing meds  Normocytic anemia Appears new this year, hemoglobin ranged from 10.4-11.6 No overt blood loss  Mild thrombocytopenia appear started from December 2019 Platelet 145 today No sign of bleeding  Hypothyroidism continue  Synthroid  FTT with recent weight loss and poor oral intake Body mass index is 18.2 kg/m. Nutrition consult  Failure to thrive We will get PT eval  DVT prophylaxis: enoxaparin (LOVENOX) injection 30 mg Start: 12/01/19 1800   Code Status:DNR, verified with daughter over the phone Family Communication: Daughter Disposition:   Status is: Inpatient  Dispo: The patient is from: [name]              Anticipated d/c is to: Return to memory care,               Anticipated d/c date is: TBD              Patient currently tachypneic, require oxygen supplement, need CT chest  Consultants:   None  Procedures:   None  Antimicrobials:   Vanco and cefepime x1 Augmentin x1  on September 16 Rocephin/flagyl from 9/16     Objective: Vitals:   12/02/19 0056 12/02/19 0113 12/02/19 0207 12/02/19 0534  BP: 113/80  122/60 117/60  Pulse: 73  73 67  Resp: 20  18 15   Temp: 98.2 F (36.8 C)  98.4 F (36.9 C) 97.7 F (36.5 C)  TempSrc: Oral  Oral   SpO2: 93%  93% 96%  Weight:  54.3 kg    Height:  5\' 8"  (1.727 m)      Intake/Output Summary (Last 24 hours) at 12/02/2019 0644 Last data filed at 12/01/2019 1811 Gross per 24 hour  Intake 950 ml  Output --  Net 950 ml   Filed Weights   12/01/19 1300 12/02/19 0113  Weight: 55 kg 54.3 kg    Examination:  General exam: calm, NAD Respiratory system: Coarse breath sound, more on the left side, with some crackles, no wheezing, no rhonchi.  Respiration rate 24-28 , no accessory muscle use Cardiovascular system: S1 & S2 heard, ectopic beats. No JVD, no murmur, No pedal edema. Gastrointestinal system: Abdomen is nondistended, soft and nontender. No organomegaly or masses felt. Normal bowel sounds heard. Central nervous system: Alert and oriented to place and person, not to time. No focal neurological deficits. Extremities: Symmetric , generalized weakness, no edema Skin: No rashes, lesions or ulcers Psychiatry:  Pleasantly demented    Data Reviewed: I have personally reviewed following labs and imaging studies  CBC: Recent Labs  Lab 12/01/19 1231 12/01/19 1811 12/02/19 0525  WBC 8.0 9.2 8.4  NEUTROABS 5.6  --   --   HGB 10.5* 11.6* 10.4*  HCT 32.8* 36.0 32.6*  MCV 94.8 93.8 95.3  PLT 156 144* 145*    Basic Metabolic Panel: Recent Labs  Lab 12/01/19 1231 12/01/19 1811 12/02/19 0525  NA 145  --  141  K 4.0  --  3.9  CL 105  --  106  CO2 28  --  24  GLUCOSE 118*  --  128*  BUN 27*  --  27*  CREATININE 1.43* 1.35* 1.38*  CALCIUM 8.4*  --  8.1*    GFR: Estimated Creatinine Clearance: 21.8 mL/min (A) (by C-G formula based on SCr of 1.38 mg/dL (H)).  Liver Function Tests: Recent Labs  Lab 12/01/19 1231  AST 19  ALT 10  ALKPHOS 63  BILITOT 0.7  PROT 5.7*  ALBUMIN 2.8*  CBG: No results for input(s): GLUCAP in the last 168 hours.   Recent Results (from the past 240 hour(s))  SARS Coronavirus 2 by RT PCR (hospital order, performed in [facility] hospital lab) Nasopharyngeal Nasopharyngeal Swab     Status: None   Collection Time: 12/01/19 12:40 PM   Specimen: Nasopharyngeal Swab  Result Value Ref Range Status   SARS Coronavirus 2 NEGATIVE NEGATIVE Final    Comment: (NOTE) SARS-CoV-2 target nucleic acids are NOT DETECTED.  The SARS-CoV-2 RNA is generally detectable in upper and lower respiratory specimens during the acute phase of infection. The lowest concentration of SARS-CoV-2 viral copies this assay can detect is 250 copies / mL. A negative result does not preclude SARS-CoV-2 infection and should not be used as the sole basis for treatment or other patient management decisions.  A negative result may occur with improper specimen collection / handling, submission of specimen other than nasopharyngeal swab, presence of viral mutation(s) within the areas targeted by this assay, and inadequate number of viral copies (<250 copies / mL). A negative result must be  combined with clinical observations, patient history, and epidemiological information.  Fact Sheet for Patients:   StrictlyIdeas.no  Fact Sheet for Healthcare Providers: BankingDealers.co.za  This test is not yet approved or  cleared by the [place] FDA and has been authorized for detection and/or diagnosis of SARS-CoV-2 by FDA under an Emergency Use Authorization (EUA).  This EUA will remain in effect (meaning this test can be used) for the duration of the COVID-19 declaration under Section 564(b)(1) of the Act, 21 U.S.C. section 360bbb-3(b)(1), unless the authorization is terminated or revoked sooner.  Performed at [facility], [place] [address] [place], [place] [address]   Respiratory Panel by PCR     Status: None   Collection Time: 12/01/19  6:55 PM   Specimen: Nasopharyngeal Swab; Respiratory  Result Value Ref Range Status   Adenovirus NOT DETECTED NOT DETECTED Final   Coronavirus 229E NOT DETECTED NOT DETECTED Final    Comment: (NOTE) The Coronavirus on the Respiratory Panel, DOES NOT test for the novel  Coronavirus (2019 nCoV)    Coronavirus HKU1 NOT DETECTED NOT DETECTED Final   Coronavirus NL63 NOT DETECTED NOT DETECTED Final   Coronavirus OC43 NOT DETECTED NOT DETECTED Final   Metapneumovirus NOT DETECTED NOT DETECTED Final   Rhinovirus / Enterovirus NOT DETECTED NOT DETECTED Final   Influenza A NOT DETECTED NOT DETECTED Final   Influenza B NOT DETECTED NOT DETECTED Final   Parainfluenza Virus 1 NOT DETECTED NOT DETECTED Final   Parainfluenza Virus 2 NOT DETECTED NOT DETECTED Final   Parainfluenza Virus 3 NOT DETECTED NOT DETECTED Final   Parainfluenza Virus 4 NOT DETECTED NOT DETECTED Final   Respiratory Syncytial Virus NOT DETECTED NOT DETECTED Final   Bordetella pertussis NOT DETECTED NOT DETECTED Final   Chlamydophila pneumoniae NOT DETECTED NOT DETECTED Final   Mycoplasma pneumoniae NOT  DETECTED NOT DETECTED Final    Comment: Performed at [facility], [place]. [address], [place], [place] [address]  MRSA PCR Screening     Status: None   Collection Time: 12/02/19 12:30 AM   Specimen: Nasal Mucosa; Nasopharyngeal  Result Value Ref Range Status   MRSA by PCR NEGATIVE NEGATIVE Final    Comment:        The GeneXpert MRSA Assay (FDA approved for NASAL specimens only), is one component of a comprehensive MRSA colonization surveillance program. It is not intended to diagnose MRSA  infection nor to guide or monitor treatment for MRSA infections. Performed at [facility], [place] [address] [place],  [address]          Radiology Studies: [facility] Chest Port 1 View  Result Date: 12/01/2019 CLINICAL DATA:  Cough, hypoxia. EXAM: PORTABLE CHEST 1 VIEW COMPARISON:  November 22, 2009. FINDINGS: Stable cardiomediastinal silhouette. Interval development left upper and lower lobe opacities concerning for pneumonia. Probable small left pleural effusion may be present. Minimal right basilar subsegmental atelectasis is noted. No pneumothorax is noted. Bony thorax is unremarkable. IMPRESSION: Interval development of left upper and lower lobe opacities concerning for pneumonia. Probable small left pleural effusion may be present. Minimal right basilar subsegmental atelectasis is noted. Electronically Signed   By: Marijo Conception M.D.   On: 12/01/2019 12:54        Scheduled Meds: . atorvastatin  5 mg Oral Daily  . buPROPion  150 mg Oral Daily  . enoxaparin (LOVENOX) injection  30 mg Subcutaneous Q24H  . levothyroxine  88 mcg Oral Daily  . memantine  14 mg Oral Daily  . mirtazapine  7.5 mg Oral QHS  . sertraline  25 mg Oral Daily   Continuous Infusions:   LOS: 0 days   Time spent: 26mins Greater than 50% of this time was spent in counseling, explanation of diagnosis, planning of further management, and coordination of care.  I have personally reviewed and  interpreted on  12/02/2019 daily labs, imagings as discussed above under date review session and assessment and plans.  I reviewed all nursing notes, pharmacy notes,  vitals, pertinent old records  I have discussed plan of care as described above with RN , patient and family on 12/02/2019  Voice Recognition /Dragon dictation system was used to create this note, attempts have been made to correct errors. Please contact the author with questions and/or clarifications.   Florencia Reasons, MD PhD FACP [facility]  Available via Epic secure chat 7am-7pm for nonurgent issues Please page for urgent issues To page the attending provider between 7A-7P or the covering provider during after hours 7P-7A, please log into the web site [email or web address] and access using universal  password for that web site. If you do not have the password, please call the hospital operator.    12/02/2019, 6:44 AM

## 2019-12-02 NOTE — Progress Notes (Signed)
Respiratory panel negative, droplet precautions discontinued as per protocol.

## 2019-12-02 NOTE — Evaluation (Signed)
Physical Therapy Evaluation Patient Details Name: Lauren [name] MRN: [id number] DOB: 1924/06/08 Today's Date: 12/02/2019   History of Present Illness  86 year old female with past medical history of dementia and hypertension presented to the ED today due to abnormal vital signs.  According to the prior note, patient's ALF checks vital signs once a month and checked her routine vitals today and she was found to have a blood pressure of 94/48 and a pulse ox of 86% prompting her to be sent to the ED.  Pt admitted for Acute hypoxic respiratory failure of unknown etiology and hypotension  Clinical Impression  Pt admitted with above diagnosis.  Pt currently with functional limitations due to the deficits listed below (see PT Problem List). Pt will benefit from skilled PT to increase their independence and safety with mobility to allow discharge to the venue listed below.  Pt only agreeable with ambulating in room and assisted to recliner.  Pt with SpO2 82% on room air after using bathroom.  Pt from ALF and likely able to return, recommend HHPT if pt agreeable to participate, can defer to ALF.     Follow Up Recommendations Home health PT;Supervision for mobility/OOB    Equipment Recommendations  None recommended by PT    Recommendations for Other Services       Precautions / Restrictions Precautions Precautions: Fall Precaution Comments: monitor sats Restrictions Weight Bearing Restrictions: No      Mobility  Bed Mobility               General bed mobility comments: pt in bathroom on arrival  Transfers Overall transfer level: Needs assistance Equipment used: Rolling walker (2 wheeled) Transfers: Sit to/from Stand Sit to Stand: Min guard         General transfer comment: min/guard for safety  Ambulation/Gait Ambulation/Gait assistance: Min guard Gait Distance (Feet): 10 Feet Assistive device: Rolling walker (2 wheeled) Gait Pattern/deviations: Step-through  pattern;Decreased stride length     General Gait Details: pt using RW and then leaving RW behind, cues for use however pt not always cooperative, would only ambulate out of bathroom and over to recliner, SpO2 82% on room air (pt not on O2 on arrival) so reapplied 1L O2 and improved to 89% (notified nurse tech)  Stairs            Wheelchair Mobility    Modified Rankin (Stroke Patients Only)       Balance Overall balance assessment: Mild deficits observed, not formally tested                                           Pertinent Vitals/Pain Pain Assessment: No/denies pain    Home Living Family/patient expects to be discharged to:: Assisted living                 Additional Comments: memory care unit    Prior Function Level of Independence: Independent with assistive device(s)         Comments: ambulatory (?RW)     Hand Dominance        Extremity/Trunk Assessment        Lower Extremity Assessment Lower Extremity Assessment: Generalized weakness    Cervical / Trunk Assessment Cervical / Trunk Assessment: Kyphotic  Communication   Communication: No difficulties  Cognition Arousal/Alertness: Awake/alert Behavior During Therapy: Flat affect Overall Cognitive Status: Impaired/Different from baseline  General Comments: hx dementia, pt not responding to most questions, repeating: "Came in here walking and now I'm an invalid."      General Comments      Exercises     Assessment/Plan    PT Assessment Patient needs continued PT services  PT Problem List Decreased strength;Decreased mobility;Cardiopulmonary status limiting activity;Decreased knowledge of use of DME;Decreased balance;Decreased activity tolerance;Decreased safety awareness       PT Treatment Interventions DME instruction;Therapeutic exercise;Stair training;Functional mobility training;Therapeutic  activities;Patient/family education;Balance training;Gait training;Neuromuscular re-education    PT Goals (Current goals can be found in the Care Plan section)  Acute Rehab PT Goals PT Goal Formulation: Patient unable to participate in goal setting Time For Goal Achievement: 12/16/19 Potential to Achieve Goals: Fair    Frequency Min 2X/week   Barriers to discharge        Co-evaluation               AM-PAC PT "6 Clicks" Mobility  Outcome Measure Help needed turning from your back to your side while in a flat bed without using bedrails?: A Little Help needed moving from lying on your back to sitting on the side of a flat bed without using bedrails?: A Little Help needed moving to and from a bed to a chair (including a wheelchair)?: A Little Help needed standing up from a chair using your arms (e.g., wheelchair or bedside chair)?: A Little Help needed to walk in hospital room?: A Little Help needed climbing 3-5 steps with a railing? : A Lot 6 Click Score: 17    End of Session Equipment Utilized During Treatment: Oxygen Activity Tolerance: Other (comment) (self limiting, hx dementia) Patient left: in chair;with call bell/phone within reach;with chair alarm set Nurse Communication: Mobility status PT Visit Diagnosis: Difficulty in walking, not elsewhere classified (R26.2)    Time: 7282-0601 PT Time Calculation (min) (ACUTE ONLY): 16 min   Charges:   PT Evaluation $PT Eval Low Complexity: 1 Low        Lauren [name] PT, DPT Acute Rehabilitation Services Pager: [phone] Office: [phone]  York Ram E 12/02/2019, 12:30 PM

## 2019-12-03 LAB — CBC WITH DIFFERENTIAL/PLATELET
Abs Immature Granulocytes: 0.04 10*3/uL (ref 0.00–0.07)
Basophils Absolute: 0 10*3/uL (ref 0.0–0.1)
Basophils Relative: 0 %
Eosinophils Absolute: 0.5 10*3/uL (ref 0.0–0.5)
Eosinophils Relative: 8 %
HCT: 33.3 % — ABNORMAL LOW (ref 36.0–46.0)
Hemoglobin: 10.4 g/dL — ABNORMAL LOW (ref 12.0–15.0)
Immature Granulocytes: 1 %
Lymphocytes Relative: 17 %
Lymphs Abs: 1.1 10*3/uL (ref 0.7–4.0)
MCH: 29.8 pg (ref 26.0–34.0)
MCHC: 31.2 g/dL (ref 30.0–36.0)
MCV: 95.4 fL (ref 80.0–100.0)
Monocytes Absolute: 0.9 10*3/uL (ref 0.1–1.0)
Monocytes Relative: 14 %
Neutro Abs: 3.8 10*3/uL (ref 1.7–7.7)
Neutrophils Relative %: 60 %
Platelets: 159 10*3/uL (ref 150–400)
RBC: 3.49 MIL/uL — ABNORMAL LOW (ref 3.87–5.11)
RDW: 14.9 % (ref 11.5–15.5)
WBC: 6.3 10*3/uL (ref 4.0–10.5)
nRBC: 0 % (ref 0.0–0.2)

## 2019-12-03 LAB — RETICULOCYTES
Immature Retic Fract: 17.7 % — ABNORMAL HIGH (ref 2.3–15.9)
RBC.: 3.48 MIL/uL — ABNORMAL LOW (ref 3.87–5.11)
Retic Count, Absolute: 67.9 10*3/uL (ref 19.0–186.0)
Retic Ct Pct: 2 % (ref 0.4–3.1)

## 2019-12-03 LAB — BASIC METABOLIC PANEL
Anion gap: 9 (ref 5–15)
BUN: 19 mg/dL (ref 8–23)
CO2: 25 mmol/L (ref 22–32)
Calcium: 7.9 mg/dL — ABNORMAL LOW (ref 8.9–10.3)
Chloride: 106 mmol/L (ref 98–111)
Creatinine, Ser: 1.12 mg/dL — ABNORMAL HIGH (ref 0.44–1.00)
GFR calc Af Amer: 49 mL/min — ABNORMAL LOW (ref >60)
GFR calc non Af Amer: 42 mL/min — ABNORMAL LOW (ref >60)
Glucose, Bld: 91 mg/dL (ref 70–99)
Potassium: 3.5 mmol/L (ref 3.5–5.1)
Sodium: 140 mmol/L (ref 135–145)

## 2019-12-03 LAB — PROCALCITONIN: Procalcitonin: <0.1 ng/mL

## 2019-12-03 LAB — VITAMIN B12: Vitamin B-12: 590 pg/mL (ref 180–914)

## 2019-12-03 LAB — GLUCOSE, CAPILLARY: Glucose-Capillary: 89 mg/dL (ref 70–99)

## 2019-12-03 LAB — FOLATE: Folate: 36.9 ng/mL (ref >5.9)

## 2019-12-03 LAB — LACTIC ACID, PLASMA: Lactic Acid, Venous: 0.9 mmol/L (ref 0.5–1.9)

## 2019-12-03 MED ORDER — KATE FARMS STANDARD 1.4 PO LIQD
325.0000 mL | Freq: Once | ORAL | Status: AC
  Administered 2019-12-03: 325 mL via ORAL
  Filled 2019-12-03: qty 325

## 2019-12-03 MED ORDER — ADULT MULTIVITAMIN W/MINERALS CH
1.0000 | ORAL_TABLET | Freq: Every day | ORAL | Status: DC
  Administered 2019-12-03 – 2019-12-04 (×2): 1 via ORAL
  Filled 2019-12-03 (×2): qty 1

## 2019-12-03 MED ORDER — POTASSIUM CHLORIDE CRYS ER 20 MEQ PO TBCR
40.0000 meq | EXTENDED_RELEASE_TABLET | Freq: Once | ORAL | Status: AC
  Administered 2019-12-03: 40 meq via ORAL
  Filled 2019-12-03: qty 2

## 2019-12-03 NOTE — Progress Notes (Addendum)
Initial Nutrition Assessment  DOCUMENTATION CODES:   Underweight  INTERVENTION:  - will liberalize diet from Heart Healthy to Regular--confirmed with Dr. Erlinda Hong - will order 1 time dose of [employer] 1.4 po, which provides 455 kcal and 20 grams protein and if patient accepts supplement, will place full order.  - continue 1 scoop Beneprotein TID, each scoop provides 25 kcal and 6 grams protein.  NUTRITION DIAGNOSIS:   Increased nutrient needs related to acute illness as evidenced by estimated needs.  GOAL:   Patient will meet greater than or equal to 90% of their needs  MONITOR:   PO intake, Supplement acceptance, Labs, Weight trends  REASON FOR ASSESSMENT:   Malnutrition Screening Tool, Consult Assessment of nutrition requirement/status  ASSESSMENT:   86 year old female with medical history of dementia and HTN. She presented to the ED due to abnormal vital signs at ALF. In the ED, her daughter reported that patient has been treated with abx x1-2 weeks PTA due to PNA.  No intakes documented since admission. Patient resting in chair and intermittently sleeping and watching tv.   Able to talk with daughter at bedside; she provides all information. Patient is from ALF where she still remains a great deal of independence such as feeding herself. She had been walking with a can but more recently started using a cane instead. She does not like the food served at ALF and will often tell her daughter how unappetizing it is.   Daughter is very happy because RN told her that patient ate well for breakfast and lunch today and daughter states that patient is "a whole different person" today than she has been for several days (in a positive way).   A bottle of Ensure is on bedside table. When asked, patient's daughter states that patient has told her that supplement is too sweet and too thick. Talked with daughter about [employer] supplement and she is interested in one time dose before full  order placed.   Weight yesterday was 120 lb and the most recent weight PTA was on 05/31/19 when she weighed 127 lb. This indicates 7 lb weight loss (5.5% body weight) in the past 6 months; not significant for time frame.   Patient was seen by SLP this afternoon who recommended regular consistency foods and thin liquids.    Labs reviewed; Ca: 7.9 mg/dl, GFR: 42 ml/min. Medications reviewed; 88 mcg oral synthroid/day, 40 mEq Klor-Con x1 dose 9/17.     NUTRITION - FOCUSED PHYSICAL EXAM:  unable to complete at this time.   Diet Order:   Diet Order            Diet regular Room service appropriate? Yes; Fluid consistency: Thin  Diet effective now                 EDUCATION NEEDS:   No education needs have been identified at this time  Skin:  Skin Assessment: Reviewed RN Assessment  Last BM:  PTA/unknown  Height:   Ht Readings from Last 1 Encounters:  12/02/19 5\' 8"  (1.727 m)    Weight:   Wt Readings from Last 1 Encounters:  12/02/19 54.3 kg    Estimated Nutritional Needs:  Kcal:  1150-1400 kcal Protein:  55-65 grams Fluid:  >/= 1.5 L/day     Jarome Matin, MS, RD, LDN, CNSC Inpatient Clinical Dietitian RD pager # available in AMION  After hours/weekend pager # available in [facility]

## 2019-12-03 NOTE — Progress Notes (Signed)
  Speech Language Pathology Treatment: Dysphagia  Patient Details Name: Lauren [name] MRN: [id number] DOB: Oct 20, 1924 Today's Date: 12/03/2019 Time: 1829-9371 SLP Time Calculation (min) (ACUTE ONLY): 25 min  Assessment / Plan / Recommendation Clinical Impression  Today pt seen to assess po tolerance and readiness for dietary advancement, indication for instrumental swallow evaluation.  After SLP assisted with set up, pt self fed and took only smal boluses.  No overt coughing with intake while SLP was in the room. She was heard to cough x1 as SLP was returning to room, but she denied having issues with swallowing.  No increased work of breathing or coughing with exertion or intake observed.    Minimal po consumed due to pt's poor intake but observed her consuming fish bolus, mac n cheese x3, brocolli (she spat out as it was not cooked and cold), icecream, nectar thick tea and thin water.  Subtle throat clear noted x1 only.  Pt was also taxed with 3 ounce Yale water challenge and passed, thus asp risk is low.  Recommend to advance diet to allow thin liquids - prefer water with meals.   Informed pt of recommendations to which she stated agreement.    During session, pt repeatedly asked the same questions due to her dementia therefore she is a poor historian.    HPI HPI: 86 yo female adm to [facility] with respiratory difficulties. Imaging concerning for pna.  Pt with vascular dementia, thyroid disease, anxiety and depression. Swallow evaluation ordered.  She has not been in the hospital snice 2017 when admitted for a fall.      SLP Plan  Continue with current plan of care       Recommendations  Diet recommendations: Regular;Thin liquid Liquids provided via: Cup;Straw Medication Administration: Whole meds with puree Supervision: Patient able to self feed Compensations: Slow rate;Small sips/bites Postural Changes and/or Swallow Maneuvers: Seated upright 90 degrees;Upright 30-60 min after meal                 Oral Care Recommendations: Oral care BID Follow up Recommendations: None SLP Visit Diagnosis: Dysphagia, oropharyngeal phase (R13.12) Plan: Continue with current plan of care       GO                Macario Golds 12/03/2019, 12:56 PM  Kathleen Lime, MS [place] Office [phone]

## 2019-12-03 NOTE — Progress Notes (Signed)
PROGRESS NOTE    Lauren [name]  IWP:[id number] DOB: 08-Dec-1924 DOA: 12/01/2019 PCP: Patient, No Pcp Per    Chief Complaint  Patient presents with  . Hypotension    Brief Narrative:  Patient is a poor historian due to her dementia.  History obtained from daughter at bedside and ED note  This is a 86 year old female with past medical history of dementia and hypertension presented to the ED today due to abnormal vital signs.  According to the prior note, patient's ALF checks vital signs once a month and checked her routine vitals today and she was found to have a blood pressure of 94/48 and a pulse ox of 86% prompting her to be sent to the ED.  Patient's daughter at bedside states the patient has been treated for pneumonia with antibiotics over the past 1 to 2 weeks due to an underlying cough and was apparently treated with azithromycin and Levaquin.  Currently the patient denies any complaints    ED Course: Afebrile and hemodynamically stable initially requiring 4 L/min O2.BP 95/57 at the lowest improved to 122/65.  At the bedside, I turned the supplemental O2 to room air and patient's SPO2 dropped from 97% to 88%, she was placed back on 2 L.  Labs overall unremarkable.  CXR concerning for pneumonia with left upper and left lower lobe opacities and probable small left pleural effusion, minimal right basilar subsegmental atelectasis noted.  She was given vancomycin and cefepime and 500 cc NS bolus.    Subjective:   She is pleasantly demented, she remains tachypneic respiration rate ranged from 21-25, She is  89% at rest on room air,  I did not hear  Cough during encouter,   She denies pain She is not a reliable historian due to dementia her blood pressure improved with holding all her blood pressure medication, continue to hold bp meds   Assessment & Plan:   Principal Problem:   Respiratory failure ([place]) Active Problems:   Hypertension   Hypotension   Lobar pneumonia  ([place])  Acute hypoxic respiratory failure/severe bilateral multifocal pneumonia failed outpatient treatment -O2 86% on room air, she is tachypneic respiration rate 25-27 on presentation,  -Daughter reports patient has been having ongoing cough for more than a month, was treated for pneumonia, patient has 5 pounds weight loss in the last month, appear getting progressively weaker, she used to walk with a cane, now walks with a walker. - ct chest with severe bilateral pneumonia -MRSA screening negative, respiratory viral panel negative, SARS-CoV-2 screening negative, procalcitonin less than 0.1 -Per chart review QuantiFeron test was negative in April 2021 -ordered urine strep pneumo antigen and urine Legionella antigen test on September 16, urine was not collected, message sent to RN to request urine sample collection -She was given vank x1 and cefepime x1 in the ED on September 15, then one dose augmentin on 9/16 am -swallow eval with moderate aspiration risk, recommend regular and nectar-thick liquid -case discussed with infectious disease Dr. Drucilla Schmidt who recommend Rocephin and Flagyl which is started on September 16 -She appears slightly improved, plan to continue IV antibiotic for now, ambulate patient tomorrow to check O2 set, consider discharge home on Augmentin if clinically improves  Hypokalemia, replace K, check mag   CKD 3B Creatinine at baseline level, renal dosing meds  Normocytic anemia Appears new this year, hemoglobin ranged from 10.4-11.6 retic count inappropriately low B12/folate unremarkable No overt blood loss  Mild thrombocytopenia appear started from December 2019 Platelet 145 -159  No sign of bleeding  Hypothyroidism continue Synthroid  FTT with recent weight loss and poor oral intake Body mass index is 18.2 kg/m. Nutrition input appreciated  PT eval  Recommend home health  DVT prophylaxis: enoxaparin (LOVENOX) injection 30 mg Start: 12/01/19 1800   Code  Status:DNR, verified with daughter over the phone Family Communication: Daughter over the phone Disposition:   Status is: Inpatient  Dispo: The [facility] is from: [name]              Anticipated d/c is to: Return to memory care,               Anticipated d/c date is: 24-48hrs              Patient currently tachypneic, hypoxia   Consultants:   Physical therapy  Speech therapy  Nutrition/dietitian  Social worker  Procedures:   None  Antimicrobials:   Vanco and cefepime x1 Augmentin x1  on September 16 Rocephin/flagyl from 9/16     Objective: Vitals:   12/02/19 1128 12/02/19 1408 12/02/19 2205 12/03/19 0528  BP:  118/60 (!) 136/93 132/73  Pulse:  75 83 96  Resp:  (!) 23 20 20   Temp:  98.1 F (36.7 C) 98.1 F (36.7 C) 98.3 F (36.8 C)  TempSrc:  Oral Oral Oral  SpO2: (!) 87% 91% (!) 89% (!) 89%  Weight:      Height:       No intake or output data in the 24 hours ending 12/03/19 0824 Filed Weights   12/01/19 1300 12/02/19 0113  Weight: 55 kg 54.3 kg    Examination:  General exam: calm, NAD Respiratory system: Coarse breath sound, more on the left side, with some crackles, no wheezing, no rhonchi.  Respiration rate 24-28 , no accessory muscle use Cardiovascular system: S1 & S2 heard, ectopic beats. No JVD, no murmur, No pedal edema. Gastrointestinal system: Abdomen is nondistended, soft and nontender. No organomegaly or masses felt. Normal bowel sounds heard. Central nervous system: Alert and oriented to place and person, not to time. No focal neurological deficits. Extremities: Symmetric , generalized weakness, no edema Skin: No rashes, lesions or ulcers Psychiatry: Pleasantly demented    Data Reviewed: I have personally reviewed following labs and imaging studies  CBC: Recent Labs  Lab 12/01/19 1231 12/01/19 1811 12/02/19 0525 12/03/19 0502  WBC 8.0 9.2 8.4 6.3  NEUTROABS 5.6  --   --  3.8  HGB 10.5* 11.6* 10.4* 10.4*  HCT  32.8* 36.0 32.6* 33.3*  MCV 94.8 93.8 95.3 95.4  PLT 156 144* 145* 161    Basic Metabolic Panel: Recent Labs  Lab 12/01/19 1231 12/01/19 1811 12/02/19 0525 12/03/19 0502  NA 145  --  141 140  K 4.0  --  3.9 3.5  CL 105  --  106 106  CO2 28  --  24 25  GLUCOSE 118*  --  128* 91  BUN 27*  --  27* 19  CREATININE 1.43* 1.35* 1.38* 1.12*  CALCIUM 8.4*  --  8.1* 7.9*    GFR: Estimated Creatinine Clearance: 26.9 mL/min (A) (by C-G formula based on SCr of 1.12 mg/dL (H)).  Liver Function Tests: Recent Labs  Lab 12/01/19 1231  AST 19  ALT 10  ALKPHOS 63  BILITOT 0.7  PROT 5.7*  ALBUMIN 2.8*    CBG: No results for input(s): GLUCAP in the last 168 hours.   Recent Results (from the past 240 hour(s))  SARS  Coronavirus 2 by RT PCR (hospital order, performed in [facility] hospital lab) Nasopharyngeal Nasopharyngeal Swab     Status: None   Collection Time: 12/01/19 12:40 PM   Specimen: Nasopharyngeal Swab  Result Value Ref Range Status   SARS Coronavirus 2 NEGATIVE NEGATIVE Final    Comment: (NOTE) SARS-CoV-2 target nucleic acids are NOT DETECTED.  The SARS-CoV-2 RNA is generally detectable in upper and lower respiratory specimens during the acute phase of infection. The lowest concentration of SARS-CoV-2 viral copies this assay can detect is 250 copies / mL. A negative result does not preclude SARS-CoV-2 infection and should not be used as the sole basis for treatment or other patient management decisions.  A negative result may occur with improper specimen collection / handling, submission of specimen other than nasopharyngeal swab, presence of viral mutation(s) within the areas targeted by this assay, and inadequate number of viral copies (<250 copies / mL). A negative result must be combined with clinical observations, patient history, and epidemiological information.  Fact Sheet for Patients:   StrictlyIdeas.no  Fact Sheet for Healthcare  Providers: BankingDealers.co.za  This test is not yet approved or  cleared by the [place] FDA and has been authorized for detection and/or diagnosis of SARS-CoV-2 by FDA under an Emergency Use Authorization (EUA).  This EUA will remain in effect (meaning this test can be used) for the duration of the COVID-19 declaration under Section 564(b)(1) of the Act, 21 U.S.C. section 360bbb-3(b)(1), unless the authorization is terminated or revoked sooner.  Performed at [facility], [place] [address] [place], [place] [address]   Respiratory Panel by PCR     Status: None   Collection Time: 12/01/19  6:55 PM   Specimen: Nasopharyngeal Swab; Respiratory  Result Value Ref Range Status   Adenovirus NOT DETECTED NOT DETECTED Final   Coronavirus 229E NOT DETECTED NOT DETECTED Final    Comment: (NOTE) The Coronavirus on the Respiratory Panel, DOES NOT test for the novel  Coronavirus (2019 nCoV)    Coronavirus HKU1 NOT DETECTED NOT DETECTED Final   Coronavirus NL63 NOT DETECTED NOT DETECTED Final   Coronavirus OC43 NOT DETECTED NOT DETECTED Final   Metapneumovirus NOT DETECTED NOT DETECTED Final   Rhinovirus / Enterovirus NOT DETECTED NOT DETECTED Final   Influenza A NOT DETECTED NOT DETECTED Final   Influenza B NOT DETECTED NOT DETECTED Final   Parainfluenza Virus 1 NOT DETECTED NOT DETECTED Final   Parainfluenza Virus 2 NOT DETECTED NOT DETECTED Final   Parainfluenza Virus 3 NOT DETECTED NOT DETECTED Final   Parainfluenza Virus 4 NOT DETECTED NOT DETECTED Final   Respiratory Syncytial Virus NOT DETECTED NOT DETECTED Final   Bordetella pertussis NOT DETECTED NOT DETECTED Final   Chlamydophila pneumoniae NOT DETECTED NOT DETECTED Final   Mycoplasma pneumoniae NOT DETECTED NOT DETECTED Final    Comment: Performed at [facility], [place]. [address] [place], [place] [address]  MRSA PCR Screening     Status: None   Collection Time: 12/02/19 12:30 AM     Specimen: Nasal Mucosa; Nasopharyngeal  Result Value Ref Range Status   MRSA by PCR NEGATIVE NEGATIVE Final    Comment:        The GeneXpert MRSA Assay (FDA approved for NASAL specimens only), is one component of a comprehensive MRSA colonization surveillance program. It is not intended to diagnose MRSA infection nor to guide or monitor treatment for MRSA infections. Performed at [facility], [address] [place],  [place] [address]          Radiology Studies: CT CHEST WO CONTRAST  Result Date: 12/02/2019 CLINICAL DATA:  86 year old female with history of respiratory failure. EXAM: CT CHEST WITHOUT CONTRAST TECHNIQUE: Multidetector CT imaging of the chest was performed following the standard protocol without IV contrast. COMPARISON:  No priors. FINDINGS: Cardiovascular: Heart size is borderline enlarged. There is no significant pericardial fluid, thickening or pericardial calcification. There is aortic atherosclerosis, as well as atherosclerosis of the great vessels of the mediastinum and the coronary arteries, including calcified atherosclerotic plaque in the left anterior descending, left circumflex and right coronary arteries. Aneurysmal dilatation of the ascending thoracic aorta (4.7 cm in diameter). 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 Mediastinum/Nodes: No pathologically enlarged mediastinal or hilar lymph nodes. Please note that accurate exclusion of hilar adenopathy is limited on noncontrast CT scans. Esophagus is unremarkable in appearance. No axillary lymphadenopathy. Lungs/Pleura: Widespread areas of ground-glass attenuation, septal thickening, thickening of the peribronchovascular interstitium and regional areas of masslike airspace consolidation scattered randomly throughout the lungs bilaterally. Multiple air bronchograms within these consolidated regions. Small left pleural effusion lying dependently. Large  calcified pleural plaque in the anterior aspect of the left hemithorax incidentally noted. Upper Abdomen: Aortic atherosclerosis. Musculoskeletal: Chronic compression fracture of T10 vertebral body with 50% loss of anterior vertebral body height. There are no aggressive appearing lytic or blastic lesions noted in the visualized portions of the skeleton. IMPRESSION: 1. The appearance the chest is most compatible with severe multilobar bilateral pneumonia. 2. Small left parapneumonic pleural effusion lying dependently. 3. Calcified pleural plaque in the left hemithorax anteriorly, presumably related to remote infection or remote trauma. 4. Aortic atherosclerosis, in addition to 3 vessel coronary artery disease. 5. Aneurysmal dilatation of the ascending thoracic aorta (4.7 cm in diameter). Ascending thoracic aortic aneurysm. Recommend semi-annual imaging followup by CTA or MRA and referral to cardiothoracic surgery if not already obtained. This recommendation follows 2010 ACCF/AHA/AATS/ACR/ASA/SCA/SCAI/SIR/STS/SVM Guidelines for the Diagnosis and Management of Patients With Thoracic Aortic Disease. Circulation. 2010; 121: W263-Z858. Aortic aneurysm NOS (ICD10-I71.9). Aortic Atherosclerosis (ICD10-I70.0). Aortic aneurysm NOS (ICD10-I71.9). Electronically Signed   By: Vinnie Langton M.D.   On: 12/02/2019 13:50   DG Chest Port 1 View  Result Date: 12/01/2019 CLINICAL DATA:  Cough, hypoxia. EXAM: PORTABLE CHEST 1 VIEW COMPARISON:  November 22, 2009. FINDINGS: Stable cardiomediastinal silhouette. Interval development left upper and lower lobe opacities concerning for pneumonia. Probable small left pleural effusion may be present. Minimal right basilar subsegmental atelectasis is noted. No pneumothorax is noted. Bony thorax is unremarkable. IMPRESSION: Interval development of left upper and lower lobe opacities concerning for pneumonia. Probable small left pleural effusion may be present. Minimal right basilar  subsegmental atelectasis is noted. Electronically Signed   By: Marijo Conception M.D.   On: 12/01/2019 12:54        Scheduled Meds: . atorvastatin  5 mg Oral Daily  . buPROPion  150 mg Oral Daily  . enoxaparin (LOVENOX) injection  30 mg Subcutaneous Q24H  . guaiFENesin  600 mg Oral BID  . levothyroxine  88 mcg Oral Daily  . memantine  14 mg Oral Daily  . mirtazapine  7.5 mg Oral QHS  . potassium chloride  40 mEq Oral Once  . protein supplement  1 Scoop Oral TID WC  . sertraline  25 mg Oral Daily   Continuous Infusions: . cefTRIAXone (ROCEPHIN)  IV 1 g (12/02/19 1732)  . metronidazole 500 mg (12/03/19 8502)  LOS: 1 day   Time spent: 10mins Greater than 50% of this time was spent in counseling, explanation of diagnosis, planning of further management, and coordination of care.  I have personally reviewed and interpreted on  12/03/2019 daily labs, imagings as discussed above under date review session and assessment and plans.  I reviewed all nursing notes, pharmacy notes,  vitals, pertinent old records  I have discussed plan of care as described above with RN , patient and family on 12/03/2019  Voice Recognition /Dragon dictation system was used to create this note, attempts have been made to correct errors. Please contact the author with questions and/or clarifications.   Florencia Reasons, MD PhD FACP [facility]  Available via Epic secure chat 7am-7pm for nonurgent issues Please page for urgent issues To page the attending provider between 7A-7P or the covering provider during after hours 7P-7A, please log into the web site [email or web address] and access using universal [place] password for that web site. If you do not have the password, please call the hospital operator.    12/03/2019, 8:24 AM

## 2019-12-03 NOTE — Progress Notes (Signed)
Patient sat on toilet trying to urinate before bed patient stated she didn't have to go. Patient refused In and out cath at this time.

## 2019-12-04 DIAGNOSIS — I471 Supraventricular tachycardia: Secondary | ICD-10-CM

## 2019-12-04 DIAGNOSIS — E876 Hypokalemia: Secondary | ICD-10-CM

## 2019-12-04 DIAGNOSIS — F0150 Vascular dementia without behavioral disturbance: Secondary | ICD-10-CM

## 2019-12-04 LAB — MAGNESIUM: Magnesium: 1.7 mg/dL (ref 1.7–2.4)

## 2019-12-04 LAB — BASIC METABOLIC PANEL
Anion gap: 11 (ref 5–15)
BUN: 15 mg/dL (ref 8–23)
CO2: 25 mmol/L (ref 22–32)
Calcium: 8.2 mg/dL — ABNORMAL LOW (ref 8.9–10.3)
Chloride: 105 mmol/L (ref 98–111)
Creatinine, Ser: 1.07 mg/dL — ABNORMAL HIGH (ref 0.44–1.00)
GFR calc Af Amer: 52 mL/min — ABNORMAL LOW (ref >60)
GFR calc non Af Amer: 45 mL/min — ABNORMAL LOW (ref >60)
Glucose, Bld: 93 mg/dL (ref 70–99)
Potassium: 3.6 mmol/L (ref 3.5–5.1)
Sodium: 141 mmol/L (ref 135–145)

## 2019-12-04 LAB — CBC WITH DIFFERENTIAL/PLATELET
Abs Immature Granulocytes: 0.02 10*3/uL (ref 0.00–0.07)
Basophils Absolute: 0 10*3/uL (ref 0.0–0.1)
Basophils Relative: 0 %
Eosinophils Absolute: 0.5 10*3/uL (ref 0.0–0.5)
Eosinophils Relative: 9 %
HCT: 35.4 % — ABNORMAL LOW (ref 36.0–46.0)
Hemoglobin: 11.1 g/dL — ABNORMAL LOW (ref 12.0–15.0)
Immature Granulocytes: 0 %
Lymphocytes Relative: 16 %
Lymphs Abs: 0.9 10*3/uL (ref 0.7–4.0)
MCH: 29.9 pg (ref 26.0–34.0)
MCHC: 31.4 g/dL (ref 30.0–36.0)
MCV: 95.4 fL (ref 80.0–100.0)
Monocytes Absolute: 0.7 10*3/uL (ref 0.1–1.0)
Monocytes Relative: 13 %
Neutro Abs: 3.3 10*3/uL (ref 1.7–7.7)
Neutrophils Relative %: 62 %
Platelets: 161 10*3/uL (ref 150–400)
RBC: 3.71 MIL/uL — ABNORMAL LOW (ref 3.87–5.11)
RDW: 15.1 % (ref 11.5–15.5)
WBC: 5.4 10*3/uL (ref 4.0–10.5)
nRBC: 0 % (ref 0.0–0.2)

## 2019-12-04 LAB — STREP PNEUMONIAE URINARY ANTIGEN: Strep Pneumo Urinary Antigen: NEGATIVE

## 2019-12-04 LAB — PROCALCITONIN: Procalcitonin: <0.1 ng/mL

## 2019-12-04 MED ORDER — MAGNESIUM SULFATE 2 GM/50ML IV SOLN
2.0000 g | Freq: Once | INTRAVENOUS | Status: AC
  Administered 2019-12-04: 2 g via INTRAVENOUS
  Filled 2019-12-04: qty 50

## 2019-12-04 MED ORDER — KATE FARMS STANDARD 1.4 PO LIQD
325.0000 mL | Freq: Two times a day (BID) | ORAL | Status: DC
  Filled 2019-12-04 (×2): qty 325

## 2019-12-04 MED ORDER — METOPROLOL TARTRATE 25 MG PO TABS
12.5000 mg | ORAL_TABLET | Freq: Two times a day (BID) | ORAL | Status: DC
  Administered 2019-12-04: 12.5 mg via ORAL
  Filled 2019-12-04: qty 1

## 2019-12-04 MED ORDER — POTASSIUM CHLORIDE CRYS ER 20 MEQ PO TBCR
40.0000 meq | EXTENDED_RELEASE_TABLET | Freq: Once | ORAL | Status: AC
  Administered 2019-12-04: 40 meq via ORAL
  Filled 2019-12-04: qty 2

## 2019-12-04 MED ORDER — SACCHAROMYCES BOULARDII 250 MG PO PACK: 250.0000 mg | PACK | Freq: Two times a day (BID) | ORAL | 0 refills | Status: AC

## 2019-12-04 MED ORDER — GUAIFENESIN ER 600 MG PO TB12: 600.0000 mg | ORAL_TABLET | Freq: Two times a day (BID) | ORAL | 0 refills | Status: DC

## 2019-12-04 MED ORDER — AMOXICILLIN-POT CLAVULANATE 500-125 MG PO TABS: 1.0000 | ORAL_TABLET | Freq: Two times a day (BID) | ORAL | 0 refills | Status: AC

## 2019-12-04 MED ORDER — MIRTAZAPINE 15 MG PO TABS: 15.0000 mg | ORAL_TABLET | Freq: Every day | ORAL | 0 refills | Status: DC

## 2019-12-04 NOTE — Progress Notes (Signed)
SATURATION QUALIFICATIONS: (This note is used to comply with regulatory documentation for home oxygen)  Patient Saturations on Room Air at Rest = 93%  Patient Saturations on Room Air while Ambulating = 91%  Patient Saturations on n/a Liters of oxygen while Ambulating = n/a  Please briefly explain why patient needs home oxygen: patient does not require oxygen for home use.

## 2019-12-04 NOTE — TOC Transition Note (Addendum)
Transition of Care [facility]) - CM/SW Discharge Note   Patient Details  Name: Lauren [name] MRN: [id number] Date of Birth: 08/29/1924  [facility] CM/SW Contact:  Ross Ludwig, LCSW Phone Number: 12/04/2019, 2:05 PM   Clinical Narrative:     CSW spoke to [facility] ([place]) and they can accept patient back today.  Patient to be d/c'ed today to [facility].  Patient and family agreeable to plans will transport via ems.  Patient's daughter was made aware of planned discharge and is requesting EMS transport back to faciliy.  CSW updated Rollene Fare from [place] about HHPT orders for patient to be followed at ALF.  Physician also requested to have palliative follow patient at her ALF.  CSW contacted [facility] and spoke to [employer], she was made aware of the referral.  Final next level of care: Assisted Living (Memory Care) Barriers to Discharge: Barriers Resolved   Patient Goals and CMS Choice Patient states their goals for this hospitalization and ongoing recovery are:: To return back to [place] Medicare.gov Compare Post Acute Care list provided to:: Patient Represenative (must comment) Choice offered to / list presented to : Adult Children  Discharge Placement                Patient to be transferred to facility by: [facility] Name of family member notified: Patient's daughter Lauren [name] [phone] Patient and family notified of of transfer: 12/04/19  Discharge Plan and Services                  DME Agency: NA       HH Arranged: PT [place] Agency: Other - See comment Secondary school teacher) Date HH Agency Contacted: 12/04/19 Time [place]: [place] Representative spoke with at [place]: [place] ([place]) Interventions     Readmission Risk Interventions No flowsheet data found.

## 2019-12-04 NOTE — Progress Notes (Signed)
Urine has been collected and sent to lab.  

## 2019-12-04 NOTE — Discharge Summary (Signed)
Discharge Summary  Lauren [name] VFI:[id number] DOB: 03/13/1925  PCP: Patient, No Pcp Per  Admit date: 12/01/2019 Discharge date: 12/04/2019  Time spent: 66mins, more than 50% time spent on coordination of care.   Recommendations for Outpatient Follow-up:  1. F/u with [facility]  within a week  for hospital discharge follow up, repeat cbc/bmp at follow up. pcp to continue adjust bp meds 2. Home health PT 3. Palliative care to follow patient at [facility]  Discharge Diagnoses:  Active Hospital Problems   Diagnosis Date Noted  . Respiratory failure ([place]) 12/01/2019  . Lobar pneumonia ([place]) 12/02/2019  . Hypotension 12/01/2019  . Hypertension     Resolved Hospital Problems  No resolved problems to display.    Discharge Condition: stable  Diet recommendation: regular diet  Filed Weights   12/01/19 1300 12/02/19 0113  Weight: 55 kg 54.3 kg    History of present illness: (Per admitting MD Dr. Neysa Bonito)  Patient is a poor historian due to her dementia.  History obtained from daughter at bedside and ED note  This is a 86 year old female with past medical history of dementia and hypertension presented to the ED today due to abnormal vital signs.  According to the prior note, patient's ALF checks vital signs once a month and checked her routine vitals today and she was found to have a blood pressure of 94/48 and a pulse ox of 86% prompting her to be sent to the ED.  Patient's daughter at bedside states the patient has been treated for pneumonia with antibiotics over the past 1 to 2 weeks due to an underlying cough and was apparently treated with azithromycin and Levaquin.  Currently the patient denies any complaints    ED Course: Afebrile and hemodynamically stable initially requiring 4 L/min O2.BP 95/57 at the lowest improved to 122/65.  At the bedside, I turned the supplemental O2 to room air and patient's SPO2 dropped from 97% to 88%, she was placed back on 2 L.   Labs overall unremarkable.  CXR concerning for pneumonia with left upper and left lower lobe opacities and probable small left pleural effusion, minimal right basilar subsegmental atelectasis noted.  She was given vancomycin and cefepime and 500 cc NS bolus.  Hospital Course:  Principal Problem:   Respiratory failure ([place]) Active Problems:   Hypertension   Hypotension   Lobar pneumonia ([facility])    Acute hypoxic respiratory failure/severe bilateral multifocal pneumonia failed outpatient treatment -On presentation O2 sat 86% on room air, she is tachypneic respiration rate 25-27  -Daughter reports patient has been having ongoing cough for more than a month, was treated for pneumonia, patient has 5 pounds weight loss in the last month, patient appears getting progressively weaker, she used to walk with a cane, now walks with a walker. - ct chest with severe bilateral pneumonia -MRSA screening negative, respiratory viral panel negative, SARS-CoV-2 screening negative, procalcitonin less than 0.1, urine strep pneumo Ag negative -Per chart review QuantiFeron test was negative in April 2021 --She was given vanc x1 and cefepime x1 in the ED on September 15, then one dose augmentin on 9/16 am -swallow eval with moderate aspiration risk, recommend regular and nectar-thick liquid -case discussed with infectious disease Dr. Drucilla Schmidt who recommend Rocephin and Flagyl which is started on September 16 -She appears slowly improved, on day of discharge Patient Saturations on Room Air at Rest = 93%, Patient Saturations on Room Air while Ambulating = 91% -discharge home on Augmentin x6 days to  finish total of 10days abx treatment, f/u with pcp next week, pcp to repeat cxr in 3-4 weeks to ensure resolution of bilateral infiltrates.   HTN:  -She presented with hypotension, although home blood pressure medication held since admission -Her blood pressure gradually start to increase today blood pressure 130/75 -Resume  beta-blocker, continue hold the rest of antihypertensive tensive's including Norvasc, hydralazine, Cozaar -PCP to close monitor blood pressure continue titrate blood pressure medication  Frequent PACs and PVCs, short runs of SVT while off betablocker There is a concern for afib but the EKG has poor baseline, I have reviewed tele, she has frequent PACs and PVCs She does not appear to be symptomatic Keep potassium greater than 4, magnesium greater than  2 Resume beta-blocker Follow-up with PCP   Hypokalemia/hypomagnesemia, replaced and improved    AKI on CKD 3A/3B Bun /cr on presentation was 27/1.38 Bun/cr at discharge is 15/1.07 renal dosing meds pcp to monitor renal function  Normocytic anemia Appears new this year, hemoglobin ranged from 10.4-11.6 retic count inappropriately low, likely anemia of chronic disease B12/folate unremarkable No overt blood loss  Mild thrombocytopenia appear intermittent since December 2019 Platelet 145 -159-161   Hypothyroidism continue Synthroid  FTT with recent weight loss and poor oral intake, increase remeron from 7.5mg  to 15mg  qhs , continue nutrition supplement Body mass index is 18.2 kg/m. Nutrition input appreciated  PT eval  Recommend home health Recommend palliative care to follow the patient  DVT prophylaxis while in the hospital: enoxaparin (LOVENOX) injection 30 mg Start: 12/01/19 1800   Code Status:DNR, verified with daughter over the phone Family Communication: Daughter daily Disposition:   Status is: Inpatient  Dispo: The [name]  Anticipated d/c is to: Return to memory care with PT, pcp and palliative care to follow patient   Consultants:   Physical therapy  Speech therapy  Nutrition/dietitian  Social worker  Phone conversation with infectious disease Dr. Tommy Medal  Procedures:   None  Antimicrobials:   Vanco and cefepime  x1 Augmentin x1  on September 16 Rocephin/flagyl from 9/16  Discharge Exam: BP 131/75   Pulse 92   Temp 98.3 F (36.8 C) (Oral)   Resp 20   Ht 5\' 8"  (1.727 m)   Wt 54.3 kg   SpO2 94%   BMI 18.20 kg/m   General: NAD, pleasantly demented  Cardiovascular: rrr Respiratory: Improved aeration, no wheezing, no rales, no rhonchi  Discharge Instructions You were cared for by a hospitalist during your hospital stay. If you have any questions about your discharge medications or the care you received while you were in the hospital after you are discharged, you can call the unit and asked to speak with the hospitalist on call if the hospitalist that took care of you is not available. Once you are discharged, your primary care physician will handle any further medical issues. Please note that NO REFILLS for any discharge medications will be authorized once you are discharged, as it is imperative that you return to your primary care physician (or establish a relationship with a primary care physician if you do not have one) for your aftercare needs so that they can reassess your need for medications and monitor your lab values.  Discharge Instructions    Diet general   Complete by: As directed    Increase activity slowly   Complete by: As directed    Increase activity slowly   Complete by: As directed  Allergies as of 12/04/2019      Reactions   Aricept [donepezil Hcl]    Aspirin    Upset stomach       Medication List    STOP taking these medications   amLODipine 5 MG tablet Commonly known as: NORVASC   hydrALAZINE 25 MG tablet Commonly known as: APRESOLINE   losartan 100 MG tablet Commonly known as: COZAAR   sertraline 25 MG tablet Commonly known as: ZOLOFT   Vitamin D (Ergocalciferol) 1.25 MG (50000 UNIT) Caps capsule Commonly known as: DRISDOL     TAKE these medications   amoxicillin-clavulanate 500-125 MG tablet Commonly known as: Augmentin Take 1 tablet (500 mg  total) by mouth 2 (two) times daily for 6 days.   atorvastatin 10 MG tablet Commonly known as: LIPITOR Take 0.5 tablets (5 mg total) by mouth daily.   buPROPion 150 MG 24 hr tablet Commonly known as: WELLBUTRIN XL TAKE 1 TABLET DAILY IN THE MORNING FOR ANXIETY What changed: See the new instructions.   Calcium + Vitamin D3 600-400 MG-UNIT Tabs Generic drug: Calcium Carbonate-Vitamin D3 Take 1 tablet by mouth daily.   ENSURE PO Take 1 each by mouth in the morning and at bedtime.   guaiFENesin 600 MG 12 hr tablet Commonly known as: MUCINEX Take 1 tablet (600 mg total) by mouth 2 (two) times daily.   levothyroxine 88 MCG tablet Commonly known as: SYNTHROID Take 88 mcg by mouth daily.   memantine 14 MG Cp24 24 hr capsule Commonly known as: NAMENDA XR TAKE 1 CAPSULE DAILY FOR MEMORY What changed: See the new instructions.   metoprolol tartrate 50 MG tablet Commonly known as: LOPRESSOR TAKE 1 TABLET TWICE A DAY   mirtazapine 15 MG tablet Commonly known as: Remeron Take 1 tablet (15 mg total) by mouth at bedtime. What changed:   medication strength  how much to take   Saccharomyces boulardii 250 MG Pack Take 250 mg by mouth 2 (two) times daily for 10 days.      Allergies  Allergen Reactions  . Aricept [Donepezil Hcl]   . Aspirin     Upset stomach     Follow-up Information    f[facility].   Why: pcp to repeat cbc/bmp at hospital discharge follow up pcp to continue adjust and titrate blood pressure meds consider palliative care referral if patient does not improve.                The results of significant diagnostics from this hospitalization (including imaging, microbiology, ancillary and laboratory) are listed below for reference.    Significant Diagnostic Studies: CT CHEST WO CONTRAST  Result Date: 12/02/2019 CLINICAL DATA:  86 year old female with history of respiratory failure. EXAM: CT CHEST WITHOUT CONTRAST  TECHNIQUE: Multidetector CT imaging of the chest was performed following the standard protocol without IV contrast. COMPARISON:  No priors. FINDINGS: Cardiovascular: Heart size is borderline enlarged. There is no significant pericardial fluid, thickening or pericardial calcification. There is aortic atherosclerosis, as well as atherosclerosis of the great vessels of the mediastinum and the coronary arteries, including calcified atherosclerotic plaque in the left anterior descending, left circumflex and right coronary arteries. Aneurysmal dilatation of the ascending thoracic aorta (4.7 cm in diameter). 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 Mediastinum/Nodes: No pathologically enlarged mediastinal or hilar lymph nodes. Please note that accurate exclusion of hilar adenopathy is limited on noncontrast CT scans. Esophagus is unremarkable in appearance. No axillary lymphadenopathy. Lungs/Pleura:  Widespread areas of ground-glass attenuation, septal thickening, thickening of the peribronchovascular interstitium and regional areas of masslike airspace consolidation scattered randomly throughout the lungs bilaterally. Multiple air bronchograms within these consolidated regions. Small left pleural effusion lying dependently. Large calcified pleural plaque in the anterior aspect of the left hemithorax incidentally noted. Upper Abdomen: Aortic atherosclerosis. Musculoskeletal: Chronic compression fracture of T10 vertebral body with 50% loss of anterior vertebral body height. There are no aggressive appearing lytic or blastic lesions noted in the visualized portions of the skeleton. IMPRESSION: 1. The appearance the chest is most compatible with severe multilobar bilateral pneumonia. 2. Small left parapneumonic pleural effusion lying dependently. 3. Calcified pleural plaque in the left hemithorax anteriorly, presumably related to remote infection or remote trauma. 4. Aortic  atherosclerosis, in addition to 3 vessel coronary artery disease. 5. Aneurysmal dilatation of the ascending thoracic aorta (4.7 cm in diameter). Ascending thoracic aortic aneurysm. Recommend semi-annual imaging followup by CTA or MRA and referral to cardiothoracic surgery if not already obtained. This recommendation follows 2010 ACCF/AHA/AATS/ACR/ASA/SCA/SCAI/SIR/STS/SVM Guidelines for the Diagnosis and Management of Patients With Thoracic Aortic Disease. Circulation. 2010; 121: Z610-R604. Aortic aneurysm NOS (ICD10-I71.9). Aortic Atherosclerosis (ICD10-I70.0). Aortic aneurysm NOS (ICD10-I71.9). Electronically Signed   By: Vinnie Langton M.D.   On: 12/02/2019 13:50   DG Chest Port 1 View  Result Date: 12/01/2019 CLINICAL DATA:  Cough, hypoxia. EXAM: PORTABLE CHEST 1 VIEW COMPARISON:  November 22, 2009. FINDINGS: Stable cardiomediastinal silhouette. Interval development left upper and lower lobe opacities concerning for pneumonia. Probable small left pleural effusion may be present. Minimal right basilar subsegmental atelectasis is noted. No pneumothorax is noted. Bony thorax is unremarkable. IMPRESSION: Interval development of left upper and lower lobe opacities concerning for pneumonia. Probable small left pleural effusion may be present. Minimal right basilar subsegmental atelectasis is noted. Electronically Signed   By: Marijo Conception M.D.   On: 12/01/2019 12:54    Microbiology: Recent Results (from the past 240 hour(s))  SARS Coronavirus 2 by RT PCR (hospital order, performed in [facility] hospital lab) Nasopharyngeal Nasopharyngeal Swab     Status: None   Collection Time: 12/01/19 12:40 PM   Specimen: Nasopharyngeal Swab  Result Value Ref Range Status   SARS Coronavirus 2 NEGATIVE NEGATIVE Final    Comment: (NOTE) SARS-CoV-2 target nucleic acids are NOT DETECTED.  The SARS-CoV-2 RNA is generally detectable in upper and lower respiratory specimens during the acute phase of infection. The  lowest concentration of SARS-CoV-2 viral copies this assay can detect is 250 copies / mL. A negative result does not preclude SARS-CoV-2 infection and should not be used as the sole basis for treatment or other patient management decisions.  A negative result may occur with improper specimen collection / handling, submission of specimen other than nasopharyngeal swab, presence of viral mutation(s) within the areas targeted by this assay, and inadequate number of viral copies (<250 copies / mL). A negative result must be combined with clinical observations, patient history, and epidemiological information.  Fact Sheet for Patients:   StrictlyIdeas.no  Fact Sheet for Healthcare Providers: BankingDealers.co.za  This test is not yet approved or  cleared by the [place] FDA and has been authorized for detection and/or diagnosis of SARS-CoV-2 by FDA under an Emergency Use Authorization (EUA).  This EUA will remain in effect (meaning this test can be used) for the duration of the COVID-19 declaration under Section 564(b)(1) of the Act, 21 U.S.C. section 360bbb-3(b)(1), unless the authorization is terminated or revoked sooner.  Performed at [facility], [place] [address], [place], [place] [address]   Respiratory Panel by PCR     Status: None   Collection Time: 12/01/19  6:55 PM   Specimen: Nasopharyngeal Swab; Respiratory  Result Value Ref Range Status   Adenovirus NOT DETECTED NOT DETECTED Final   Coronavirus 229E NOT DETECTED NOT DETECTED Final    Comment: (NOTE) The Coronavirus on the Respiratory Panel, DOES NOT test for the novel  Coronavirus (2019 nCoV)    Coronavirus HKU1 NOT DETECTED NOT DETECTED Final   Coronavirus NL63 NOT DETECTED NOT DETECTED Final   Coronavirus OC43 NOT DETECTED NOT DETECTED Final   Metapneumovirus NOT DETECTED NOT DETECTED Final   Rhinovirus / Enterovirus NOT DETECTED NOT DETECTED Final    Influenza A NOT DETECTED NOT DETECTED Final   Influenza B NOT DETECTED NOT DETECTED Final   Parainfluenza Virus 1 NOT DETECTED NOT DETECTED Final   Parainfluenza Virus 2 NOT DETECTED NOT DETECTED Final   Parainfluenza Virus 3 NOT DETECTED NOT DETECTED Final   Parainfluenza Virus 4 NOT DETECTED NOT DETECTED Final   Respiratory Syncytial Virus NOT DETECTED NOT DETECTED Final   Bordetella pertussis NOT DETECTED NOT DETECTED Final   Chlamydophila pneumoniae NOT DETECTED NOT DETECTED Final   Mycoplasma pneumoniae NOT DETECTED NOT DETECTED Final    Comment: Performed at [facility], [place]. [address], [place], [place] [address]  MRSA PCR Screening     Status: None   Collection Time: 12/02/19 12:30 AM   Specimen: Nasal Mucosa; Nasopharyngeal  Result Value Ref Range Status   MRSA by PCR NEGATIVE NEGATIVE Final    Comment:        The GeneXpert MRSA Assay (FDA approved for NASAL specimens only), is one component of a comprehensive MRSA colonization surveillance program. It is not intended to diagnose MRSA infection nor to guide or monitor treatment for MRSA infections. Performed at [facility], [place] [address], [place], [place] [address]   Culture, blood (routine x 2)     Status: None (Preliminary result)   Collection Time: 12/02/19  3:03 PM   Specimen: BLOOD  Result Value Ref Range Status   Specimen Description   Final    BLOOD RIGHT ANTECUBITAL Performed at [facility] [address], [place], [place] [address]    Special Requests   Final    BOTTLES DRAWN AEROBIC AND ANAEROBIC Blood Culture adequate volume Performed at [facility] [address], [place], [place] [address]    Culture   Final    NO GROWTH 2 DAYS Performed at [facility] [address] [place], [place] [address]    Report Status PENDING  Incomplete  Culture, blood (routine x 2)     Status: None (Preliminary result)   Collection Time:  12/02/19  3:03 PM   Specimen: BLOOD LEFT HAND  Result Value Ref Range Status   Specimen Description   Final    BLOOD LEFT HAND Performed at [facility] [address], [place], [place] [address]    Special Requests   Final    BOTTLES DRAWN AEROBIC ONLY Blood Culture results may not be optimal due to an excessive volume of blood received in culture bottles Performed at [facility] [address] [place],  [address]    Culture   Final    NO GROWTH 2 DAYS Performed at [facility] [address], [place], [place]  [address]    Report Status PENDING  Incomplete     Labs: Basic Metabolic Panel: Recent Labs  Lab 12/01/19 1231 12/01/19 1811 12/02/19 0525 12/03/19 0502 12/04/19 0756  NA 145  --  141 140 141  K 4.0  --  3.9 3.5 3.6  CL 105  --  106 106 105  CO2 28  --  24 25 25   GLUCOSE 118*  --  128* 91 93  BUN 27*  --  27* 19 15  CREATININE 1.43* 1.35* 1.38* 1.12* 1.07*  CALCIUM 8.4*  --  8.1* 7.9* 8.2*  MG  --   --   --   --  1.7   Liver Function Tests: Recent Labs  Lab 12/01/19 1231  AST 19  ALT 10  ALKPHOS 63  BILITOT 0.7  PROT 5.7*  ALBUMIN 2.8*   No results for input(s): LIPASE, AMYLASE in the last 168 hours. No results for input(s): AMMONIA in the last 168 hours. CBC: Recent Labs  Lab 12/01/19 1231 12/01/19 1811 12/02/19 0525 12/03/19 0502 12/04/19 0756  WBC 8.0 9.2 8.4 6.3 5.4  NEUTROABS 5.6  --   --  3.8 3.3  HGB 10.5* 11.6* 10.4* 10.4* 11.1*  HCT 32.8* 36.0 32.6* 33.3* 35.4*  MCV 94.8 93.8 95.3 95.4 95.4  PLT 156 144* 145* 159 161   Cardiac Enzymes: No results for input(s): CKTOTAL, CKMB, CKMBINDEX, TROPONINI in the last 168 hours. BNP: BNP (last 3 results) No results for input(s): BNP in the last 8760 hours.  ProBNP (last 3 results) No results for input(s): PROBNP in the last 8760 hours.  CBG: Recent Labs  Lab 12/03/19 1146  GLUCAP 89       Signed:  Florencia Reasons MD, PhD,  FACP  [facility] 12/04/2019, 2:49 PM

## 2019-12-04 NOTE — NC FL2 (Signed)
[place] LEVEL OF CARE SCREENING TOOL     IDENTIFICATION  Patient Name: Lauren [name] Birthdate: [birth year] Sex: female Admission Date (Current Location): 12/01/2019  [facility] and [place] Number:  Herbalist and Address:  [facility],  [place], [place]      Provider Number: [id number]  Attending Physician Name and Address:  Florencia Reasons, MD  Relative Name and Phone Number:  Flewellen,Cathy Daughter [phone]    Current Level of Care: Hospital Recommended Level of Care: [place], Memory Care ([place]) Prior Approval Number:    Date Approved/Denied:   PASRR Number:    Discharge Plan: Domiciliary (Rest home) ([place])    Current Diagnoses: Patient Active Problem List   Diagnosis Date Noted  . Lobar pneumonia ([place]) 12/02/2019  . CAP (community acquired pneumonia) 12/01/2019  . Respiratory failure ([place]) 12/01/2019  . Hypotension 12/01/2019  . CKD (chronic kidney disease) stage 4, GFR 15-29 ml/min (HCC) 09/01/2017  . Osteoporosis 10/21/2016  . Weight gain 04/16/2016  . Hypocalcemia 04/16/2016  . Hypothyroidism 04/16/2016  . Edema of right foot 04/16/2016  . Dementia ([place]) 10/31/2015  . 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 Loss of weight 03/24/2014  . Hypertension   . High cholesterol   . Anxiety     Orientation RESPIRATION BLADDER Height & Weight     Self  Normal Incontinent Weight: 119 lb 11.2 oz (54.3 kg) Height:  5\' 8"  (172.7 cm)  BEHAVIORAL SYMPTOMS/MOOD NEUROLOGICAL BOWEL NUTRITION STATUS      Continent Diet  AMBULATORY STATUS COMMUNICATION OF NEEDS Skin   Supervision Verbally Normal                       Personal Care Assistance Level of Assistance  Bathing, Dressing, Feeding Bathing Assistance: Limited assistance Feeding assistance: Limited assistance Dressing Assistance: Limited assistance     Functional Limitations Info  Sight,  Speech, Hearing Sight Info: Adequate Hearing Info: Adequate Speech Info: Adequate    SPECIAL CARE FACTORS FREQUENCY  PT (By licensed PT)     PT Frequency: Home Health minimum 2x a week              Contractures Contractures Info: Not present    Additional Factors Info  Code Status, Allergies, Psychotropic Code Status Info: DNR Allergies Info: Aricept and Aspirin Psychotropic Info: buPROPion (WELLBUTRIN XL)  25 mg and mirtazapine (REMERON) tablet 7.5 mg       Current Medications (12/04/2019):  This is the current hospital active medication list Current Facility-Administered Medications  Medication Dose Route Frequency Provider Last Rate Last Admin  . atorvastatin (LIPITOR) tablet 5 mg  5 mg Oral Daily Harold Hedge, MD   5 mg at 12/04/19 1031  . buPROPion (WELLBUTRIN XL) 24 hr tablet 150 mg  150 mg Oral Daily Harold Hedge, MD   150 mg at 12/04/19 1032  . cefTRIAXone (ROCEPHIN) 1 g in sodium chloride 0.9 % 100 mL IVPB  1 g Intravenous Q24H Florencia Reasons, MD 200 mL/hr at 12/03/19 1558 1 g at 12/03/19 1558  . enoxaparin (LOVENOX) injection 30 mg  30 mg Subcutaneous Q24H Harold Hedge, MD   30 mg at 12/03/19 1725  . feeding supplement (KATE FARMS STANDARD 1.4) liquid 325 mL  325 mL Oral BID BM Florencia Reasons, MD      . guaiFENesin [facility]) 12 hr tablet 600 mg  600 mg Oral BID Florencia Reasons,  MD   600 mg at 12/04/19 1031  . levothyroxine (SYNTHROID) tablet 88 mcg  88 mcg Oral Daily Harold Hedge, MD   88 mcg at 12/03/19 6546  . melatonin tablet 6 mg  6 mg Oral QHS PRN Lang Snow, FNP   6 mg at 12/03/19 2109  . memantine (NAMENDA XR) 24 hr capsule 14 mg  14 mg Oral Daily Harold Hedge, MD   14 mg at 12/04/19 1031  . metoprolol tartrate (LOPRESSOR) injection 5 mg  5 mg Intravenous Q6H PRN Harold Hedge, MD      . metoprolol tartrate (LOPRESSOR) tablet 12.5 mg  12.5 mg Oral BID Florencia Reasons, MD   12.5 mg at 12/04/19 1247  . metroNIDAZOLE (FLAGYL) IVPB 500 mg  500 mg Intravenous Blair Promise, MD  100 mL/hr at 12/04/19 1409 500 mg at 12/04/19 1409  . mirtazapine (REMERON) tablet 7.5 mg  7.5 mg Oral QHS Harold Hedge, MD   7.5 mg at 12/03/19 2109  . multivitamin with minerals tablet 1 tablet  1 tablet Oral Daily Florencia Reasons, MD   1 tablet at 12/04/19 1032  . protein supplement (RESOURCE BENEPROTEIN) powder packet 6 g  1 Scoop Oral TID WC Florencia Reasons, MD   6 g at 12/03/19 0817  . Resource ThickenUp Clear   Oral PRN Florencia Reasons, MD      . sertraline (ZOLOFT) tablet 25 mg  25 mg Oral Daily Harold Hedge, MD   25 mg at 12/04/19 1031     Discharge Medications: STOP taking these medications   amLODipine 5 MG tablet Commonly known as: NORVASC   hydrALAZINE 25 MG tablet Commonly known as: APRESOLINE   losartan 100 MG tablet Commonly known as: COZAAR   sertraline 25 MG tablet Commonly known as: ZOLOFT   Vitamin D (Ergocalciferol) 1.25 MG (50000 UNIT) Caps capsule Commonly known as: DRISDOL     TAKE these medications   amoxicillin-clavulanate 500-125 MG tablet Commonly known as: Augmentin Take 1 tablet (500 mg total) by mouth 2 (two) times daily for 6 days.   atorvastatin 10 MG tablet Commonly known as: LIPITOR Take 0.5 tablets (5 mg total) by mouth daily.   buPROPion 150 MG 24 hr tablet Commonly known as: WELLBUTRIN XL TAKE 1 TABLET DAILY IN THE MORNING FOR ANXIETY What changed: See the new instructions.   Calcium + Vitamin D3 600-400 MG-UNIT Tabs Generic drug: Calcium Carbonate-Vitamin D3 Take 1 tablet by mouth daily.   ENSURE PO Take 1 each by mouth in the morning and at bedtime.   guaiFENesin 600 MG 12 hr tablet Commonly known as: MUCINEX Take 1 tablet (600 mg total) by mouth 2 (two) times daily.   levothyroxine 88 MCG tablet Commonly known as: SYNTHROID Take 88 mcg by mouth daily.   memantine 14 MG Cp24 24 hr capsule Commonly known as: NAMENDA XR TAKE 1 CAPSULE DAILY FOR MEMORY What changed: See the new instructions.   metoprolol tartrate 50 MG  tablet Commonly known as: LOPRESSOR TAKE 1 TABLET TWICE A DAY   mirtazapine 15 MG tablet Commonly known as: Remeron Take 1 tablet (15 mg total) by mouth at bedtime. What changed:   medication strength  how much to take   Saccharomyces boulardii 250 MG Pack Take 250 mg by mouth 2 (two) times daily for 10 days.     Relevant Imaging Results:  Relevant Lab Results:   Additional Information SSN [id number]  Ross Ludwig, LCSW

## 2019-12-04 NOTE — Progress Notes (Signed)
Geneticist, molecular received referral for pt to participate in [facility] outpt palliative program upon discharge.   Called daughter, [name], to confirm interest. She stated that she is not interested in services at this time. Liaison advised to call [place] with any changes. She was thankful for call.  Freddie Breech, RN  [facility] Liaison [phone]

## 2019-12-04 NOTE — Plan of Care (Signed)
Patient being discharged back to [facility].  Discharge instructions reviewed with patients daughter as well as AVS placed in packet for [facility].  Awaiting [facility] for transport.

## 2019-12-05 ENCOUNTER — Emergency Department (HOSPITAL_COMMUNITY): Payer: Medicare HMO

## 2019-12-05 ENCOUNTER — Inpatient Hospital Stay (HOSPITAL_COMMUNITY)
Admission: EM | Admit: 2019-12-05 | Discharge: 2019-12-09 | DRG: 193 | Disposition: A | Discharge status: Skilled Nursing Facility | Payer: Medicare HMO | Source: Skilled Nursing Facility | Attending: Internal Medicine | Admitting: Internal Medicine

## 2019-12-05 ENCOUNTER — Encounter (HOSPITAL_COMMUNITY): Payer: Self-pay

## 2019-12-05 ENCOUNTER — Other Ambulatory Visit: Payer: Self-pay

## 2019-12-05 DIAGNOSIS — F0391 Unspecified dementia with behavioral disturbance: Secondary | ICD-10-CM | POA: Diagnosis present

## 2019-12-05 DIAGNOSIS — F0150 Vascular dementia without behavioral disturbance: Secondary | ICD-10-CM

## 2019-12-05 DIAGNOSIS — E785 Hyperlipidemia, unspecified: Secondary | ICD-10-CM | POA: Diagnosis present

## 2019-12-05 DIAGNOSIS — F329 Major depressive disorder, single episode, unspecified: Secondary | ICD-10-CM | POA: Diagnosis present

## 2019-12-05 DIAGNOSIS — Z79899 Other long term (current) drug therapy: Secondary | ICD-10-CM

## 2019-12-05 DIAGNOSIS — Z515 Encounter for palliative care: Secondary | ICD-10-CM | POA: Diagnosis not present

## 2019-12-05 DIAGNOSIS — I712 Thoracic aortic aneurysm, without rupture: Secondary | ICD-10-CM | POA: Diagnosis present

## 2019-12-05 DIAGNOSIS — R339 Retention of urine, unspecified: Secondary | ICD-10-CM | POA: Diagnosis not present

## 2019-12-05 DIAGNOSIS — L719 Rosacea, unspecified: Secondary | ICD-10-CM | POA: Diagnosis present

## 2019-12-05 DIAGNOSIS — N1831 Chronic kidney disease, stage 3a: Secondary | ICD-10-CM | POA: Diagnosis present

## 2019-12-05 DIAGNOSIS — N1832 Chronic kidney disease, stage 3b: Secondary | ICD-10-CM | POA: Diagnosis not present

## 2019-12-05 DIAGNOSIS — Z886 Allergy status to analgesic agent status: Secondary | ICD-10-CM

## 2019-12-05 DIAGNOSIS — F419 Anxiety disorder, unspecified: Secondary | ICD-10-CM | POA: Diagnosis present

## 2019-12-05 DIAGNOSIS — R0902 Hypoxemia: Secondary | ICD-10-CM

## 2019-12-05 DIAGNOSIS — Z681 Body mass index (BMI) 19 or less, adult: Secondary | ICD-10-CM

## 2019-12-05 DIAGNOSIS — Z801 Family history of malignant neoplasm of trachea, bronchus and lung: Secondary | ICD-10-CM

## 2019-12-05 DIAGNOSIS — Z85828 Personal history of other malignant neoplasm of skin: Secondary | ICD-10-CM

## 2019-12-05 DIAGNOSIS — Z823 Family history of stroke: Secondary | ICD-10-CM

## 2019-12-05 DIAGNOSIS — J9601 Acute respiratory failure with hypoxia: Secondary | ICD-10-CM | POA: Diagnosis not present

## 2019-12-05 DIAGNOSIS — W19XXXA Unspecified fall, initial encounter: Secondary | ICD-10-CM | POA: Diagnosis present

## 2019-12-05 DIAGNOSIS — M8448XA Pathological fracture, other site, initial encounter for fracture: Secondary | ICD-10-CM | POA: Diagnosis present

## 2019-12-05 DIAGNOSIS — R269 Unspecified abnormalities of gait and mobility: Secondary | ICD-10-CM | POA: Diagnosis present

## 2019-12-05 DIAGNOSIS — E039 Hypothyroidism, unspecified: Secondary | ICD-10-CM | POA: Diagnosis present

## 2019-12-05 DIAGNOSIS — J189 Pneumonia, unspecified organism: Principal | ICD-10-CM

## 2019-12-05 DIAGNOSIS — Z8249 Family history of ischemic heart disease and other diseases of the circulatory system: Secondary | ICD-10-CM

## 2019-12-05 DIAGNOSIS — I129 Hypertensive chronic kidney disease with stage 1 through stage 4 chronic kidney disease, or unspecified chronic kidney disease: Secondary | ICD-10-CM | POA: Diagnosis present

## 2019-12-05 DIAGNOSIS — Z79890 Hormone replacement therapy (postmenopausal): Secondary | ICD-10-CM

## 2019-12-05 DIAGNOSIS — E890 Postprocedural hypothyroidism: Secondary | ICD-10-CM | POA: Diagnosis present

## 2019-12-05 DIAGNOSIS — Z20822 Contact with and (suspected) exposure to covid-19: Secondary | ICD-10-CM | POA: Diagnosis present

## 2019-12-05 DIAGNOSIS — R54 Age-related physical debility: Secondary | ICD-10-CM | POA: Diagnosis present

## 2019-12-05 DIAGNOSIS — F0390 Unspecified dementia without behavioral disturbance: Secondary | ICD-10-CM | POA: Diagnosis present

## 2019-12-05 DIAGNOSIS — Z66 Do not resuscitate: Secondary | ICD-10-CM | POA: Diagnosis present

## 2019-12-05 DIAGNOSIS — I10 Essential (primary) hypertension: Secondary | ICD-10-CM | POA: Diagnosis present

## 2019-12-05 DIAGNOSIS — Z888 Allergy status to other drugs, medicaments and biological substances status: Secondary | ICD-10-CM

## 2019-12-05 DIAGNOSIS — Y95 Nosocomial condition: Secondary | ICD-10-CM | POA: Diagnosis present

## 2019-12-05 LAB — CBC WITH DIFFERENTIAL/PLATELET
Abs Immature Granulocytes: 0.12 10*3/uL — ABNORMAL HIGH (ref 0.00–0.07)
Basophils Absolute: 0 10*3/uL (ref 0.0–0.1)
Basophils Relative: 0 %
Eosinophils Absolute: 0.2 10*3/uL (ref 0.0–0.5)
Eosinophils Relative: 1 %
HCT: 38.2 % (ref 36.0–46.0)
Hemoglobin: 12.2 g/dL (ref 12.0–15.0)
Immature Granulocytes: 1 %
Lymphocytes Relative: 7 %
Lymphs Abs: 1.2 10*3/uL (ref 0.7–4.0)
MCH: 30 pg (ref 26.0–34.0)
MCHC: 31.9 g/dL (ref 30.0–36.0)
MCV: 93.9 fL (ref 80.0–100.0)
Monocytes Absolute: 1.2 10*3/uL — ABNORMAL HIGH (ref 0.1–1.0)
Monocytes Relative: 7 %
Neutro Abs: 13.8 10*3/uL — ABNORMAL HIGH (ref 1.7–7.7)
Neutrophils Relative %: 84 %
Platelets: 177 10*3/uL (ref 150–400)
RBC: 4.07 MIL/uL (ref 3.87–5.11)
RDW: 15.3 % (ref 11.5–15.5)
WBC: 16.6 10*3/uL — ABNORMAL HIGH (ref 4.0–10.5)
nRBC: 0 % (ref 0.0–0.2)

## 2019-12-05 LAB — COMPREHENSIVE METABOLIC PANEL
ALT: 15 U/L (ref 0–44)
AST: 49 U/L — ABNORMAL HIGH (ref 15–41)
Albumin: 3.1 g/dL — ABNORMAL LOW (ref 3.5–5.0)
Alkaline Phosphatase: 76 U/L (ref 38–126)
Anion gap: 12 (ref 5–15)
BUN: 14 mg/dL (ref 8–23)
CO2: 24 mmol/L (ref 22–32)
Calcium: 8.6 mg/dL — ABNORMAL LOW (ref 8.9–10.3)
Chloride: 104 mmol/L (ref 98–111)
Creatinine, Ser: 1.11 mg/dL — ABNORMAL HIGH (ref 0.44–1.00)
GFR calc Af Amer: 50 mL/min — ABNORMAL LOW (ref >60)
GFR calc non Af Amer: 43 mL/min — ABNORMAL LOW (ref >60)
Glucose, Bld: 116 mg/dL — ABNORMAL HIGH (ref 70–99)
Potassium: 4.9 mmol/L (ref 3.5–5.1)
Sodium: 140 mmol/L (ref 135–145)
Total Bilirubin: 1.4 mg/dL — ABNORMAL HIGH (ref 0.3–1.2)
Total Protein: 6.7 g/dL (ref 6.5–8.1)

## 2019-12-05 LAB — I-STAT CHEM 8, ED
BUN: 17 mg/dL (ref 8–23)
Calcium, Ion: 1.09 mmol/L — ABNORMAL LOW (ref 1.15–1.40)
Chloride: 104 mmol/L (ref 98–111)
Creatinine, Ser: 1 mg/dL (ref 0.44–1.00)
Glucose, Bld: 115 mg/dL — ABNORMAL HIGH (ref 70–99)
HCT: 34 % — ABNORMAL LOW (ref 36.0–46.0)
Hemoglobin: 11.6 g/dL — ABNORMAL LOW (ref 12.0–15.0)
Potassium: 5.1 mmol/L (ref 3.5–5.1)
Sodium: 141 mmol/L (ref 135–145)
TCO2: 26 mmol/L (ref 22–32)

## 2019-12-05 LAB — LACTIC ACID, PLASMA: Lactic Acid, Venous: 1.9 mmol/L (ref 0.5–1.9)

## 2019-12-05 LAB — SARS CORONAVIRUS 2 BY RT PCR (HOSPITAL ORDER, PERFORMED IN ~~LOC~~ HOSPITAL LAB): SARS Coronavirus 2: NEGATIVE

## 2019-12-05 MED ORDER — SODIUM CHLORIDE 0.45 % IV SOLN: INTRAVENOUS | Status: DC

## 2019-12-05 MED ORDER — CALCIUM CARBONATE-VITAMIN D 500-200 MG-UNIT PO TABS
1.0000 | ORAL_TABLET | Freq: Every day | ORAL | Status: DC
  Administered 2019-12-06: 11:00:00 1 via ORAL
  Filled 2019-12-05 (×5): qty 1

## 2019-12-05 MED ORDER — MIRTAZAPINE 15 MG PO TABS
15.0000 mg | ORAL_TABLET | Freq: Every day | ORAL | Status: DC
  Administered 2019-12-05 – 2019-12-07 (×3): 15 mg via ORAL
  Filled 2019-12-05 (×4): qty 1

## 2019-12-05 MED ORDER — ACETAMINOPHEN 650 MG RE SUPP: 650.0000 mg | Freq: Four times a day (QID) | RECTAL | Status: DC | PRN

## 2019-12-05 MED ORDER — MEMANTINE HCL ER 14 MG PO CP24
14.0000 mg | ORAL_CAPSULE | Freq: Every day | ORAL | Status: DC
  Administered 2019-12-06 – 2019-12-09 (×2): 14 mg via ORAL
  Filled 2019-12-05 (×4): qty 1

## 2019-12-05 MED ORDER — ENOXAPARIN SODIUM 30 MG/0.3ML ~~LOC~~ SOLN
30.0000 mg | SUBCUTANEOUS | Status: DC
  Administered 2019-12-06 – 2019-12-09 (×3): 30 mg via SUBCUTANEOUS
  Filled 2019-12-05 (×4): qty 0.3

## 2019-12-05 MED ORDER — HYDROCODONE-ACETAMINOPHEN 5-325 MG PO TABS: 1.0000 | ORAL_TABLET | ORAL | Status: DC | PRN

## 2019-12-05 MED ORDER — VANCOMYCIN HCL 500 MG/100ML IV SOLN: 500.0000 mg | INTRAVENOUS | Status: DC

## 2019-12-05 MED ORDER — SACCHAROMYCES BOULARDII 250 MG PO CAPS
250.0000 mg | ORAL_CAPSULE | Freq: Two times a day (BID) | ORAL | Status: DC
  Administered 2019-12-05 – 2019-12-09 (×5): 250 mg via ORAL
  Filled 2019-12-05 (×8): qty 1

## 2019-12-05 MED ORDER — METOPROLOL TARTRATE 50 MG PO TABS
50.0000 mg | ORAL_TABLET | Freq: Two times a day (BID) | ORAL | Status: DC
  Administered 2019-12-05 – 2019-12-09 (×5): 50 mg via ORAL
  Filled 2019-12-05 (×8): qty 1

## 2019-12-05 MED ORDER — BUPROPION HCL ER (XL) 150 MG PO TB24
150.0000 mg | ORAL_TABLET | Freq: Every day | ORAL | Status: DC
  Administered 2019-12-06 – 2019-12-09 (×2): 150 mg via ORAL
  Filled 2019-12-05 (×4): qty 1

## 2019-12-05 MED ORDER — ACETAMINOPHEN 325 MG PO TABS: 650.0000 mg | ORAL_TABLET | Freq: Four times a day (QID) | ORAL | Status: DC | PRN

## 2019-12-05 MED ORDER — SODIUM CHLORIDE 0.9 % IV SOLN
2.0000 g | Freq: Once | INTRAVENOUS | Status: AC
  Administered 2019-12-05: 2 g via INTRAVENOUS
  Filled 2019-12-05: qty 2

## 2019-12-05 MED ORDER — GUAIFENESIN ER 600 MG PO TB12
600.0000 mg | ORAL_TABLET | Freq: Two times a day (BID) | ORAL | Status: DC
  Administered 2019-12-05 – 2019-12-09 (×5): 600 mg via ORAL
  Filled 2019-12-05 (×8): qty 1

## 2019-12-05 MED ORDER — ATORVASTATIN CALCIUM 10 MG PO TABS
5.0000 mg | ORAL_TABLET | Freq: Every day | ORAL | Status: DC
  Administered 2019-12-06 – 2019-12-09 (×2): 5 mg via ORAL
  Filled 2019-12-05 (×4): qty 1

## 2019-12-05 MED ORDER — LEVOTHYROXINE SODIUM 88 MCG PO TABS
88.0000 ug | ORAL_TABLET | Freq: Every day | ORAL | Status: DC
  Administered 2019-12-06 – 2019-12-08 (×2): 88 ug via ORAL
  Filled 2019-12-05 (×3): qty 1

## 2019-12-05 MED ORDER — IOHEXOL 350 MG/ML SOLN
80.0000 mL | Freq: Once | INTRAVENOUS | Status: AC | PRN
  Administered 2019-12-05: 80 mL via INTRAVENOUS

## 2019-12-05 MED ORDER — VANCOMYCIN HCL IN DEXTROSE 1-5 GM/200ML-% IV SOLN
1000.0000 mg | Freq: Once | INTRAVENOUS | Status: AC
  Administered 2019-12-05: 1000 mg via INTRAVENOUS
  Filled 2019-12-05: qty 200

## 2019-12-05 MED ORDER — SODIUM CHLORIDE 0.9 % IV SOLN: 2.0000 g | INTRAVENOUS | Status: DC

## 2019-12-05 NOTE — H&P (Signed)
[facility] History & Physical  Rob Hope E Tantillo 12/05/2019  Chief Complaint: Back pain, hypoxemia HPI: The patient is a 86 y.o. year-old w/ hx of dementia, HTN, HL, thyroid disease who was admitted here (see below) for 4 days for a multifocal PNA (COVID neg), improved w/ 4 days IV abx and was dc'd yesterday back to memory care at [facility].  Pt fell yesterday and today c/o back pain, also her RA sat was low at around 70% per EMS when they arrived to get her. Pt taken to ED. CTA chest neg for PE, shows no change in MF pna pattern from admit this week. Pt given IV vanc/ cefepime. Asked to see for admission.   Pt was admitted here 9/15- 9/18 from ALF when monthly vitals showed low oxygen at 86/5 and BP 90/ 50, had also recent productive cough and was taking po abx for prior 1-2 wks. CXR showed LUL and LLL opacities and she was admitted.  She rec'd IV abx vanc/ cefepime. COVID and viral panel were negative. QuantiFeron text in April 2021 was reportedly negative. ID rec'd rocephin and flagyl and pt slowly improved and at d/c her ambulating SpO2 was 91% on RA.  Dc'd on 6d po augmentin to make 10d total abx Rx.  1 of 4 BP meds (her BB) were resumed at dc, BP was up 130/ 80 at dc, the others were held. Pt was DNR per family / daughter request while in hospital.  Pt dc'd back to memory care unit at [facility].   Today pt doesn't give any sig history the dtr at bedside states her short and long term memory are "shot" but she is able to have a conversation.  Pt has been independent at her ALF/ memory care, walking / dressing herself.  Per dtr patient's cough has resolved w/ rx of PNA as above. No fevers or c/o per the pt today except "shooting pain" in chest or back, not sure, maybe both. Comes and goes in a couple of seconds. Pt doesn't remember falling yesterday.   Dtr confirms DNR status. Pt denies SOB here, SpO2 90's on 2L [place].    ROS  no joint pain   no HA  no blurry  vision  no rash  no diarrhea  no nausea/ vomiting  no dysuria  no difficulty voiding  no change in urine color   Past Medical History  Past Medical History:  Diagnosis Date  . Anxiety   . Cancer ([place]) 12/01/2018   skin cancer on her head(L)  . High cholesterol   . Hypertension   . Osteoporosis   . Rosacea   . Thyroid disease   . Weight loss    Past Surgical History  Past Surgical History:  Procedure Laterality Date  . CHOLECYSTECTOMY  1959   [place]  . THYROIDECTOMY  2003   [place]  . TONSILLECTOMY AND ADENOIDECTOMY     Family History  Family History  Problem Relation Age of Onset  . Hypertension Mother   . Stroke Mother   . Stroke Father   . Lung cancer Daughter    Social History  reports that she has never smoked. She has never used smokeless tobacco. She reports that she does not drink alcohol and does not use drugs. Allergies  Allergies  Allergen Reactions  . Aricept [Donepezil Hcl] Other (See Comments)    Unknown reaction  . Aspirin Other (See Comments)    Upset stomach  Home medications Prior to Admission medications   Medication Sig Start Date End Date Taking? Authorizing Provider  amoxicillin-clavulanate (AUGMENTIN) 500-125 MG tablet Take 1 tablet (500 mg total) by mouth 2 (two) times daily for 6 days. 12/04/19 12/10/19 Yes Florencia Reasons, MD  atorvastatin (LIPITOR) 10 MG tablet Take 0.5 tablets (5 mg total) by mouth daily. 01/19/19  Yes [name], NP  buPROPion (WELLBUTRIN XL) 150 MG 24 hr tablet TAKE 1 TABLET DAILY IN THE MORNING FOR ANXIETY Patient taking differently: Take 150 mg by mouth daily.  06/03/19  Yes [name], NP  Calcium Carbonate-Vitamin D3 (CALCIUM + VITAMIN D3) 600-400 MG-UNIT TABS Take 1 tablet by mouth daily.   Yes [provider]  guaiFENesin (MUCINEX) 600 MG 12 hr tablet Take 1 tablet (600 mg total) by mouth 2 (two) times daily. 12/04/19  Yes Florencia Reasons, MD  levothyroxine (SYNTHROID) 88 MCG tablet Take 88 mcg by  mouth daily. 08/20/19  Yes [provider]  memantine (NAMENDA XR) 14 MG CP24 24 hr capsule TAKE 1 CAPSULE DAILY FOR MEMORY Patient taking differently: Take 14 mg by mouth daily.  01/18/19  Yes [name], NP  metoprolol tartrate (LOPRESSOR) 50 MG tablet TAKE 1 TABLET TWICE A DAY Patient taking differently: Take 50 mg by mouth 2 (two) times daily.  09/22/18  Yes [name], NP  mirtazapine (REMERON) 15 MG tablet Take 1 tablet (15 mg total) by mouth at bedtime. 12/04/19 01/03/20 Yes Florencia Reasons, MD  Nutritional Supplements (ENSURE PO) Take 1 Can by mouth in the morning and at bedtime.    Yes [provider]  Saccharomyces boulardii 250 MG PACK Take 250 mg by mouth 2 (two) times daily for 10 days. 12/04/19 12/14/19 Yes Florencia Reasons, MD       Exam Gen alert, elderly WF, no distress, lying on back, 2L [place] 95% SpO2 No rash, cyanosis or gangrene Sclera anicteric, throat clear  No jvd or bruits Chest occ crackles L base o/w clear bilat RRR no MRG Abd soft ntnd no mass or ascites +bs GU defer MS no joint effusions or deformity Ext no leg or UE edema, no wounds or ulcers Neuro is alert, Ox person only, pleasant and conversant , nonfocal, moves all ext w/ mild gen'd weakness    Home meds:  - metoprolol  50 bid  - remeron 15 hs / memantine xr 78m qd/ wellbutrin xl 150 qd for anxiety  - lipitor 5 qd/ synthroid 88ug qd  - prn's/ vitamins/ supplements    Labs- Na 140 K 4.9  CO2 24  BN `14  Cr 1.11 (baseline 1- 1.1 recent labs).  Alb 3.1  Ca 8.6  LFT's tbili 1.4, AST 49 ^, ALT 15 ok   egfr 43 ml/min     WBC 16K (5 K on dc 9/18)     Hb 12  plt 177     COVID -19 negative today    CTA chest >  IMPRESSION: - CTA of the chest: No acute dissection is noted. Stable aneurysmal dilatation of the aorta to 4.7 cm is noted. Ascending thoracic aortic aneurysm. Recommend semi-annual imaging followup by CTA or MRA and referral to cardiothoracic surgery if not already obtained. - No  evidence of pulmonary emboli. - Patchy multifocal pneumonia bilaterally similar to that seen on the prior exam from 3 days previous. - CTA of the abdomen and pelvis: No evidence of abdominal aortic aneurysm or dissection is noted. - New L1 compression deformity  is noted.  Mild height loss is noted.   Assessment/ Plan: 1. Hypoxemia - following recent admission for multifocal PNA.  CT chest tonight shows similar infiltrates to CT earlier this week, unchanged.  No real new symptoms of PNA, cough has resolved. Pt afebrile, only abnl is WBC 16k. Will cont IV abx for now, but if blood cx's are negative and pt remains free of pna symptoms, may consider holding further IV abx and resume her po augmentin (supposed to take through 9/25). May need some O2 at ALF for sometime until lungs can heal.  2. Compression fracture L1 - after a fall yesterday, suspect traumatic. Pt having "shooting pains" which are prob related to this, no distress though and not lasting pains, hold pain meds for now. Prn low dose vicodin.  3. Dementia - sig memory loss, able to converse. Cont meds 4. Depression/ anxiety - cont meds 5. DNR - per family 23. Recent multifocal PNA - as above, abx as above 7. HTN - BP's controlled, cont metoprolol 8. CKD 3b - b/l creat 1.0- 1.2, eGFR 31- 45. Creat at baseline, gentle IVF's.       Kelly Splinter  MD 12/05/2019, 8:29 PM

## 2019-12-05 NOTE — ED Triage Notes (Signed)
Per [facility], Pt is coming from [facility]. Staff called EMS due to pt complaining of back pain they believe is related to a fall yesterday. On scene pt was had a O2 saturation of 78%. When placed on 3L oxygen pt was at 94%. Pt was recently discharged from the hospital for pneumonia. EMS also noted that the pt was AFIB on monitor, with no previous hx. Pt has dementia, alert to baseline.

## 2019-12-05 NOTE — ED Provider Notes (Signed)
[facility] DEPT Provider Note   CSN: [id number] Arrival date & time: 12/05/19  1456     History Chief Complaint  Patient presents with  . Hypoxemic  . Back Pain    Lauren [name] is a 86 y.o. female hx of hypertension, here presenting with hypoxia.  Patient was just admitted to the hospital and discharged to assisted living yesterday.  Patient apparently had a fall yesterday and had some worsening hypoxia.  When patient was in the hospital she was initially had oxygen saturation 86% but when she was discharged yesterday, her oxygen was above 93%.  Was noted to be hypoxic to 70% on room air per EMS.  Patient is still on Augmentin for pneumonia.  The history is provided by the patient.       Past Medical History:  Diagnosis Date  . Anxiety   . Cancer ([place]) 12/01/2018   skin cancer on her head(L)  . High cholesterol   . Hypertension   . Osteoporosis   . Rosacea   . Thyroid disease   . Weight loss     Patient Active Problem List   Diagnosis Date Noted  . Lobar pneumonia ([place]) 12/02/2019  . CAP (community acquired pneumonia) 12/01/2019  . Respiratory failure ([place]) 12/01/2019  . Hypotension 12/01/2019  . CKD (chronic kidney disease) stage 4, GFR 15-29 ml/min (HCC) 09/01/2017  . Osteoporosis 10/21/2016  . Weight gain 04/16/2016  . Hypocalcemia 04/16/2016  . Hypothyroidism 04/16/2016  . Edema of right foot 04/16/2016  . Dementia ([place]) 10/31/2015  . 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 Loss of weight 03/24/2014  . Hypertension   . High cholesterol   . Anxiety     Past Surgical History:  Procedure Laterality Date  . CHOLECYSTECTOMY  1959   [place]  . THYROIDECTOMY  2003   [place]  . TONSILLECTOMY AND ADENOIDECTOMY       OB History   No obstetric history on file.     Family History  Problem Relation Age of Onset  . Hypertension Mother   . Stroke Mother   . Stroke Father   . Lung cancer Daughter     Social History    Tobacco Use  . Smoking status: Never Smoker  . Smokeless tobacco: Never Used  Vaping Use  . Vaping Use: Never used  Substance Use Topics  . Alcohol use: No    Alcohol/week: 0.0 standard drinks  . Drug use: No    Home Medications Prior to Admission medications   Medication Sig Start Date End Date Taking? Authorizing Provider  amoxicillin-clavulanate (AUGMENTIN) 500-125 MG tablet Take 1 tablet (500 mg total) by mouth 2 (two) times daily for 6 days. 12/04/19 12/10/19 Yes Florencia Reasons, MD  atorvastatin (LIPITOR) 10 MG tablet Take 0.5 tablets (5 mg total) by mouth daily. 01/19/19  Yes [name], NP  buPROPion (WELLBUTRIN XL) 150 MG 24 hr tablet TAKE 1 TABLET DAILY IN THE MORNING FOR ANXIETY Patient taking differently: Take 150 mg by mouth daily.  06/03/19  Yes [name], NP  Calcium Carbonate-Vitamin D3 (CALCIUM + VITAMIN D3) 600-400 MG-UNIT TABS Take 1 tablet by mouth daily.   Yes [provider]  guaiFENesin (MUCINEX) 600 MG 12 hr tablet Take 1 tablet (600 mg total) by mouth 2 (two) times daily. 12/04/19  Yes Florencia Reasons, MD  levothyroxine (SYNTHROID) 88 MCG tablet Take 88 mcg by mouth daily. 08/20/19  Yes [provider]  memantine (NAMENDA XR) 14 MG CP24 24  hr capsule TAKE 1 CAPSULE DAILY FOR MEMORY Patient taking differently: Take 14 mg by mouth daily.  01/18/19  Yes [name], NP  metoprolol tartrate (LOPRESSOR) 50 MG tablet TAKE 1 TABLET TWICE A DAY Patient taking differently: Take 50 mg by mouth 2 (two) times daily.  09/22/18  Yes [name], NP  mirtazapine (REMERON) 15 MG tablet Take 1 tablet (15 mg total) by mouth at bedtime. 12/04/19 01/03/20 Yes Florencia Reasons, MD  Nutritional Supplements (ENSURE PO) Take 1 Can by mouth in the morning and at bedtime.    Yes [provider]  Saccharomyces boulardii 250 MG PACK Take 250 mg by mouth 2 (two) times daily for 10 days. 12/04/19 12/14/19 Yes Florencia Reasons, MD    Allergies    Aricept Reather Littler hcl]  and Aspirin  Review of Systems   Review of Systems  Respiratory: Positive for shortness of breath.   Musculoskeletal: Positive for back pain.  All other systems reviewed and are negative.   Physical Exam Updated Vital Signs BP (!) 149/85   Pulse 74   Temp 98.6 F (37 C) (Oral)   Resp (!) 22   SpO2 95%   Physical Exam Vitals and nursing note reviewed.  Constitutional:      Comments: Chronically ill   HENT:     Head: Normocephalic.     Nose: Nose normal.     Mouth/Throat:     Mouth: Mucous membranes are moist.  Eyes:     Extraocular Movements: Extraocular movements intact.     Pupils: Pupils are equal, round, and reactive to light.  Cardiovascular:     Rate and Rhythm: Normal rate and regular rhythm.     Pulses: Normal pulses.     Heart sounds: Normal heart sounds.  Pulmonary:     Effort: Pulmonary effort is normal.     Comments: Crackles bilateral bases  Abdominal:     General: Abdomen is flat.     Palpations: Abdomen is soft.  Musculoskeletal:        General: Normal range of motion.     Cervical back: Normal range of motion.     Comments: Good peripheral pulses   Skin:    General: Skin is warm.     Capillary Refill: Capillary refill takes less than 2 seconds.  Neurological:     General: No focal deficit present.     Comments: Demented, A & O x 2. Moving all extremities      ED Results / Procedures / Treatments   Labs (all labs ordered are listed, but only abnormal results are displayed) Labs Reviewed  CBC WITH DIFFERENTIAL/PLATELET - Abnormal; Notable for the following components:      Result Value   WBC 16.6 (*)    Neutro Abs 13.8 (*)    Monocytes Absolute 1.2 (*)    Abs Immature Granulocytes 0.12 (*)    All other components within normal limits  COMPREHENSIVE METABOLIC PANEL - Abnormal; Notable for the following components:   Glucose, Bld 116 (*)    Creatinine, Ser 1.11 (*)    Calcium 8.6 (*)    Albumin 3.1 (*)    AST 49 (*)    Total Bilirubin  1.4 (*)    GFR calc non Af Amer 43 (*)    GFR calc Af Amer 50 (*)    All other components within normal limits  I-STAT CHEM 8, ED - Abnormal; Notable for the following components:   Glucose, Bld 115 (*)  Calcium, Ion 1.09 (*)    Hemoglobin 11.6 (*)    HCT 34.0 (*)    All other components within normal limits  CULTURE, BLOOD (ROUTINE X 2)  CULTURE, BLOOD (ROUTINE X 2)  SARS CORONAVIRUS 2 BY RT PCR (HOSPITAL ORDER, [facility])  LACTIC ACID, PLASMA  LACTIC ACID, PLASMA    EKG None  Radiology DG Chest Port 1 View  Result Date: 12/05/2019 CLINICAL DATA:  Shortness of breath. Fall. Recently treated for pneumonia. EXAM: PORTABLE CHEST 1 VIEW COMPARISON:  Chest x-ray December 01, 2019. Chest CT December 02, 2019. FINDINGS: Multifocal infiltrates, left greater than right, are similar on the left in the interval. The infiltrate on the right is mildly increased at the base. No pneumothorax. No other changes. IMPRESSION: 1. Persistent multifocal infiltrates, stable on the left and mildly increased on the right in the interval most consistent with multifocal pneumonia given recent CT imaging. No other interval changes. Electronically Signed   By: Dorise Bullion III M.D   On: 12/05/2019 17:03    Procedures Procedures (including critical care time)  CRITICAL CARE Performed by: Wandra Arthurs   Total critical care time: 30 minutes  Critical care time was exclusive of separately billable procedures and treating other patients.  Critical care was necessary to treat or prevent imminent or life-threatening deterioration.  Critical care was time spent personally by me on the following activities: development of treatment plan with patient and/or surrogate as well as nursing, discussions with consultants, evaluation of patient's response to treatment, examination of patient, obtaining history from patient or surrogate, ordering and performing treatments and  interventions, ordering and review of laboratory studies, ordering and review of radiographic studies, pulse oximetry and re-evaluation of patient's condition.   Medications Ordered in ED Medications - No data to display  ED Course  I have reviewed the triage vital signs and the nursing notes.  Pertinent labs & imaging results that were available during my care of the patient were reviewed by me and considered in my medical decision making (see chart for details).    MDM Rules/Calculators/A&P                         Lauren [name] is a 86 y.o. female who presented with hypoxia.  Was recently admitted to the hospital for pneumonia.  On her Noncon CT scan, she does have a 4.3 cm thoracic aneurysm.  She is slightly hypertensive and she is hypoxic to 78%.  Consider worsening pneumonia versus dissection versus rupture of the aneurysm.  Plan to get CT dissection study and do sepsis work-up with CBC, CMP, lactate, cultures.  8:37 PM WBC is 16. O2 is 92% on 2L and O2 is 78% on RA per EMS. CTA showed no dissection but multi focal pneumonia and stable aneurysm. Given IV vanc/cefepime. COVID negative. Will admit for hypoxia from multi focal pneumonia    Final Clinical Impression(s) / ED Diagnoses Final diagnoses:  None    Rx / DC Orders ED Discharge Orders    None       Drenda Freeze, MD 12/05/19 2038

## 2019-12-05 NOTE — Progress Notes (Signed)
Pharmacy Antibiotic Note  Lauren [name] is a 86 y.o. female admitted on 12/05/2019 with w/ hx of dementia, HTN, HL, thyroid disease .  Pharmacy has been consulted for cefepime and vancomcyin dosing for HCAP  Plan: Cefepime 2gm IV q24h Vancomycin 1gm IV x 1 then 500mg  q24h Follow renal function, cultures and clinical course     Temp (24hrs), Avg:98.6 F (37 C), Min:98.6 F (37 C), Max:98.6 F (37 C)  Recent Labs  Lab 12/01/19 1811 12/01/19 1811 12/02/19 0525 12/03/19 0502 12/04/19 0756 12/05/19 1708 12/05/19 1720  WBC 9.2  --  8.4 6.3 5.4  --  16.6*  CREATININE 1.35*   < > 1.38* 1.12* 1.07* 1.00 1.11*  LATICACIDVEN  --   --   --  0.9  --   --  1.9   < > = values in this interval not displayed.    Estimated Creatinine Clearance: 27.1 mL/min (A) (by C-G formula based on SCr of 1.11 mg/dL (H)).    Allergies  Allergen Reactions  . Aricept [Donepezil Hcl] Other (See Comments)    Unknown reaction  . Aspirin Other (See Comments)    Upset stomach      Thank you for allowing pharmacy to be a part of this patient's care.  Dolly Rias RPh 12/05/2019, 9:27 PM

## 2019-12-06 ENCOUNTER — Encounter (HOSPITAL_COMMUNITY): Payer: Self-pay | Admitting: Nephrology

## 2019-12-06 ENCOUNTER — Other Ambulatory Visit: Payer: Self-pay

## 2019-12-06 DIAGNOSIS — E039 Hypothyroidism, unspecified: Secondary | ICD-10-CM

## 2019-12-06 DIAGNOSIS — Z789 Other specified health status: Secondary | ICD-10-CM

## 2019-12-06 DIAGNOSIS — R54 Age-related physical debility: Secondary | ICD-10-CM | POA: Diagnosis present

## 2019-12-06 DIAGNOSIS — J9601 Acute respiratory failure with hypoxia: Secondary | ICD-10-CM | POA: Diagnosis present

## 2019-12-06 DIAGNOSIS — I129 Hypertensive chronic kidney disease with stage 1 through stage 4 chronic kidney disease, or unspecified chronic kidney disease: Secondary | ICD-10-CM | POA: Diagnosis present

## 2019-12-06 DIAGNOSIS — Z7189 Other specified counseling: Secondary | ICD-10-CM

## 2019-12-06 DIAGNOSIS — Z8249 Family history of ischemic heart disease and other diseases of the circulatory system: Secondary | ICD-10-CM | POA: Diagnosis not present

## 2019-12-06 DIAGNOSIS — F419 Anxiety disorder, unspecified: Secondary | ICD-10-CM | POA: Diagnosis present

## 2019-12-06 DIAGNOSIS — E890 Postprocedural hypothyroidism: Secondary | ICD-10-CM | POA: Diagnosis present

## 2019-12-06 DIAGNOSIS — N1832 Chronic kidney disease, stage 3b: Secondary | ICD-10-CM | POA: Diagnosis present

## 2019-12-06 DIAGNOSIS — L719 Rosacea, unspecified: Secondary | ICD-10-CM | POA: Diagnosis present

## 2019-12-06 DIAGNOSIS — Z823 Family history of stroke: Secondary | ICD-10-CM | POA: Diagnosis not present

## 2019-12-06 DIAGNOSIS — Z20822 Contact with and (suspected) exposure to covid-19: Secondary | ICD-10-CM | POA: Diagnosis present

## 2019-12-06 DIAGNOSIS — F0150 Vascular dementia without behavioral disturbance: Secondary | ICD-10-CM | POA: Diagnosis not present

## 2019-12-06 DIAGNOSIS — S32010A Wedge compression fracture of first lumbar vertebra, initial encounter for closed fracture: Secondary | ICD-10-CM

## 2019-12-06 DIAGNOSIS — Z888 Allergy status to other drugs, medicaments and biological substances status: Secondary | ICD-10-CM | POA: Diagnosis not present

## 2019-12-06 DIAGNOSIS — M8448XA Pathological fracture, other site, initial encounter for fracture: Secondary | ICD-10-CM | POA: Diagnosis present

## 2019-12-06 DIAGNOSIS — J189 Pneumonia, unspecified organism: Secondary | ICD-10-CM | POA: Diagnosis present

## 2019-12-06 DIAGNOSIS — Z85828 Personal history of other malignant neoplasm of skin: Secondary | ICD-10-CM | POA: Diagnosis not present

## 2019-12-06 DIAGNOSIS — Z515 Encounter for palliative care: Secondary | ICD-10-CM | POA: Diagnosis not present

## 2019-12-06 DIAGNOSIS — Z681 Body mass index (BMI) 19 or less, adult: Secondary | ICD-10-CM | POA: Diagnosis not present

## 2019-12-06 DIAGNOSIS — Z886 Allergy status to analgesic agent status: Secondary | ICD-10-CM | POA: Diagnosis not present

## 2019-12-06 DIAGNOSIS — Z79899 Other long term (current) drug therapy: Secondary | ICD-10-CM | POA: Diagnosis not present

## 2019-12-06 DIAGNOSIS — Z66 Do not resuscitate: Secondary | ICD-10-CM | POA: Diagnosis present

## 2019-12-06 DIAGNOSIS — R5381 Other malaise: Secondary | ICD-10-CM

## 2019-12-06 DIAGNOSIS — W19XXXA Unspecified fall, initial encounter: Secondary | ICD-10-CM | POA: Diagnosis present

## 2019-12-06 DIAGNOSIS — Z79890 Hormone replacement therapy (postmenopausal): Secondary | ICD-10-CM | POA: Diagnosis not present

## 2019-12-06 DIAGNOSIS — F0391 Unspecified dementia with behavioral disturbance: Secondary | ICD-10-CM | POA: Diagnosis present

## 2019-12-06 DIAGNOSIS — Z801 Family history of malignant neoplasm of trachea, bronchus and lung: Secondary | ICD-10-CM | POA: Diagnosis not present

## 2019-12-06 DIAGNOSIS — I10 Essential (primary) hypertension: Secondary | ICD-10-CM | POA: Diagnosis not present

## 2019-12-06 DIAGNOSIS — F329 Major depressive disorder, single episode, unspecified: Secondary | ICD-10-CM | POA: Diagnosis present

## 2019-12-06 DIAGNOSIS — Y95 Nosocomial condition: Secondary | ICD-10-CM | POA: Diagnosis present

## 2019-12-06 DIAGNOSIS — R531 Weakness: Secondary | ICD-10-CM

## 2019-12-06 DIAGNOSIS — S22070D Wedge compression fracture of T9-T10 vertebra, subsequent encounter for fracture with routine healing: Secondary | ICD-10-CM

## 2019-12-06 DIAGNOSIS — E785 Hyperlipidemia, unspecified: Secondary | ICD-10-CM | POA: Diagnosis present

## 2019-12-06 LAB — CBC
HCT: 33.6 % — ABNORMAL LOW (ref 36.0–46.0)
Hemoglobin: 10.9 g/dL — ABNORMAL LOW (ref 12.0–15.0)
MCH: 30.7 pg (ref 26.0–34.0)
MCHC: 32.4 g/dL (ref 30.0–36.0)
MCV: 94.6 fL (ref 80.0–100.0)
Platelets: 164 10*3/uL (ref 150–400)
RBC: 3.55 MIL/uL — ABNORMAL LOW (ref 3.87–5.11)
RDW: 15.2 % (ref 11.5–15.5)
WBC: 12.6 10*3/uL — ABNORMAL HIGH (ref 4.0–10.5)
nRBC: 0 % (ref 0.0–0.2)

## 2019-12-06 LAB — LEGIONELLA PNEUMOPHILA SEROGP 1 UR AG: L. pneumophila Serogp 1 Ur Ag: NEGATIVE

## 2019-12-06 MED ORDER — ACETAMINOPHEN 325 MG PO TABS
650.0000 mg | ORAL_TABLET | Freq: Three times a day (TID) | ORAL | Status: DC
  Administered 2019-12-06 – 2019-12-08 (×4): 650 mg via ORAL
  Filled 2019-12-06 (×7): qty 2

## 2019-12-06 MED ORDER — ACETAMINOPHEN 325 MG PO TABS
650.0000 mg | ORAL_TABLET | Freq: Three times a day (TID) | ORAL | Status: DC
  Administered 2019-12-06: 650 mg via ORAL
  Filled 2019-12-06: qty 2

## 2019-12-06 MED ORDER — OXYCODONE HCL 5 MG PO TABS
5.0000 mg | ORAL_TABLET | Freq: Three times a day (TID) | ORAL | Status: DC | PRN
  Administered 2019-12-07: 5 mg via ORAL
  Filled 2019-12-06: qty 1

## 2019-12-06 MED ORDER — ACETAMINOPHEN 650 MG RE SUPP: 650.0000 mg | Freq: Three times a day (TID) | RECTAL | Status: DC

## 2019-12-06 MED ORDER — ACETAMINOPHEN 500 MG PO TABS: 500.0000 mg | ORAL_TABLET | Freq: Three times a day (TID) | ORAL | 0 refills | Status: AC

## 2019-12-06 MED ORDER — AMOXICILLIN-POT CLAVULANATE 500-125 MG PO TABS
1.0000 | ORAL_TABLET | Freq: Two times a day (BID) | ORAL | Status: DC
  Administered 2019-12-07 – 2019-12-09 (×4): 500 mg via ORAL
  Filled 2019-12-06 (×7): qty 1

## 2019-12-06 NOTE — Progress Notes (Signed)
Welcome kit and handbook have been given to patient.

## 2019-12-06 NOTE — Plan of Care (Signed)

## 2019-12-06 NOTE — TOC Initial Note (Signed)
Transition of Care [facility]) - Initial/Assessment Note    Patient Details  Name: Lauren [name] MRN: [id number] Date of Birth: [birth year]  [facility] CM/SW Contact:    Lennart Pall, LCSW Phone Number: 12/06/2019, 2:55 PM  Clinical Narrative:                 Met with pt's daughter this afternoon to review dc plans and discuss return to Memory Care at [facility].  Daughter with concerns about pt's new need for oxygen and increased physical assistance and whether this can be managed at [facility].  I have spoken with the Licensed conveyancer, [place], at [facility] and she does NOT feel they can meet current care needs.  Daughter aware and agreed to change in plan to SNF for rehab.  Have alerted MD as well and will begin SNF bed search.   Expected Discharge Plan: Skilled Nursing Facility Barriers to Discharge: Continued Medical Work up   Patient Goals and CMS Choice   CMS Medicare.gov Compare Post Acute Care list provided to:: Patient Choice offered to / list presented to : Adult Children  Expected Discharge Plan and Services Expected Discharge Plan: [place] In-house Referral: Clinical Social Work   Post Acute Care Choice: [place] Living arrangements for the past 2 months: [place] (Memory care) Expected Discharge Date: 12/06/19               DME Arranged: N/A DME Agency: NA       HH Arranged: NA [place] Agency: NA        Prior Living Arrangements/Services Living arrangements for the past 2 months: [place] (Memory care) Lives with:: Facility Resident Patient language and need for interpreter reviewed:: Yes Do you feel safe going back to the place where you live?: No   daughter does not feel comfortable with pt returning to prior level of care - feels SNF is needed  Need for Family Participation in Patient Care: Yes (Comment) Care giver support system in place?: Yes (comment)   Criminal Activity/Legal Involvement  Pertinent to Current Situation/Hospitalization: No - Comment as needed  Activities of Daily Living Home Assistive Devices/Equipment: Eyeglasses ADL Screening (condition at time of admission) Patient's cognitive ability adequate to safely complete daily activities?: No Is the patient deaf or have difficulty hearing?: No Does the patient have difficulty seeing, even when wearing glasses/contacts?: No Does the patient have difficulty concentrating, remembering, or making decisions?: Yes Patient able to express need for assistance with ADLs?: Yes Does the patient have difficulty dressing or bathing?: No Independently performs ADLs?: No Communication: Independent Dressing (OT): Independent Grooming: Independent Feeding: Independent Bathing: Independent Toileting: Needs assistance Is this a change from baseline?: Pre-admission baseline In/Out Bed: Needs assistance Is this a change from baseline?: Pre-admission baseline Walks in Home: Needs assistance Is this a change from baseline?: Pre-admission baseline Does the patient have difficulty walking or climbing stairs?: Yes Weakness of Legs: Both Weakness of Arms/Hands: Both  Permission Sought/Granted Permission sought to share information with : Family Supports, Chartered certified accountant granted to share information with : Yes, Verbal Permission Granted  Share Information with NAME: Josalyn Dettmann     Permission granted to share info w Relationship: daughter  Permission granted to share info w Contact Information: [phone]  Emotional Assessment Appearance:: Appears stated age Attitude/Demeanor/Rapport: Unable to Assess Affect (typically observed): Quiet Orientation: : Oriented to Self   Psych Involvement: No (comment)  Admission diagnosis:  Hypoxia [R09.02] HCAP (  healthcare-associated pneumonia) [J18.9] Acute hypoxemic respiratory failure ([place]) [J96.01] Patient Active Problem List   Diagnosis Date Noted  .  Acute respiratory failure with hypoxemia ([place]) 12/05/2019  . Acute hypoxemic respiratory failure ([place]) 12/05/2019  . HCAP (healthcare-associated pneumonia) 12/02/2019  . CAP (community acquired pneumonia) 12/01/2019  . Respiratory failure ([place]) 12/01/2019  . Hypotension 12/01/2019  . CKD (chronic kidney disease) stage 3, GFR 30-59 ml/min 09/01/2017  . Osteoporosis 10/21/2016  . Weight gain 04/16/2016  . Hypocalcemia 04/16/2016  . Hypothyroidism 04/16/2016  . Edema of right foot 04/16/2016  . Dementia ([place]) 10/31/2015  . 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 Loss of weight 03/24/2014  . Hypertension   . High cholesterol   . Anxiety    PCP:  Patient, No Pcp Per Pharmacy:  No Pharmacies Listed    Social Determinants of Health (SDOH) Interventions    Readmission Risk Interventions No flowsheet data found.

## 2019-12-06 NOTE — Evaluation (Signed)
SLP Cancellation Note  Patient Details Name: LYNNOX GIRTEN MRN: [id number] DOB: 12-Dec-1924   Cancelled treatment:       Reason Eval/Treat Not Completed: Other (comment) (pt was for dc and now is on hold due to SNF needs, secure chatted RN when pt to dc who reports pt is doing fine with medication with puree- will continue efforts tomorrow am to see pt. thanks)  Kathleen Lime, MS [facility] SLP Acute Rehab Services Office [phone]   Macario Golds 12/06/2019, 4:19 PM

## 2019-12-06 NOTE — Plan of Care (Signed)

## 2019-12-06 NOTE — Progress Notes (Signed)
PROGRESS NOTE  Lauren [name] YYT:[id number] DOB: 04/13/1924   PCP: Patient, No Pcp Per  [name]  DOA: 12/05/2019 LOS: 0  Brief Narrative / Interim history: 86 year old female with history of dementia, HTN, HLD, hypothyroidism, thoracic vertebral compression fracture and recent hospitalization from 9/15-9/18 due to multifocal pneumonia returning after fall and hypoxemia.  Unclear mechanism or cause of fall.  Per EMS, was hypoxic to 70s when EMS arrived.   In ED, 95% on room air but desaturated to 89% later requiring supplemental oxygen.  RR ranged from 13-31.  Otherwise vital signs within appropriate range.  WBC 16.6. Cr 1.11 (about baseline).  BUN 14.  AST 49.  Total bili 1.4.  CTA chest revealed known changes from multifocal pneumonia, unstable T10 fracture but negative for PE.  Blood cultures obtained.  She was started on broad-spectrum antibiotics and admitted.  The next day, patient felt well.  Leukocytosis improving.  Antibiotic de-escalated her to home Augmentin to complete treatment course for her multifocal pneumonia.  Patient desaturated to 86% when weaned off oxygen requiring 2 L to recover to 94%.   Subjective: Seen and examined earlier this morning.  No major events overnight of this morning.  No complaints but not a great historian.  She responds no to pain, shortness of breath, nausea, vomiting or abdominal pain.  She is oriented to self, place and daughter.  She thinks she is in the hospital because "I lost my mind went wondering on street".   Objective: Vitals:   12/06/19 0311 12/06/19 0647 12/06/19 1052 12/06/19 1134  BP: 135/67 120/60 (!) 148/74   Pulse: 70 66 72   Resp: (!) 24 (!) 22 15   Temp: 98.8 F (37.1 C) 98.6 F (37 C) 98.6 F (37 C)   TempSrc: Oral Oral Oral   SpO2: 91% 95% 94% 95%  Weight:      Height:        Intake/Output Summary (Last 24 hours) at 12/06/2019 1501 Last data filed at 12/06/2019 1000 Gross  per 24 hour  Intake 308.03 ml  Output 250 ml  Net 58.03 ml   Filed Weights   12/05/19 2300  Weight: 53.6 kg    Examination:  GENERAL: No apparent distress.  Nontoxic. HEENT: MMM.  Vision and hearing grossly intact.  NECK: Supple.  No apparent JVD.  RESP: 95% on 3 L.  No IWOB.  Fair aeration bilaterally. CVS:  RRR. Heart sounds normal.  ABD/GI/GU: BS+. Abd soft, NTND.  MSK/EXT:  Moves extremities. No apparent deformity. No edema.  SKIN: no apparent skin lesion or wound NEURO: Awake and alert.  Oriented to self, place and daughter.  No apparent focal neuro deficit. PSYCH: Calm. Normal affect.  No distress or agitation.  Procedures:  None  Microbiology summarized: COVID-19 PCR negative. Blood cultures pending.  Assessment & Plan: Acute hypoxemic respiratory failure: Likely from recent multifocal pneumonia and deconditioning.  CTA chest negative for PE. Could have some degree of atelectasis due to pain with deep breathing from compression fracture although she is not in pain now.  Desaturated to 86% at rest when attempted to wean requiring 2 L to maintain appropriate saturation. -Continue p.o. Augmentin to finish course through 9/25 -Encourage incentive spirometry/OOB/PT/OT  Stable T10 compression fracture New L1 compression fracture with 10% vertebral body height loss -Pain-free without pain medications so far -Scheduled Tylenol 650 mg every 8 hours -Continue calcium and vitamin D  Dementia without behavioral disturbance: Stable.  She  is oriented to self, place and person. -Reorientation and delirium precautions. -Continue home memantine  Anxiety and depression: Stable. -Continue home medications  Hypothyroidism: Stable. -Continue home medications  Recent multifocal pneumonia: Stable. -Continue home Augmentin to complete treatment course  Essential hypertension: Normotensive. -Continue home medications  CKD-3B: At baseline.  Debility/physical  deconditioning: Per daughter, patient was able to ambulate with walker prior to recent hospitalization.  Significant change from her baseline. -PT/OT recommended SNF  GOC/DNR/DNI-extensive discussion with patient's daughter at bedside.  Daughter is open to hospice referral if she does not improve after completed treatment for pneumonia and trial of therapy. -Will place referral to hospice on discharge to follow-up patient at SNF.   Body mass index is 17.97 kg/m.         DVT prophylaxis:  enoxaparin (LOVENOX) injection 30 mg Start: 12/06/19 1000  Code Status: DNR/DNI Family Communication: Updated patient's daughter at bedside. Status is: Observation  Discharge barrier: Hypoxemia requiring supplemental oxygen, and safe disposition given significantly decreased activity tolerance, impaired gait and balance issues  Dispo: The patient is from: ALF              Anticipated d/c is to: SNF              Anticipated d/c date is: 2 days              Patient currently is not medically stable to d/c.       Consultants:  None   Sch Meds:  Scheduled Meds: . acetaminophen  650 mg Oral Q8H   Or  . acetaminophen  650 mg Rectal Q8H  . amoxicillin-clavulanate  1 tablet Oral BID  . atorvastatin  5 mg Oral Daily  . buPROPion  150 mg Oral Daily  . calcium-vitamin D  1 tablet Oral Daily  . enoxaparin (LOVENOX) injection  30 mg Subcutaneous Q24H  . guaiFENesin  600 mg Oral BID  . levothyroxine  88 mcg Oral Q0600  . memantine  14 mg Oral Daily  . metoprolol tartrate  50 mg Oral BID  . mirtazapine  15 mg Oral QHS  . saccharomyces boulardii  250 mg Oral BID   Continuous Infusions: . sodium chloride Stopped (12/06/19 0200)   PRN Meds:.oxyCODONE  Antimicrobials: Anti-infectives (From admission, onward)   Start     Dose/Rate Route Frequency Ordered Stop   12/06/19 2200  vancomycin (VANCOREADY) IVPB 500 mg/100 mL  Status:  Discontinued        500 mg 100 mL/hr over 60 Minutes  Intravenous Every 24 hours 12/05/19 2128 12/06/19 1124   12/06/19 2000  ceFEPIme (MAXIPIME) 2 g in sodium chloride 0.9 % 100 mL IVPB  Status:  Discontinued        2 g 200 mL/hr over 30 Minutes Intravenous Every 24 hours 12/05/19 2128 12/06/19 1124   12/06/19 1800  amoxicillin-clavulanate (AUGMENTIN) 500-125 MG per tablet 500 mg        1 tablet Oral 2 times daily 12/06/19 1124     12/05/19 2000  vancomycin (VANCOCIN) IVPB 1000 mg/200 mL premix        1,000 mg 200 mL/hr over 60 Minutes Intravenous  Once 12/05/19 1956 12/05/19 2233   12/05/19 2000  ceFEPIme (MAXIPIME) 2 g in sodium chloride 0.9 % 100 mL IVPB        2 g 200 mL/hr over 30 Minutes Intravenous  Once 12/05/19 1956 12/05/19 2119       I have personally reviewed the following labs and images:  CBC: Recent Labs  Lab 12/01/19 1231 12/01/19 1811 12/02/19 0525 12/02/19 0525 12/03/19 0502 12/04/19 0756 12/05/19 1708 12/05/19 1720 12/06/19 0338  WBC 8.0   < > 8.4  --  6.3 5.4  --  16.6* 12.6*  NEUTROABS 5.6  --   --   --  3.8 3.3  --  13.8*  --   HGB 10.5*   < > 10.4*   < > 10.4* 11.1* 11.6* 12.2 10.9*  HCT 32.8*   < > 32.6*   < > 33.3* 35.4* 34.0* 38.2 33.6*  MCV 94.8   < > 95.3  --  95.4 95.4  --  93.9 94.6  PLT 156   < > 145*  --  159 161  --  177 164   < > = values in this interval not displayed.   BMP &GFR Recent Labs  Lab 12/01/19 1231 12/01/19 1811 12/02/19 0525 12/03/19 0502 12/04/19 0756 12/05/19 1708 12/05/19 1720  NA 145  --  141 140 141 141 140  K 4.0  --  3.9 3.5 3.6 5.1 4.9  CL 105  --  106 106 105 104 104  CO2 28  --  24 25 25   --  24  GLUCOSE 118*  --  128* 91 93 115* 116*  BUN 27*  --  27* 19 15 17 14   CREATININE 1.43*   < > 1.38* 1.12* 1.07* 1.00 1.11*  CALCIUM 8.4*  --  8.1* 7.9* 8.2*  --  8.6*  MG  --   --   --   --  1.7  --   --    < > = values in this interval not displayed.   Estimated Creatinine Clearance: 26.8 mL/min (A) (by C-G formula based on SCr of 1.11 mg/dL (H)). Liver &  Pancreas: Recent Labs  Lab 12/01/19 1231 12/05/19 1720  AST 19 49*  ALT 10 15  ALKPHOS 63 76  BILITOT 0.7 1.4*  PROT 5.7* 6.7  ALBUMIN 2.8* 3.1*   No results for input(s): LIPASE, AMYLASE in the last 168 hours. No results for input(s): AMMONIA in the last 168 hours. Diabetic: No results for input(s): HGBA1C in the last 72 hours. Recent Labs  Lab 12/03/19 1146  GLUCAP 89   Cardiac Enzymes: No results for input(s): CKTOTAL, CKMB, CKMBINDEX, TROPONINI in the last 168 hours. No results for input(s): PROBNP in the last 8760 hours. Coagulation Profile: No results for input(s): INR, PROTIME in the last 168 hours. Thyroid Function Tests: No results for input(s): TSH, T4TOTAL, FREET4, T3FREE, THYROIDAB in the last 72 hours. Lipid Profile: No results for input(s): CHOL, HDL, LDLCALC, TRIG, CHOLHDL, LDLDIRECT in the last 72 hours. Anemia Panel: No results for input(s): VITAMINB12, FOLATE, FERRITIN, TIBC, IRON, RETICCTPCT in the last 72 hours. Urine analysis:    Component Value Date/Time   COLORURINE YELLOW 10/11/2015 1250   APPEARANCEUR CLEAR 10/11/2015 1250   LABSPEC 1.016 10/11/2015 1250   PHURINE 5.5 10/11/2015 1250   GLUCOSEU NEGATIVE 10/11/2015 1250   HGBUR SMALL (A) 10/11/2015 1250   BILIRUBINUR NEGATIVE 10/11/2015 1250   KETONESUR NEGATIVE 10/11/2015 1250   PROTEINUR NEGATIVE 10/11/2015 1250   UROBILINOGEN 0.2 02/12/2014 1015   NITRITE NEGATIVE 10/11/2015 1250   LEUKOCYTESUR NEGATIVE 10/11/2015 1250   Sepsis Labs: Invalid input(s): PROCALCITONIN, [place]  Microbiology: Recent Results (from the past 240 hour(s))  SARS Coronavirus 2 by RT PCR (hospital order, performed in [facility] hospital lab) Nasopharyngeal Nasopharyngeal Swab     Status: None  Collection Time: 12/01/19 12:40 PM   Specimen: Nasopharyngeal Swab  Result Value Ref Range Status   SARS Coronavirus 2 NEGATIVE NEGATIVE Final    Comment: (NOTE) SARS-CoV-2 target nucleic acids are NOT  DETECTED.  The SARS-CoV-2 RNA is generally detectable in upper and lower respiratory specimens during the acute phase of infection. The lowest concentration of SARS-CoV-2 viral copies this assay can detect is 250 copies / mL. A negative result does not preclude SARS-CoV-2 infection and should not be used as the sole basis for treatment or other patient management decisions.  A negative result may occur with improper specimen collection / handling, submission of specimen other than nasopharyngeal swab, presence of viral mutation(s) within the areas targeted by this assay, and inadequate number of viral copies (<250 copies / mL). A negative result must be combined with clinical observations, patient history, and epidemiological information.  Fact Sheet for Patients:   StrictlyIdeas.no  Fact Sheet for Healthcare Providers: BankingDealers.co.za  This test is not yet approved or  cleared by the [place] FDA and has been authorized for detection and/or diagnosis of SARS-CoV-2 by FDA under an Emergency Use Authorization (EUA).  This EUA will remain in effect (meaning this test can be used) for the duration of the COVID-19 declaration under Section 564(b)(1) of the Act, 21 U.S.C. section 360bbb-3(b)(1), unless the authorization is terminated or revoked sooner.  Performed at [facility], [place] [address] [place], [place] [address]   Respiratory Panel by PCR     Status: None   Collection Time: 12/01/19  6:55 PM   Specimen: Nasopharyngeal Swab; Respiratory  Result Value Ref Range Status   Adenovirus NOT DETECTED NOT DETECTED Final   Coronavirus 229E NOT DETECTED NOT DETECTED Final    Comment: (NOTE) The Coronavirus on the Respiratory Panel, DOES NOT test for the novel  Coronavirus (2019 nCoV)    Coronavirus HKU1 NOT DETECTED NOT DETECTED Final   Coronavirus NL63 NOT DETECTED NOT DETECTED Final   Coronavirus OC43 NOT  DETECTED NOT DETECTED Final   Metapneumovirus NOT DETECTED NOT DETECTED Final   Rhinovirus / Enterovirus NOT DETECTED NOT DETECTED Final   Influenza A NOT DETECTED NOT DETECTED Final   Influenza B NOT DETECTED NOT DETECTED Final   Parainfluenza Virus 1 NOT DETECTED NOT DETECTED Final   Parainfluenza Virus 2 NOT DETECTED NOT DETECTED Final   Parainfluenza Virus 3 NOT DETECTED NOT DETECTED Final   Parainfluenza Virus 4 NOT DETECTED NOT DETECTED Final   Respiratory Syncytial Virus NOT DETECTED NOT DETECTED Final   Bordetella pertussis NOT DETECTED NOT DETECTED Final   Chlamydophila pneumoniae NOT DETECTED NOT DETECTED Final   Mycoplasma pneumoniae NOT DETECTED NOT DETECTED Final    Comment: Performed at [facility], [place]. [address] [place], [place] [address]  MRSA PCR Screening     Status: None   Collection Time: 12/02/19 12:30 AM   Specimen: Nasal Mucosa; Nasopharyngeal  Result Value Ref Range Status   MRSA by PCR NEGATIVE NEGATIVE Final    Comment:        The GeneXpert MRSA Assay (FDA approved for NASAL specimens only), is one component of a comprehensive MRSA colonization surveillance program. It is not intended to diagnose MRSA infection nor to guide or monitor treatment for MRSA infections. Performed at [facility], [place] [address] [place], [place] [address]   Culture, blood (routine x 2)     Status: None (Preliminary result)   Collection Time: 12/02/19  3:03 PM  Specimen: BLOOD  Result Value Ref Range Status   Specimen Description   Final    BLOOD RIGHT ANTECUBITAL Performed at [place] [address] [place], [place] [address]    Special Requests   Final    BOTTLES DRAWN AEROBIC AND ANAEROBIC Blood Culture adequate volume Performed at [place] [address], [place], [place] [address]    Culture   Final    NO GROWTH 3 DAYS Performed at [facility], [place] [address] [place], [place]  [address]    Report Status PENDING  Incomplete  Culture, blood (routine x 2)     Status: None (Preliminary result)   Collection Time: 12/02/19  3:03 PM   Specimen: BLOOD LEFT HAND  Result Value Ref Range Status   Specimen Description   Final    BLOOD LEFT HAND Performed at [place] [address] [place], [place] [address]    Special Requests   Final    BOTTLES DRAWN AEROBIC ONLY Blood Culture results may not be optimal due to an excessive volume of blood received in culture bottles Performed at [place] [address] [place], [place] [address]    Culture   Final    NO GROWTH 3 DAYS Performed at [facility], [place] [address] [place], [place] [address]    Report Status PENDING  Incomplete  Blood culture (routine x 2)     Status: None (Preliminary result)   Collection Time: 12/05/19  5:07 PM   Specimen: BLOOD  Result Value Ref Range Status   Specimen Description   Final    BLOOD RIGHT ANTECUBITAL Performed at [facility], [place] [address] [place], [place] [address]    Special Requests   Final    BOTTLES DRAWN AEROBIC AND ANAEROBIC Blood Culture adequate volume Performed at [place] [address] [place], [place] [address]    Culture PENDING  Incomplete   Report Status PENDING  Incomplete  Blood culture (routine x 2)     Status: None (Preliminary result)   Collection Time: 12/05/19  5:08 PM   Specimen: BLOOD  Result Value Ref Range Status   Specimen Description   Final    BLOOD LEFT ANTECUBITAL Performed at [facility], [place] [address], [place], [place] [address]    Special Requests   Final    BOTTLES DRAWN AEROBIC AND ANAEROBIC Blood Culture adequate volume Performed at [place] [address] [place], [place] [address]    Culture PENDING  Incomplete   Report Status PENDING  Incomplete  SARS Coronavirus 2 by RT PCR (hospital order, performed in [facility]  hospital lab) Nasopharyngeal Nasopharyngeal Swab     Status: None   Collection Time: 12/05/19  5:22 PM   Specimen: Nasopharyngeal Swab  Result Value Ref Range Status   SARS Coronavirus 2 NEGATIVE NEGATIVE Final    Comment: (NOTE) SARS-CoV-2 target nucleic acids are NOT DETECTED.  The SARS-CoV-2 RNA is generally detectable in upper and lower respiratory specimens during the acute phase of infection. The lowest concentration of SARS-CoV-2 viral copies this assay can detect is 250 copies / mL. A negative result does not preclude SARS-CoV-2 infection and should not be used as the sole basis for treatment or other patient management decisions.  A negative result may occur with improper specimen collection / handling, submission of specimen other than nasopharyngeal swab, presence of viral mutation(s)  within the areas targeted by this assay, and inadequate number of viral copies (<250 copies / mL). A negative result must be combined with clinical observations, patient history, and epidemiological information.  Fact Sheet for Patients:   StrictlyIdeas.no  Fact Sheet for Healthcare Providers: BankingDealers.co.za  This test is not yet approved or  cleared by the [place] FDA and has been authorized for detection and/or diagnosis of SARS-CoV-2 by FDA under an Emergency Use Authorization (EUA).  This EUA will remain in effect (meaning this test can be used) for the duration of the COVID-19 declaration under Section 564(b)(1) of the Act, 21 U.S.C. section 360bbb-3(b)(1), unless the authorization is terminated or revoked sooner.  Performed at [facility], [place] [address] [place], [place] [address]     Radiology Studies: [facility] Chest Port 1 View  Result Date: 12/05/2019 CLINICAL DATA:  Shortness of breath. Fall. Recently treated for pneumonia. EXAM: PORTABLE CHEST 1 VIEW COMPARISON:  Chest x-ray December 01, 2019. Chest  CT December 02, 2019. FINDINGS: Multifocal infiltrates, left greater than right, are similar on the left in the interval. The infiltrate on the right is mildly increased at the base. No pneumothorax. No other changes. IMPRESSION: 1. Persistent multifocal infiltrates, stable on the left and mildly increased on the right in the interval most consistent with multifocal pneumonia given recent CT imaging. No other interval changes. Electronically Signed   By: Dorise Bullion III M.D   On: 12/05/2019 17:03   CT Angio Chest/Abd/Pel for Dissection W and/or Wo Contrast  Result Date: 12/05/2019 CLINICAL DATA:  Chest pain and lower back pain, history of thoracic aortic aneurysm. EXAM: CT ANGIOGRAPHY CHEST, ABDOMEN AND PELVIS TECHNIQUE: Non-contrast CT of the chest was initially obtained. Multidetector CT imaging through the chest, abdomen and pelvis was performed using the standard protocol during bolus administration of intravenous contrast. Multiplanar reconstructed images and MIPs were obtained and reviewed to evaluate the vascular anatomy. CONTRAST:  51mL OMNIPAQUE IOHEXOL 350 MG/ML SOLN COMPARISON:  Noncontrast study from 12/02/2019 FINDINGS: CTA CHEST FINDINGS Cardiovascular: Initial precontrast images again demonstrate atherosclerotic calcification and aneurysmal dilatation of the ascending aorta to 4.7 cm, stable in appearance from the prior exam. No evidence of dissection is seen. 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 No significant cardiac enlargement is seen. Coronary calcifications are again seen. No pericardial effusion is noted. No sizable pulmonary embolus is seen. Mediastinum/Nodes: Thoracic inlet is within normal limits. No sizable hilar or mediastinal adenopathy is identified. The esophagus as visualized is within normal limits. Lungs/Pleura: Small left-sided pleural effusion is again seen and stable. Diffuse bilateral patchy airspace opacities are noted similar to that seen on the prior exam  consistent with significant bilateral multilobar pneumonia. No new focal infiltrate is seen. No sizable pneumothorax is noted. Calcified pleural plaques are again noted and stable. Musculoskeletal: Degenerative changes of the thoracic spine are noted. Stable T10 compression deformity is seen. No definitive acute rib abnormality is seen. Review of the MIP images confirms the above findings. CTA ABDOMEN AND PELVIS FINDINGS VASCULAR Aorta: Abdominal aorta demonstrates diffuse atherosclerotic calcification without aneurysmal dilatation or dissection. Celiac: Patent without evidence of aneurysm, dissection, vasculitis or significant stenosis. SMA: Patent without evidence of aneurysm, dissection, vasculitis or significant stenosis. Renals: Dual renal arteries are noted on the left with a single renal artery on the right. No significant narrowing is noted. IMA: Patent without evidence of aneurysm, dissection, vasculitis or significant stenosis. Iliacs: Diffuse atherosclerotic changes are noted without focal stenosis or aneurysmal dilatation.  Veins: No specific venous abnormality is seen. Review of the MIP images confirms the above findings. NON-VASCULAR Hepatobiliary: No focal liver abnormality is seen. Status post cholecystectomy. No biliary dilatation. Pancreas: Unremarkable. No pancreatic ductal dilatation or surrounding inflammatory changes. Spleen: Normal in size without focal abnormality. Adrenals/Urinary Tract: Adrenal glands are within normal limits. Kidneys demonstrate scattered cysts bilaterally. No obstructive changes are seen. No renal calculi are noted. The bladder is well distended. Stomach/Bowel: Scattered diverticular change of the colon is noted. The appendix is not well visualized although no inflammatory changes to suggest appendicitis are noted. No obstructive or inflammatory changes of the colon are seen. Small bowel and stomach appear within normal limits. Lymphatic: No sizable lymphadenopathy is  noted. Reproductive: Uterus and bilateral adnexa are unremarkable. Other: No abdominal wall hernia or abnormality. No abdominopelvic ascites. Musculoskeletal: Mild L1 compression deformity is noted new from the previous exam. There is approximately 10% vertebral body height loss. No involvement of the posterior elements is seen. Scattered degenerative changes are noted. Old healed pubic rami fractures are noted bilaterally. Review of the MIP images confirms the above findings. IMPRESSION: CTA of the chest: No acute dissection is noted. Stable aneurysmal dilatation of the aorta to 4.7 cm is noted. Ascending thoracic aortic aneurysm. Recommend semi-annual imaging followup by CTA or MRA and referral to cardiothoracic surgery if not already obtained. This recommendation follows 2010 ACCF/AHA/AATS/ACR/ASA/SCA/SCAI/SIR/STS/SVM Guidelines for the Diagnosis and Management of Patients With Thoracic Aortic Disease. Circulation. 2010; 121: G269-S854. Aortic aneurysm NOS (ICD10-I71.9) No evidence of pulmonary emboli. Patchy multifocal pneumonia bilaterally similar to that seen on the prior exam from 3 days previous. CTA of the abdomen and pelvis: No evidence of abdominal aortic aneurysm or dissection is noted. New L1 compression deformity is noted.  Mild height loss is noted. Diverticulosis without diverticulitis. Stable renal cysts bilaterally. Electronically Signed   By: Inez Catalina M.D.   On: 12/05/2019 19:39      [place] [address]  If 7PM-7AM, please contact night-coverage [email or web address] 12/06/2019, 3:01 PM

## 2019-12-06 NOTE — Evaluation (Signed)
Physical Therapy Evaluation Patient Details Name: Lauren [name] MRN: [id number] DOB: 05-20-1924 Today's Date: 12/06/2019    SATURATION QUALIFICATIONS: (This note is used to comply with regulatory documentation for home oxygen)  Patient Saturations on Room Air at Rest = 86%  Patient Saturations on [employer] while Ambulating = n/a  Patient Saturations on 2 Liters of oxygen while Ambulating = 87%    History of Present Illness  86 yo female admitted with acute respiratory failure and L1 comp fx after falling at memory care facility. Hx of dementia.  Clinical Impression  On eval, pt required Min assist for mobility. She walked ~15 feet x 2 with a RW. Pt presents with general weakness, decreased activity tolerance, and impaired gait and balance. She remains at risk for falls when mobilizing. Pt denied pain with activity. Pt followed commands well. Daughter was present during session. Daughter expresses concern about d/c plan. She is under the impression that pt may not be able to return to memory care facility if pt is on O2. Encouraged her to consult with [facility] team so we can get the appropriate d/c plan in place. Made MD aware during secure chat. Will plan to follow and progress activity as tolerated. If pt does not go to SNF, recommend HHPT and 24 hour supervision/assist.     Follow Up Recommendations SNF;Supervision/Assistance - 24 hour (per daughter, pt may not be able to return to memory care with O2)    Equipment Recommendations  None recommended by PT    Recommendations for Other Services OT consult     Precautions / Restrictions Precautions Precautions: Fall Precaution Comments: monitor O2 Restrictions Weight Bearing Restrictions: No      Mobility  Bed Mobility Overal bed mobility: Needs Assistance Bed Mobility: Supine to Sit;Sit to Supine     Supine to sit: Min assist Sit to supine: Min assist   General bed mobility comments: Assist to get in and out of bed. Assist for  trunk and bil LEs. Increased time. Cues required. O2 86% on RA at rest.  Transfers Overall transfer level: Needs assistance Equipment used: Rolling walker (2 wheeled) Transfers: Sit to/from Stand Sit to Stand: Min assist         General transfer comment: Assist to power up, stabilize, control descent. VCs safety, technique, hand placement. Increased time. Mild posterior bias.  Ambulation/Gait Ambulation/Gait assistance: Min assist Gait Distance (Feet): 15 Feet (x2) Assistive device: Rolling walker (2 wheeled) Gait Pattern/deviations: Step-through pattern;Decreased stride length     General Gait Details: Assist to stabilize pt throughout short distance and to manage with RW. Unsteady and at risk for falls. O2 86% on 2L [place] O2.  Stairs            Wheelchair Mobility    Modified Rankin (Stroke Patients Only)       Balance Overall balance assessment: Needs assistance;History of Falls         Standing balance support: Bilateral upper extremity supported Standing balance-Leahy Scale: Poor                               Pertinent Vitals/Pain Pain Assessment: No/denies pain    Home Living                   Additional Comments: memory care unit    Prior Function Level of Independence: Independent with assistive device(s)         Comments: uses rollator  for ambulation per daughter     Hand Dominance        Extremity/Trunk Assessment   Upper Extremity Assessment Upper Extremity Assessment: Defer to OT evaluation    Lower Extremity Assessment Lower Extremity Assessment: Generalized weakness    Cervical / Trunk Assessment Cervical / Trunk Assessment: Kyphotic  Communication   Communication: No difficulties  Cognition Arousal/Alertness: Awake/alert Behavior During Therapy: Flat affect Overall Cognitive Status: History of cognitive impairments - at baseline                                        General  Comments      Exercises     Assessment/Plan    PT Assessment Patient needs continued PT services  PT Problem List Decreased strength;Decreased mobility;Decreased activity tolerance;Decreased balance;Decreased knowledge of use of DME;Decreased cognition;Decreased safety awareness       PT Treatment Interventions DME instruction;Therapeutic activities;Gait training;Therapeutic exercise;Functional mobility training;Balance training;Patient/family education    PT Goals (Current goals can be found in the Care Plan section)  Acute Rehab PT Goals Patient Stated Goal: per daughter, to have appropriate d/c plan PT Goal Formulation: With patient Time For Goal Achievement: 12/20/19 Potential to Achieve Goals: Fair    Frequency Min 3X/week   Barriers to discharge Decreased caregiver support per daughter, may not be able to return to memory care with O2    Co-evaluation               AM-PAC PT "6 Clicks" Mobility  Outcome Measure Help needed turning from your back to your side while in a flat bed without using bedrails?: A Little Help needed moving from lying on your back to sitting on the side of a flat bed without using bedrails?: A Little Help needed moving to and from a bed to a chair (including a wheelchair)?: A Little Help needed standing up from a chair using your arms (e.g., wheelchair or bedside chair)?: A Little Help needed to walk in hospital room?: A Little Help needed climbing 3-5 steps with a railing? : A Lot 6 Click Score: 17    End of Session Equipment Utilized During Treatment: Oxygen;Gait belt Activity Tolerance: Patient tolerated treatment well Patient left: in bed;with call bell/phone within reach;with bed alarm set   PT Visit Diagnosis: History of falling (Z91.81);Difficulty in walking, not elsewhere classified (R26.2);Muscle weakness (generalized) (M62.81);Unsteadiness on feet (R26.81)    Time: 3664-4034 PT Time Calculation (min) (ACUTE ONLY): 40  min   Charges:   PT Evaluation $PT Eval Low Complexity: 1 Low PT Treatments $Gait Training: 8-22 mins $Therapeutic Activity: 8-22 mins           Doreatha Massed, PT Acute Rehabilitation  Office: [phone] Pager: [phone]

## 2019-12-07 DIAGNOSIS — R338 Other retention of urine: Secondary | ICD-10-CM

## 2019-12-07 LAB — CULTURE, BLOOD (ROUTINE X 2)
Culture: NO GROWTH
Culture: NO GROWTH
Special Requests: ADEQUATE

## 2019-12-07 LAB — RENAL FUNCTION PANEL
Albumin: 2.6 g/dL — ABNORMAL LOW (ref 3.5–5.0)
Anion gap: 11 (ref 5–15)
BUN: 19 mg/dL (ref 8–23)
CO2: 25 mmol/L (ref 22–32)
Calcium: 8.2 mg/dL — ABNORMAL LOW (ref 8.9–10.3)
Chloride: 104 mmol/L (ref 98–111)
Creatinine, Ser: 1.16 mg/dL — ABNORMAL HIGH (ref 0.44–1.00)
GFR calc Af Amer: 47 mL/min — ABNORMAL LOW (ref >60)
GFR calc non Af Amer: 41 mL/min — ABNORMAL LOW (ref >60)
Glucose, Bld: 99 mg/dL (ref 70–99)
Phosphorus: 5 mg/dL — ABNORMAL HIGH (ref 2.5–4.6)
Potassium: 3.6 mmol/L (ref 3.5–5.1)
Sodium: 140 mmol/L (ref 135–145)

## 2019-12-07 LAB — MAGNESIUM: Magnesium: 1.9 mg/dL (ref 1.7–2.4)

## 2019-12-07 LAB — CBC
HCT: 33.6 % — ABNORMAL LOW (ref 36.0–46.0)
Hemoglobin: 10.6 g/dL — ABNORMAL LOW (ref 12.0–15.0)
MCH: 30.5 pg (ref 26.0–34.0)
MCHC: 31.5 g/dL (ref 30.0–36.0)
MCV: 96.6 fL (ref 80.0–100.0)
Platelets: 152 10*3/uL (ref 150–400)
RBC: 3.48 MIL/uL — ABNORMAL LOW (ref 3.87–5.11)
RDW: 15.5 % (ref 11.5–15.5)
WBC: 9.6 10*3/uL (ref 4.0–10.5)
nRBC: 0 % (ref 0.0–0.2)

## 2019-12-07 MED ORDER — BETHANECHOL CHLORIDE 10 MG PO TABS
10.0000 mg | ORAL_TABLET | Freq: Three times a day (TID) | ORAL | Status: DC
  Administered 2019-12-09: 10 mg via ORAL
  Filled 2019-12-07 (×5): qty 1

## 2019-12-07 MED ORDER — BETHANECHOL CHLORIDE 25 MG PO TABS
25.0000 mg | ORAL_TABLET | Freq: Three times a day (TID) | ORAL | Status: AC
  Administered 2019-12-07 (×3): 25 mg via ORAL
  Filled 2019-12-07 (×3): qty 1

## 2019-12-07 NOTE — Evaluation (Signed)
Occupational Therapy Evaluation Patient Details Name: Lauren [name] MRN: [id number] DOB: February 11, 1925 Today's Date: 12/07/2019    History of Present Illness 86 yo female admitted with acute respiratory failure and L1 comp fx after falling at memory care facility. Hx of dementia.   Clinical Impression   Pt admitted with the above diagnoses and presents with below problem list. Pt will benefit from continued acute OT to address the below listed deficits and maximize independence with basic ADLs prior to d/c to venue below. At baseline, pt ambulates with a rollator and assist needed for ADLs 2/2 dementia. Pt's daughter present throughout session and reporting that pt is "having a one of her bad days." Pt with irritated affect, turing head to the side and closing eyes often in response to questions. Chart review, observations, discussion with daughter, and clinical judgement utilized to assess current assist level with basic ADLs. Pt appears to be max A with most ADLs, supervision to min A (activity tolerance/thoroughness) with eating tasks at bed level/supported sitting. Of note, pt with [place] off and to her side at start of session. Pt consistently, adamantly refusing [place] with sats noted to be around 85, lowest 82 on RA lying in bed. Discussed this with daughter who was present and notified nursing.      Follow Up Recommendations  SNF    Equipment Recommendations  Other (comment) (defer to next venue)    Recommendations for Other Services       Precautions / Restrictions Precautions Precautions: Fall Precaution Comments: monitor O2 Restrictions Weight Bearing Restrictions: No      Mobility Bed Mobility               General bed mobility comments: pt refused  Transfers                 General transfer comment: pt refused    Balance                                           ADL either performed or assessed with clinical judgement   ADL Overall ADL's  : Needs assistance/impaired Eating/Feeding: Set up;Bed level;Minimal assistance Eating/Feeding Details (indicate cue type and reason): physically able to bring left hand to face in supine position Grooming: Maximal assistance;Bed level   Upper Body Bathing: Maximal assistance   Lower Body Bathing: Maximal assistance   Upper Body Dressing : Maximal assistance   Lower Body Dressing: Maximal assistance                 General ADL Comments: Pt internally focused/distracted. Disengaged throughout session other then to voice her frustrations. Not following one step commands (suspect behavior>cognition). Assist levels based on clinical judgement and chart review .     Vision         Perception     Praxis      Pertinent Vitals/Pain Pain Assessment: Faces Faces Pain Scale: Hurts even more Pain Location: neck Pain Descriptors / Indicators: Aching;Grimacing Pain Intervention(s): Limited activity within patient's tolerance;Monitored during session;Heat applied;Other (comment);Repositioned (pt refused heat pack. RN recently gave oral pain med. )     Hand Dominance     Extremity/Trunk Assessment Upper Extremity Assessment Upper Extremity Assessment: Generalized weakness;Difficult to assess due to impaired cognition   Lower Extremity Assessment Lower Extremity Assessment: Defer to PT evaluation   Cervical / Trunk Assessment Cervical / Trunk Assessment: Kyphotic  Communication Communication Communication: No difficulties   Cognition Arousal/Alertness: Awake/alert Behavior During Therapy: Flat affect;Agitated (Irritable) Overall Cognitive Status: History of cognitive impairments - at baseline                                 General Comments: baseline dementia. from memory care unit. Daughter reports pt has "bad days" when she gets very irritable and refuses things. Pt frequently stating to daughter "tell them Lauren [name] never been like this before! Only since I've  been here!"    General Comments  Pt refusing [place]. Noted O2 mostly holding around 85, lowest was 82. Nursing notified.    Exercises     Shoulder Instructions      Home Living Family/patient expects to be discharged to:: Other (Comment)                                 Additional Comments: from [facility] memory care unit      Prior Functioning/Environment Level of Independence: Needs assistance  Gait / Transfers Assistance Needed: rollator to ambulate per daughter ADL's / Homemaking Assistance Needed: assist 2/2 dementia            OT Problem List: Decreased strength;Decreased activity tolerance;Impaired balance (sitting and/or standing);Decreased cognition;Decreased safety awareness;Decreased knowledge of use of DME or AE;Decreased knowledge of precautions;Cardiopulmonary status limiting activity;Pain      OT Treatment/Interventions: Self-care/ADL training;Therapeutic exercise;Energy conservation;DME and/or AE instruction;Therapeutic activities;Patient/family education;Balance training;Cognitive remediation/compensation    OT Goals(Current goals can be found in the care plan section) Acute Rehab OT Goals Patient Stated Goal: per daughter, to have appropriate d/c plan OT Goal Formulation: With family Time For Goal Achievement: 12/21/19 Potential to Achieve Goals: Fair ADL Goals Pt Will Perform Eating: with set-up;sitting Pt Will Perform Grooming: with mod assist;sitting Pt Will Perform Upper Body Dressing: with mod assist;sitting Pt Will Perform Lower Body Dressing: with mod assist;sit to/from stand Pt Will Transfer to Toilet: ambulating;with min assist Pt Will Perform Toileting - Clothing Manipulation and hygiene: with mod assist;sit to/from stand Additional ADL Goal #1: Pt will complete bed mobility at min guard level to prepare for EOB/OOB ADLs.  OT Frequency: Min 2X/week   Barriers to D/C:    pt now with O2 needs and needing more assist than  baseline       Co-evaluation              AM-PAC OT "6 Clicks" Daily Activity     Outcome Measure Help from another person eating meals?: A Little Help from another person taking care of personal grooming?: A Lot Help from another person toileting, which includes using toliet, bedpan, or urinal?: A Lot Help from another person bathing (including washing, rinsing, drying)?: A Lot Help from another person to put on and taking off regular upper body clothing?: A Lot Help from another person to put on and taking off regular lower body clothing?: A Lot 6 Click Score: 13   End of Session Equipment Utilized During Treatment: Other (comment) (pt refusing [place]) Nurse Communication: Other (comment) (see general comments)  Activity Tolerance: Treatment limited secondary to agitation;Patient limited by pain;Patient limited by fatigue Patient left: in bed;with call bell/phone within reach;with bed alarm set;with family/visitor present  OT Visit Diagnosis: Unsteadiness on feet (R26.81);Pain;Muscle weakness (generalized) (M62.81);Other symptoms and signs involving cognitive function  Time: 3646-8032 OT Time Calculation (min): 19 min Charges:  OT General Charges $OT Visit: 1 Visit OT Evaluation $OT Eval Moderate Complexity: [place], OT Acute Rehabilitation Services Pager: [phone] Office: [phone]   Hortencia Pilar 12/07/2019, 11:16 AM

## 2019-12-07 NOTE — Progress Notes (Signed)
Pt refused her medications despite multiple attempts. After several attempts pt took some of her medication. Daughter at bedside. Per daughter whenever Pt is in bad mood she will not take any medication nor eat anything. Encouraged Pt to drink fluids. Pt is currently @ 1 l of O2, saturating between 89 - 90%. Pt continuously taking out the Nasal [place]. Able to redirect.  Pt did not void, bladder scan shows 282 ml. MD is notified via secure chat. Monitoring closely.

## 2019-12-07 NOTE — Progress Notes (Signed)
Assisted Pt to [facility], with 2 person assist, voided 373ml. Bladder scan done to measure Post void residual, its 86ml. Pt looks happy, alert and oriented x 3. Encouraged PO intake. MD is notified via secure chat.

## 2019-12-07 NOTE — Progress Notes (Signed)
PROGRESS NOTE  Lauren [name] AST:[id number] DOB: [birth year]   PCP: Patient, No Pcp Per  [name]  DOA: 12/05/2019 LOS: 1  Brief Narrative / Interim history: 86 year old female with history of dementia, HTN, HLD, hypothyroidism, thoracic vertebral compression fracture and recent hospitalization from 9/15-9/18 due to multifocal pneumonia returning after fall and hypoxemia.  Unclear mechanism or cause of fall.  Per EMS, was hypoxic to 70s when EMS arrived.   In ED, 95% on room air but desaturated to 89% later requiring supplemental oxygen.  RR ranged from 13-31.  Otherwise vital signs within appropriate range.  WBC 16.6. Cr 1.11 (about baseline).  BUN 14.  AST 49.  Total bili 1.4.  CTA chest revealed known changes from multifocal pneumonia, unstable T10 fracture but negative for PE.  Blood cultures obtained.  She was started on broad-spectrum antibiotics and admitted.  The next day, patient felt well.  Leukocytosis improving.  Blood cultures NGTD. Antibiotic de-escalated her to home Augmentin to complete treatment course for her multifocal pneumonia through 9/25.  Continue to require supplemental oxygen to maintain appropriate saturation.    Therapy recommended SNF.  Seems to have acute urinary retention and waxing and waning mental status likely from delirium.   Subjective: Seen and examined earlier this morning.  Had I&O twice with removal of about 500 cc urine bolus times.  Does not look happy this morning.  Also refused pain medication later this morning.  Reports hurting all over but not able to localize or describe.  Not a great historian.  She is only oriented to self.  Does not appear to be in distress.  Objective: Vitals:   12/06/19 2125 12/06/19 2234 12/07/19 0516 12/07/19 1337  BP: (!) 119/103  136/71 124/65  Pulse: 92  61 73  Resp: 16  16 15   Temp:  98.9 F (37.2 C) 97.6 F (36.4 C) (!) 97.4 F (36.3 C)  TempSrc:  Axillary Oral  Oral  SpO2: 93%  97% 93%  Weight:      Height:        Intake/Output Summary (Last 24 hours) at 12/07/2019 1404 Last data filed at 12/07/2019 1335 Gross per 24 hour  Intake 500 ml  Output 625 ml  Net -125 ml   Filed Weights   12/05/19 2300  Weight: 53.6 kg    Examination:  GENERAL: No apparent distress.  Nontoxic. HEENT: MMM.  Vision and hearing grossly intact.  NECK: Supple.  No apparent JVD.  RESP: On 2 L by [place].  No IWOB.  Fair aeration bilaterally. CVS:  RRR. Heart sounds normal.  ABD/GI/GU: BS+. Abd soft, NTND.  MSK/EXT:  Moves extremities. No apparent deformity. No edema.  SKIN: no apparent skin lesion or wound NEURO: Awake and alert.  Oriented only to self.  No apparent focal neuro deficit. PSYCH: Calm. Normal affect.  Procedures:  None  Microbiology summarized: COVID-19 PCR negative. Blood cultures pending.  Assessment & Plan: Acute hypoxemic respiratory failure: Likely from recent multifocal pneumonia and deconditioning.  CTA chest negative for PE. Could have some degree of atelectasis due to pain with deep breathing from compression fracture although she is not in pain now.  Continues to require 2 L to maintain appropriate saturation. -Continue p.o. Augmentin to finish course through 9/25 -Encourage incentive spirometry/OOB/PT/OT  Recent multifocal pneumonia: Stable. -Continue home Augmentin to complete treatment course as above  Stable T10 compression fracture New L1 compression fracture with 10% vertebral body height loss -Pain-free without  pain medications so far -Scheduled Tylenol 650 mg every 8 hours -Continue calcium and vitamin D  Dementia with some behavioral disturbance: She is oriented to self.  Refusing medications at times. -Reorientation and delirium precautions. -Continue home memantine  Anxiety and depression: Stable. -Continue home medications  Hypothyroidism: Stable. -Continue home medications  Essential hypertension:  Normotensive. -Continue home medications  CKD-3B: At baseline.  -Continue monitoring  Hyperphosphatemia: P5.0.  Due to CKD? -Recheck in the morning  Possible urinary retention? Had elevated values on bladder scan. Had I and O last night and this morning with about 500 cc UOP both times -Continue bladder scan -Restart bethanechol 25 mg 3 times daily x3 followed by 10 mg 3 times daily  Debility/physical deconditioning: Per daughter, patient was able to ambulate with walker prior to recent hospitalization.  Significant change from her baseline. -PT/OT recommended SNF  GOC/DNR/DNI-extensive discussion with patient's daughter at bedside.  Daughter is open to hospice referral if she does not improve after completed treatment for pneumonia and trial of therapy. -Will place referral to hospice on discharge to follow-up patient at SNF.   Body mass index is 17.97 kg/m.         DVT prophylaxis:  enoxaparin (LOVENOX) injection 30 mg Start: 12/06/19 1000  Code Status: DNR/DNI Family Communication: Updated patient's daughter at bedside. Status is: Inpatient  Remains inpatient appropriate because:Altered mental status, Unsafe d/c plan and Inpatient level of care appropriate due to severity of illness   Dispo: The patient is from: Memory care center              Anticipated d/c is to: SNF              Anticipated d/c date is: 1 day              Patient currently is not medically stable to d/c.            Consultants:  None   Sch Meds:  Scheduled Meds: . acetaminophen  650 mg Oral Q8H   Or  . acetaminophen  650 mg Rectal Q8H  . amoxicillin-clavulanate  1 tablet Oral BID  . atorvastatin  5 mg Oral Daily  . bethanechol  25 mg Oral TID   Followed by  . [START ON 12/08/2019] bethanechol  10 mg Oral TID  . buPROPion  150 mg Oral Daily  . calcium-vitamin D  1 tablet Oral Daily  . enoxaparin (LOVENOX) injection  30 mg Subcutaneous Q24H  . guaiFENesin  600 mg Oral BID  .  levothyroxine  88 mcg Oral Q0600  . memantine  14 mg Oral Daily  . metoprolol tartrate  50 mg Oral BID  . mirtazapine  15 mg Oral QHS  . saccharomyces boulardii  250 mg Oral BID   Continuous Infusions: . sodium chloride 50 mL/hr at 12/07/19 0443   PRN Meds:.oxyCODONE  Antimicrobials: Anti-infectives (From admission, onward)   Start     Dose/Rate Route Frequency Ordered Stop   12/06/19 2200  vancomycin (VANCOREADY) IVPB 500 mg/100 mL  Status:  Discontinued        500 mg 100 mL/hr over 60 Minutes Intravenous Every 24 hours 12/05/19 2128 12/06/19 1124   12/06/19 2000  ceFEPIme (MAXIPIME) 2 g in sodium chloride 0.9 % 100 mL IVPB  Status:  Discontinued        2 g 200 mL/hr over 30 Minutes Intravenous Every 24 hours 12/05/19 2128 12/06/19 1124   12/06/19 1800  amoxicillin-clavulanate (AUGMENTIN) 500-125 MG per tablet  500 mg        1 tablet Oral 2 times daily 12/06/19 1124     12/05/19 2000  vancomycin (VANCOCIN) IVPB 1000 mg/200 mL premix        1,000 mg 200 mL/hr over 60 Minutes Intravenous  Once 12/05/19 1956 12/05/19 2233   12/05/19 2000  ceFEPIme (MAXIPIME) 2 g in sodium chloride 0.9 % 100 mL IVPB        2 g 200 mL/hr over 30 Minutes Intravenous  Once 12/05/19 1956 12/05/19 2119       I have personally reviewed the following labs and images: CBC: Recent Labs  Lab 12/01/19 1231 12/01/19 1811 12/03/19 0502 12/03/19 0502 12/04/19 0756 12/05/19 1708 12/05/19 1720 12/06/19 0338 12/07/19 0338  WBC 8.0   < > 6.3  --  5.4  --  16.6* 12.6* 9.6  NEUTROABS 5.6  --  3.8  --  3.3  --  13.8*  --   --   HGB 10.5*   < > 10.4*   < > 11.1* 11.6* 12.2 10.9* 10.6*  HCT 32.8*   < > 33.3*   < > 35.4* 34.0* 38.2 33.6* 33.6*  MCV 94.8   < > 95.4  --  95.4  --  93.9 94.6 96.6  PLT 156   < > 159  --  161  --  177 164 152   < > = values in this interval not displayed.   BMP &GFR Recent Labs  Lab 12/02/19 0525 12/02/19 0525 12/03/19 0502 12/04/19 0756 12/05/19 1708 12/05/19 1720  12/07/19 0338  NA 141   < > 140 141 141 140 140  K 3.9   < > 3.5 3.6 5.1 4.9 3.6  CL 106   < > 106 105 104 104 104  CO2 24  --  25 25  --  24 25  GLUCOSE 128*   < > 91 93 115* 116* 99  BUN 27*   < > 19 15 17 14 19   CREATININE 1.38*   < > 1.12* 1.07* 1.00 1.11* 1.16*  CALCIUM 8.1*  --  7.9* 8.2*  --  8.6* 8.2*  MG  --   --   --  1.7  --   --  1.9  PHOS  --   --   --   --   --   --  5.0*   < > = values in this interval not displayed.   Estimated Creatinine Clearance: 25.6 mL/min (A) (by C-G formula based on SCr of 1.16 mg/dL (H)). Liver & Pancreas: Recent Labs  Lab 12/01/19 1231 12/05/19 1720 12/07/19 0338  AST 19 49*  --   ALT 10 15  --   ALKPHOS 63 76  --   BILITOT 0.7 1.4*  --   PROT 5.7* 6.7  --   ALBUMIN 2.8* 3.1* 2.6*   No results for input(s): LIPASE, AMYLASE in the last 168 hours. No results for input(s): AMMONIA in the last 168 hours. Diabetic: No results for input(s): HGBA1C in the last 72 hours. Recent Labs  Lab 12/03/19 1146  GLUCAP 89   Cardiac Enzymes: No results for input(s): CKTOTAL, CKMB, CKMBINDEX, TROPONINI in the last 168 hours. No results for input(s): PROBNP in the last 8760 hours. Coagulation Profile: No results for input(s): INR, PROTIME in the last 168 hours. Thyroid Function Tests: No results for input(s): TSH, T4TOTAL, FREET4, T3FREE, THYROIDAB in the last 72 hours. Lipid Profile: No results for input(s): CHOL, HDL, LDLCALC,  TRIG, CHOLHDL, LDLDIRECT in the last 72 hours. Anemia Panel: No results for input(s): VITAMINB12, FOLATE, FERRITIN, TIBC, IRON, RETICCTPCT in the last 72 hours. Urine analysis:    Component Value Date/Time   COLORURINE YELLOW 10/11/2015 1250   APPEARANCEUR CLEAR 10/11/2015 1250   LABSPEC 1.016 10/11/2015 1250   PHURINE 5.5 10/11/2015 1250   GLUCOSEU NEGATIVE 10/11/2015 1250   HGBUR SMALL (A) 10/11/2015 1250   BILIRUBINUR NEGATIVE 10/11/2015 1250   KETONESUR NEGATIVE 10/11/2015 1250   PROTEINUR NEGATIVE 10/11/2015  1250   UROBILINOGEN 0.2 02/12/2014 1015   NITRITE NEGATIVE 10/11/2015 1250   LEUKOCYTESUR NEGATIVE 10/11/2015 1250   Sepsis Labs: Invalid input(s): PROCALCITONIN, [place]  Microbiology: Recent Results (from the past 240 hour(s))  SARS Coronavirus 2 by RT PCR (hospital order, performed in [facility] hospital lab) Nasopharyngeal Nasopharyngeal Swab     Status: None   Collection Time: 12/01/19 12:40 PM   Specimen: Nasopharyngeal Swab  Result Value Ref Range Status   SARS Coronavirus 2 NEGATIVE NEGATIVE Final    Comment: (NOTE) SARS-CoV-2 target nucleic acids are NOT DETECTED.  The SARS-CoV-2 RNA is generally detectable in upper and lower respiratory specimens during the acute phase of infection. The lowest concentration of SARS-CoV-2 viral copies this assay can detect is 250 copies / mL. A negative result does not preclude SARS-CoV-2 infection and should not be used as the sole basis for treatment or other patient management decisions.  A negative result may occur with improper specimen collection / handling, submission of specimen other than nasopharyngeal swab, presence of viral mutation(s) within the areas targeted by this assay, and inadequate number of viral copies (<250 copies / mL). A negative result must be combined with clinical observations, patient history, and epidemiological information.  Fact Sheet for Patients:   StrictlyIdeas.no  Fact Sheet for Healthcare Providers: BankingDealers.co.za  This test is not yet approved or  cleared by the [place] FDA and has been authorized for detection and/or diagnosis of SARS-CoV-2 by FDA under an Emergency Use Authorization (EUA).  This EUA will remain in effect (meaning this test can be used) for the duration of the COVID-19 declaration under Section 564(b)(1) of the Act, 21 U.S.C. section 360bbb-3(b)(1), unless the authorization is terminated or revoked  sooner.  Performed at [facility], [place] [address] [place], [place] [address]   Respiratory Panel by PCR     Status: None   Collection Time: 12/01/19  6:55 PM   Specimen: Nasopharyngeal Swab; Respiratory  Result Value Ref Range Status   Adenovirus NOT DETECTED NOT DETECTED Final   Coronavirus 229E NOT DETECTED NOT DETECTED Final    Comment: (NOTE) The Coronavirus on the Respiratory Panel, DOES NOT test for the novel  Coronavirus (2019 nCoV)    Coronavirus HKU1 NOT DETECTED NOT DETECTED Final   Coronavirus NL63 NOT DETECTED NOT DETECTED Final   Coronavirus OC43 NOT DETECTED NOT DETECTED Final   Metapneumovirus NOT DETECTED NOT DETECTED Final   Rhinovirus / Enterovirus NOT DETECTED NOT DETECTED Final   Influenza A NOT DETECTED NOT DETECTED Final   Influenza B NOT DETECTED NOT DETECTED Final   Parainfluenza Virus 1 NOT DETECTED NOT DETECTED Final   Parainfluenza Virus 2 NOT DETECTED NOT DETECTED Final   Parainfluenza Virus 3 NOT DETECTED NOT DETECTED Final   Parainfluenza Virus 4 NOT DETECTED NOT DETECTED Final   Respiratory Syncytial Virus NOT DETECTED NOT DETECTED Final   Bordetella pertussis NOT DETECTED NOT DETECTED Final   Chlamydophila pneumoniae NOT DETECTED NOT  DETECTED Final   Mycoplasma pneumoniae NOT DETECTED NOT DETECTED Final    Comment: Performed at [facility], [place] [address] [place], [place] [address]  MRSA PCR Screening     Status: None   Collection Time: 12/02/19 12:30 AM   Specimen: Nasal Mucosa; Nasopharyngeal  Result Value Ref Range Status   MRSA by PCR NEGATIVE NEGATIVE Final    Comment:        The GeneXpert MRSA Assay (FDA approved for NASAL specimens only), is one component of a comprehensive MRSA colonization surveillance program. It is not intended to diagnose MRSA infection nor to guide or monitor treatment for MRSA infections. Performed at [facility], [place] [address] [place], [place] [address]    Culture, blood (routine x 2)     Status: None   Collection Time: 12/02/19  3:03 PM   Specimen: BLOOD  Result Value Ref Range Status   Specimen Description   Final    BLOOD RIGHT ANTECUBITAL Performed at [place] [address] [place], [place] [address]    Special Requests   Final    BOTTLES DRAWN AEROBIC AND ANAEROBIC Blood Culture adequate volume Performed at [place] [address], [place], [place] [address]    Culture   Final    NO GROWTH 5 DAYS Performed at [facility], [place] [address] [place], [place] [address]    Report Status 12/07/2019 FINAL  Final  Culture, blood (routine x 2)     Status: None   Collection Time: 12/02/19  3:03 PM   Specimen: BLOOD LEFT HAND  Result Value Ref Range Status   Specimen Description   Final    BLOOD LEFT HAND Performed at [place] [address] [place], [place] [address]    Special Requests   Final    BOTTLES DRAWN AEROBIC ONLY Blood Culture results may not be optimal due to an excessive volume of blood received in culture bottles Performed at [place] [address] [place], [place] [address]    Culture   Final    NO GROWTH 5 DAYS Performed at [facility], [place] [address] [place], [place] [address]    Report Status 12/07/2019 FINAL  Final  Blood culture (routine x 2)     Status: None (Preliminary result)   Collection Time: 12/05/19  5:07 PM   Specimen: BLOOD  Result Value Ref Range Status   Specimen Description   Final    BLOOD RIGHT ANTECUBITAL Performed at [facility], [place] [address] [place], [place] [address]    Special Requests   Final    BOTTLES DRAWN AEROBIC AND ANAEROBIC Blood Culture adequate volume Performed at [place] [address] [place], [place] [address]    Culture   Final    NO GROWTH 2 DAYS Performed at [place] [address], [place], [place]  [address]    Report Status PENDING  Incomplete  Blood culture (routine x 2)     Status: None (Preliminary result)   Collection Time: 12/05/19  5:08 PM   Specimen: BLOOD  Result Value Ref Range Status   Specimen Description   Final    BLOOD LEFT ANTECUBITAL Performed at [facility], [place] [address] [place], [place] [address]    Special Requests   Final    BOTTLES DRAWN AEROBIC AND ANAEROBIC Blood Culture adequate volume Performed at  [facility], [place] [address] [place], [place] [address]    Culture   Final    NO GROWTH 2 DAYS Performed at [place] [address], [place], [place] [address]    Report Status PENDING  Incomplete  SARS Coronavirus 2 by RT PCR (hospital order, performed in [facility] hospital lab) Nasopharyngeal Nasopharyngeal Swab     Status: None   Collection Time: 12/05/19  5:22 PM   Specimen: Nasopharyngeal Swab  Result Value Ref Range Status   SARS Coronavirus 2 NEGATIVE NEGATIVE Final    Comment: (NOTE) SARS-CoV-2 target nucleic acids are NOT DETECTED.  The SARS-CoV-2 RNA is generally detectable in upper and lower respiratory specimens during the acute phase of infection. The lowest concentration of SARS-CoV-2 viral copies this assay can detect is 250 copies / mL. A negative result does not preclude SARS-CoV-2 infection and should not be used as the sole basis for treatment or other patient management decisions.  A negative result may occur with improper specimen collection / handling, submission of specimen other than nasopharyngeal swab, presence of viral mutation(s) within the areas targeted by this assay, and inadequate number of viral copies (<250 copies / mL). A negative result must be combined with clinical observations, patient history, and epidemiological information.  Fact Sheet for Patients:   StrictlyIdeas.no  Fact Sheet for Healthcare  Providers: BankingDealers.co.za  This test is not yet approved or  cleared by the [place] FDA and has been authorized for detection and/or diagnosis of SARS-CoV-2 by FDA under an Emergency Use Authorization (EUA).  This EUA will remain in effect (meaning this test can be used) for the duration of the COVID-19 declaration under Section 564(b)(1) of the Act, 21 U.S.C. section 360bbb-3(b)(1), unless the authorization is terminated or revoked sooner.  Performed at [facility], [place] [address], [place], [place] [address]     Radiology Studies: No results found.    Vianney Kopecky T. [place]  If 7PM-7AM, please contact night-coverage [email or web address] 12/07/2019, 2:04 PM

## 2019-12-07 NOTE — NC FL2 (Addendum)
[place] LEVEL OF CARE SCREENING TOOL     IDENTIFICATION  Patient Name: Lauren [name] Birthdate: [birth year] Sex: female Admission Date (Current Location): 12/05/2019  [facility] and [place] Number:  Herbalist and Address:  [facility],  [place] [address] [place]      Provider Number: [id number]  Attending Physician Name and Address:  Mercy Riding, MD  Relative Name and Phone Number:  Memmott,Cathy Daughter [phone]    Current Level of Care: Hospital Recommended Level of Care: [place] Prior Approval Number:    Date Approved/Denied: 12/07/19 PASRR Number: [id number]  Discharge Plan: SNF    Current Diagnoses: Patient Active Problem List   Diagnosis Date Noted  . Acute respiratory failure with hypoxemia ([place]) 12/05/2019  . Acute hypoxemic respiratory failure ([place]) 12/05/2019  . HCAP (healthcare-associated pneumonia) 12/02/2019  . CAP (community acquired pneumonia) 12/01/2019  . Respiratory failure ([place]) 12/01/2019  . Hypotension 12/01/2019  . CKD (chronic kidney disease) stage 3, GFR 30-59 ml/min 09/01/2017  . Osteoporosis 10/21/2016  . Weight gain 04/16/2016  . Hypocalcemia 04/16/2016  . Hypothyroidism 04/16/2016  . Edema of right foot 04/16/2016  . Dementia ([place]) 10/31/2015  . 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 Loss of weight 03/24/2014  . Hypertension   . High cholesterol   . Anxiety     Orientation RESPIRATION BLADDER Height & Weight     Self  O2 Incontinent Weight: 118 lb 2.7 oz (53.6 kg) Height:  5\' 8"  (172.7 cm)  BEHAVIORAL SYMPTOMS/MOOD NEUROLOGICAL BOWEL NUTRITION STATUS      Incontinent Diet (regualr)  AMBULATORY STATUS COMMUNICATION OF NEEDS Skin   Limited Assist Verbally Normal                       Personal Care Assistance Level of Assistance  Bathing, Feeding, Dressing Bathing Assistance: Limited assistance Feeding assistance: Limited assistance Dressing  Assistance: Limited assistance     Functional Limitations Info  Sight, Hearing, Speech Sight Info: Adequate Hearing Info: Adequate Speech Info: Adequate    SPECIAL CARE FACTORS FREQUENCY  PT (By licensed PT), OT (By licensed OT)     PT Frequency: 5x/wk OT Frequency: 5x/wk            Contractures Contractures Info: Not present    Additional Factors Info  Code Status, Allergies, Psychotropic Code Status Info: DNR Allergies Info: see MAR Psychotropic Info: see MAR         Current Medications (12/07/2019):  This is the current hospital active medication list Current Facility-Administered Medications  Medication Dose Route Frequency Provider Last Rate Last Admin  . 0.45 % sodium chloride infusion   Intravenous Continuous Roney Jaffe, MD 50 mL/hr at 12/07/19 0443 New Bag at 12/07/19 0443  . acetaminophen (TYLENOL) tablet 650 mg  650 mg Oral Q8H Green, Terri L, RPH   650 mg at 12/06/19 2109   Or  . acetaminophen (TYLENOL) suppository 650 mg  650 mg Rectal Q8H Green, Terri L, RPH      . amoxicillin-clavulanate (AUGMENTIN) 500-125 MG per tablet 500 mg  1 tablet Oral BID Wendee Beavers T, MD   500 mg at 12/07/19 1008  . atorvastatin (LIPITOR) tablet 5 mg  5 mg Oral Daily Roney Jaffe, MD   5 mg at 12/06/19 1043  . bethanechol (URECHOLINE) tablet 25 mg  25 mg Oral TID Wendee Beavers T, MD   25 mg at 12/07/19 1007   Followed by  . [START  ON 12/08/2019] bethanechol (URECHOLINE) tablet 10 mg  10 mg Oral TID Wendee Beavers T, MD      . buPROPion (WELLBUTRIN XL) 24 hr tablet 150 mg  150 mg Oral Daily Roney Jaffe, MD   150 mg at 12/06/19 1046  . calcium-vitamin D (OSCAL WITH D) 500-200 MG-UNIT per tablet 1 tablet  1 tablet Oral Daily Roney Jaffe, MD   1 tablet at 12/06/19 1043  . enoxaparin (LOVENOX) injection 30 mg  30 mg Subcutaneous Q24H Roney Jaffe, MD   30 mg at 12/07/19 1009  . guaiFENesin (MUCINEX) 12 hr tablet 600 mg  600 mg Oral BID Roney Jaffe, MD   600 mg at  12/06/19 2111  . levothyroxine (SYNTHROID) tablet 88 mcg  88 mcg Oral Q0600 Roney Jaffe, MD   88 mcg at 12/06/19 0535  . memantine (NAMENDA XR) 24 hr capsule 14 mg  14 mg Oral Daily Roney Jaffe, MD   14 mg at 12/06/19 1044  . metoprolol tartrate (LOPRESSOR) tablet 50 mg  50 mg Oral BID Roney Jaffe, MD   50 mg at 12/06/19 2111  . mirtazapine (REMERON) tablet 15 mg  15 mg Oral QHS Roney Jaffe, MD   15 mg at 12/06/19 2111  . oxyCODONE (Oxy IR/ROXICODONE) immediate release tablet 5 mg  5 mg Oral Q8H PRN Wendee Beavers T, MD   5 mg at 12/07/19 1008  . saccharomyces boulardii (FLORASTOR) capsule 250 mg  250 mg Oral BID Roney Jaffe, MD   250 mg at 12/06/19 2111     Discharge Medications: Please see discharge summary for a list of discharge medications.  Relevant Imaging Results:  Relevant Lab Results:   Additional Information SSN [id number]  Lennart Pall, LCSW

## 2019-12-07 NOTE — Progress Notes (Signed)
Pt retaining urine, 437 mL bladder scan. Paged Dr. Sharlet [place]ina for I&O cath.

## 2019-12-07 NOTE — Evaluation (Signed)
SLP Cancellation Note  Patient Details Name: Lauren [name] MRN: [id number] DOB: 05/12/1924   Cancelled treatment:       Reason Eval/Treat Not Completed: Other (comment) Pt asleep and daughter reports she is having a "bad day" and does now want to eat.  SLP spoke to daughter [name] further who reports pt had some dysphagia x 1.5 months - coughing with intake of thin liquids.  SLP reviewed last evaluation and recommendation for thicker liquids with meals and thin water between - however pt would not drink the thicker liquids- which then may increase risk of dehydration.  Discussed risks with thin liquid aspiration and option for MBS *however this SLP advised it would likely not change her care plan* and daughter [name] agreeable. Daughter advises she would like pt to continue diet as pt desires. Advised will continue efforts to see pt to provide mitigation strategies. Thanks.   Kathleen Lime, MS [facility] SLP Acute Rehab Services Office [phone]    Macario Golds 12/07/2019, 12:25 PM

## 2019-12-08 DIAGNOSIS — Z515 Encounter for palliative care: Secondary | ICD-10-CM

## 2019-12-08 LAB — CBC
HCT: 39.3 % (ref 36.0–46.0)
Hemoglobin: 12.2 g/dL (ref 12.0–15.0)
MCH: 29.7 pg (ref 26.0–34.0)
MCHC: 31 g/dL (ref 30.0–36.0)
MCV: 95.6 fL (ref 80.0–100.0)
Platelets: 126 10*3/uL — ABNORMAL LOW (ref 150–400)
RBC: 4.11 MIL/uL (ref 3.87–5.11)
RDW: 15.6 % — ABNORMAL HIGH (ref 11.5–15.5)
WBC: 10.1 10*3/uL (ref 4.0–10.5)
nRBC: 0 % (ref 0.0–0.2)

## 2019-12-08 LAB — RENAL FUNCTION PANEL
Albumin: 2.7 g/dL — ABNORMAL LOW (ref 3.5–5.0)
Anion gap: 13 (ref 5–15)
BUN: 14 mg/dL (ref 8–23)
CO2: 24 mmol/L (ref 22–32)
Calcium: 8.1 mg/dL — ABNORMAL LOW (ref 8.9–10.3)
Chloride: 104 mmol/L (ref 98–111)
Creatinine, Ser: 0.98 mg/dL (ref 0.44–1.00)
GFR calc Af Amer: 58 mL/min — ABNORMAL LOW (ref >60)
GFR calc non Af Amer: 50 mL/min — ABNORMAL LOW (ref >60)
Glucose, Bld: 91 mg/dL (ref 70–99)
Phosphorus: 3.9 mg/dL (ref 2.5–4.6)
Potassium: 3.8 mmol/L (ref 3.5–5.1)
Sodium: 141 mmol/L (ref 135–145)

## 2019-12-08 LAB — MAGNESIUM: Magnesium: 1.7 mg/dL (ref 1.7–2.4)

## 2019-12-08 MED ORDER — HALOPERIDOL LACTATE 5 MG/ML IJ SOLN
2.0000 mg | Freq: Four times a day (QID) | INTRAMUSCULAR | Status: AC | PRN
  Administered 2019-12-08: 2 mg via INTRAVENOUS
  Filled 2019-12-08: qty 1

## 2019-12-08 NOTE — Progress Notes (Signed)
Manufacturing engineer [facility]) Hospital Liaison Note:  Referral received for hospice services at [facility] upon discharge.   Called and left message, including [facility] HLT contact information for call back,  for patient daughter - will reach out again this afternoon to confirm interest and explain services. Will update TOC and Epic Notes once HLT has spoken to daughter.    Thank you for this referral,    Gar Ponto, RN [facility] Liaison (in [place]) [phone]

## 2019-12-08 NOTE — Progress Notes (Signed)
Patient's demeanor much improved this morning after administration of Haldol.

## 2019-12-08 NOTE — Evaluation (Signed)
SLP Cancellation Note  Patient Details Name: AZLYNN MITNICK MRN: [id number] DOB: Jan 16, 1925   Cancelled treatment:       Reason Eval/Treat Not Completed: Other (comment) (per notes, pt refusing therapies, oxygen and medications today, SLP spoke to daughter [name] yesterday would was agreeable to diet as tolerated with known aspiration, will continue efforts to assure education completed)  Kathleen Lime, MS [place] Office [phone]  Macario Golds 12/08/2019, 8:03 PM

## 2019-12-08 NOTE — Progress Notes (Signed)
PT Cancellation Note  Patient Details Name: Lauren [name] MRN: [id number] DOB: May 28, 1924   Cancelled Treatment:    Reason Eval/Treat Not Completed:  Attempted PT tx session-pt refused to participate. Daughter stated "I think we should just let her do what she wants." Daughter did not wish for [place] to encourage pt to participate. Unsure if plan is still for [facility] rehab. Will check back another day.

## 2019-12-08 NOTE — Progress Notes (Signed)
Today approximately 1000 when I offered Ms. Lauren [name] her med's. MD Karleen [place] and daughter present when she refused and also refusing oxygen. MD and daughter said not to force her. O2 was 85 then. I was able to put it back on a little later and now when I observe patient has again removed and refusing oxygen.

## 2019-12-08 NOTE — TOC Progression Note (Addendum)
Transition of Care [facility]) - Progression Note    Patient Details  Name: Lauren [name] MRN: [id number] Date of Birth: 1924/08/24  Transition of Care [facility]) CM/SW Contact  Lennart Pall, LCSW Phone Number: 12/08/2019, 1:45 PM  Clinical Narrative:    Have spoken with pt's daughter and with Dr. Domingo Cocking with Palliative Care today to discuss d/c plans.  Daughter very concerned with not wanting to "force" her mother to "do rehab" and wishes to focus on more comfort measures.  We have discussed what long term care at a SNF would look like and likelihood that insurance will not cover this level of care.  Have reviewed case with Dr. Domingo Cocking who plans to meet with pt/ daughter this afternoon.  I have, also, confirmed with RN at [facility] that IF pt is appropriate for Hospice Care then she could return there as long as referral has been made prior to dc and confirm that Hospice will be following.  Daughter awaiting consult from Palliative MD to further discuss goals of care and make decisions about dc plans.  1550 Addendum:  Met with pt's daughter and confirming her preference for [facility] for Hospice coverage and plan to return to [facility].  Hoping to have arrangements in place for a return tomorrow.  Have contacted Trixie Dredge with [facility] who notes someone will be reaching out to daughter to being admission to their service. Continue to follow.  Expected Discharge Plan: [place] Barriers to Discharge: Continued Medical Work up  Expected Discharge Plan and Services Expected Discharge Plan: [place] In-house Referral: Clinical Social Work   Post Acute Care Choice: [place] Living arrangements for the past 2 months: [place] (Memory care) Expected Discharge Date: 12/06/19               DME Arranged: N/A DME Agency: NA       HH Arranged: NA HH Agency: NA         Social Determinants of Health (SDOH)  Interventions    Readmission Risk Interventions No flowsheet data found.

## 2019-12-08 NOTE — Progress Notes (Signed)
Pt climbing OOB, pulling at lines. Paged Dr. Sharlet Salina to request an order for medication.

## 2019-12-08 NOTE — Progress Notes (Signed)
Pt alert and aware sitting up in bed. She said that she doesn't know why she is here.  I spoke with the daughter outside of the room. She is torn with what to do. She wants what is best for her mother. The pt's mental status has a lot to do with how the daughter will respond. The chaplain offered caring presence and listening ear. I sang sacred music to pt and the pt hummed along. She use to sing in the church choir. Further visits will be offered.

## 2019-12-08 NOTE — Progress Notes (Signed)
PROGRESS NOTE    Lauren [name]  QMG:[id number] DOB: Feb 18, 1925 DOA: 12/05/2019 PCP: Patient, No Pcp Per    Chief Complaint  Patient presents with  . Hypoxemic  . Back Pain    Brief Narrative:  86 year old lady prior history of dementia, hyperlipidemia, hypothyroidism, thoracic vertebral compression fractures and recent hospitalization from September 15-18 due to multifocal pneumonia returns from fall and hypoxemia. CT angiogram revealed multifocal pneumonia, unstable T10 fracture.  Negative for PE.  She was started on broad-spectrum IV antibiotics.  Patient continued to improve and her antibiotics transition to oral Augmentin to complete the course.  Plan for nasal cannula oxygen on discharge to maintain oxygen levels greater than 90%. PT OT evaluation recommending SNF.  Assessment & Plan:   Principal Problem:   Acute respiratory failure with hypoxemia (HCC) Active Problems:   Hypertension   Anxiety   Dementia (HCC)   Hypothyroidism   CKD (chronic kidney disease) stage 3, GFR 30-59 ml/min   HCAP (healthcare-associated pneumonia)   Acute hypoxemic respiratory failure (HCC)  Acute hypoxemic respiratory failure:  Probably secondary to multifocal pneumonia and deconditioning.  CTA negative for PE.  Continues to have 2 lit of [place] oxygen to keep sats greater than 95% Continue with augmentin to complete the course.      Stable T10 compression fracture with new L1 compression fracture Currently patient is pain-free Tylenol as needed and continue with calcium and vitamin D.    Dementia with behavioral disturbances Refusing care refusing medications and sometimes nasal cannula oxygen.  Continue with reorientation and delirium precautions and continue with home Namenda.    Anxiety and depression Stable continue with home medications    Hypothyroidism Continue with Synthroid.   Essential hypertension continue with home medications.   Stage IIIb CKD Creatinine  appears to be at baseline at this time.   Physical deconditioning and debility PT evaluation recommending SNF.   Due to patient's recurrent admissions, deconditioning, debility and poor progression daughter is open to hospice at [facility].    DVT prophylaxis: Lovenox.  Code Status: DNR/ DNI Family Communication: Daughter at bedside.  Disposition:   Status is: Inpatient  Remains inpatient appropriate because:Altered mental status   Dispo: The patient is from: Home              Anticipated d/c is to: SNF              Anticipated d/c date is: 1 day              Patient currently is medically stable to d/c.       Consultants:   Palliative care.    Procedures: none   Antimicrobials: Augmentin   Subjective: Pt does not appear to be engaging in conversation she wants to be left alone. Patient is alert and oriented to self.  Discussed with daughter at bedside who requesting for palliative care consult and hospice on discharge at SNF.  TOC was made aware of this.  Objective: Vitals:   12/07/19 2021 12/08/19 0443 12/08/19 0501 12/08/19 1312  BP: 121/61  135/73 (!) 153/80  Pulse: 70  73 (!) 108  Resp: 16  16 18   Temp: 98.4 F (36.9 C)  99 F (37.2 C) 98 F (36.7 C)  TempSrc: Oral   Oral  SpO2: 93% 93% 95% 95%  Weight:      Height:        Intake/Output Summary (Last 24 hours) at 12/08/2019 1402 Last data filed at 12/08/2019 1310 Gross per  24 hour  Intake 1756.64 ml  Output 821 ml  Net 935.64 ml   Filed Weights   12/05/19 2300  Weight: 53.6 kg    Examination:  General exam: Appears calm and comfortable  Respiratory system: Clear to auscultation. Respiratory effort normal. Cardiovascular system: S1 & S2 heard, RRR. No JVD,. No pedal edema. Gastrointestinal system: Abdomen is nondistended, soft and nontender. Normal bowel sounds heard. Central nervous system: Alert but confused.  Extremities: Symmetric 5 x 5 power. Skin: No rashes, lesions or  ulcers Psychiatry: cannot be assessed.     Data Reviewed: I have personally reviewed following labs and imaging studies  CBC: Recent Labs  Lab 12/03/19 0502 12/03/19 0502 12/04/19 0756 12/04/19 0756 12/05/19 1708 12/05/19 1720 12/06/19 0338 12/07/19 0338 12/08/19 0340  WBC 6.3   < > 5.4  --   --  16.6* 12.6* 9.6 10.1  NEUTROABS 3.8  --  3.3  --   --  13.8*  --   --   --   HGB 10.4*   < > 11.1*   < > 11.6* 12.2 10.9* 10.6* 12.2  HCT 33.3*   < > 35.4*   < > 34.0* 38.2 33.6* 33.6* 39.3  MCV 95.4   < > 95.4  --   --  93.9 94.6 96.6 95.6  PLT 159   < > 161  --   --  177 164 152 126*   < > = values in this interval not displayed.    Basic Metabolic Panel: Recent Labs  Lab 12/03/19 0502 12/03/19 0502 12/04/19 0756 12/05/19 1708 12/05/19 1720 12/07/19 0338 12/08/19 0340  NA 140   < > 141 141 140 140 141  K 3.5   < > 3.6 5.1 4.9 3.6 3.8  CL 106   < > 105 104 104 104 104  CO2 25  --  25  --  24 25 24   GLUCOSE 91   < > 93 115* 116* 99 91  BUN 19   < > 15 17 14 19 14   CREATININE 1.12*   < > 1.07* 1.00 1.11* 1.16* 0.98  CALCIUM 7.9*  --  8.2*  --  8.6* 8.2* 8.1*  MG  --   --  1.7  --   --  1.9 1.7  PHOS  --   --   --   --   --  5.0* 3.9   < > = values in this interval not displayed.    GFR: Estimated Creatinine Clearance: 30.3 mL/min (by C-G formula based on SCr of 0.98 mg/dL).  Liver Function Tests: Recent Labs  Lab 12/05/19 1720 12/07/19 0338 12/08/19 0340  AST 49*  --   --   ALT 15  --   --   ALKPHOS 76  --   --   BILITOT 1.4*  --   --   PROT 6.7  --   --   ALBUMIN 3.1* 2.6* 2.7*    CBG: Recent Labs  Lab 12/03/19 1146  GLUCAP 89     Recent Results (from the past 240 hour(s))  SARS Coronavirus 2 by RT PCR (hospital order, performed in [facility] hospital lab) Nasopharyngeal Nasopharyngeal Swab     Status: None   Collection Time: 12/01/19 12:40 PM   Specimen: Nasopharyngeal Swab  Result Value Ref Range Status   SARS Coronavirus 2 NEGATIVE NEGATIVE  Final    Comment: (NOTE) SARS-CoV-2 target nucleic acids are NOT DETECTED.  The SARS-CoV-2 RNA is generally detectable  in upper and lower respiratory specimens during the acute phase of infection. The lowest concentration of SARS-CoV-2 viral copies this assay can detect is 250 copies / mL. A negative result does not preclude SARS-CoV-2 infection and should not be used as the sole basis for treatment or other patient management decisions.  A negative result may occur with improper specimen collection / handling, submission of specimen other than nasopharyngeal swab, presence of viral mutation(s) within the areas targeted by this assay, and inadequate number of viral copies (<250 copies / mL). A negative result must be combined with clinical observations, patient history, and epidemiological information.  Fact Sheet for Patients:   StrictlyIdeas.no  Fact Sheet for Healthcare Providers: BankingDealers.co.za  This test is not yet approved or  cleared by the [place] FDA and has been authorized for detection and/or diagnosis of SARS-CoV-2 by FDA under an Emergency Use Authorization (EUA).  This EUA will remain in effect (meaning this test can be used) for the duration of the COVID-19 declaration under Section 564(b)(1) of the Act, 21 U.S.C. section 360bbb-3(b)(1), unless the authorization is terminated or revoked sooner.  Performed at [facility], [place] [address], [place], [place] [address]   Respiratory Panel by PCR     Status: None   Collection Time: 12/01/19  6:55 PM   Specimen: Nasopharyngeal Swab; Respiratory  Result Value Ref Range Status   Adenovirus NOT DETECTED NOT DETECTED Final   Coronavirus 229E NOT DETECTED NOT DETECTED Final    Comment: (NOTE) The Coronavirus on the Respiratory Panel, DOES NOT test for the novel  Coronavirus (2019 nCoV)    Coronavirus HKU1 NOT DETECTED NOT DETECTED Final    Coronavirus NL63 NOT DETECTED NOT DETECTED Final   Coronavirus OC43 NOT DETECTED NOT DETECTED Final   Metapneumovirus NOT DETECTED NOT DETECTED Final   Rhinovirus / Enterovirus NOT DETECTED NOT DETECTED Final   Influenza A NOT DETECTED NOT DETECTED Final   Influenza B NOT DETECTED NOT DETECTED Final   Parainfluenza Virus 1 NOT DETECTED NOT DETECTED Final   Parainfluenza Virus 2 NOT DETECTED NOT DETECTED Final   Parainfluenza Virus 3 NOT DETECTED NOT DETECTED Final   Parainfluenza Virus 4 NOT DETECTED NOT DETECTED Final   Respiratory Syncytial Virus NOT DETECTED NOT DETECTED Final   Bordetella pertussis NOT DETECTED NOT DETECTED Final   Chlamydophila pneumoniae NOT DETECTED NOT DETECTED Final   Mycoplasma pneumoniae NOT DETECTED NOT DETECTED Final    Comment: Performed at [facility], [place]. [address] [place], [place] [address]  MRSA PCR Screening     Status: None   Collection Time: 12/02/19 12:30 AM   Specimen: Nasal Mucosa; Nasopharyngeal  Result Value Ref Range Status   MRSA by PCR NEGATIVE NEGATIVE Final    Comment:        The GeneXpert MRSA Assay (FDA approved for NASAL specimens only), is one component of a comprehensive MRSA colonization surveillance program. It is not intended to diagnose MRSA infection nor to guide or monitor treatment for MRSA infections. Performed at [facility], [place] [address], [place], [place] [address]   Culture, blood (routine x 2)     Status: None   Collection Time: 12/02/19  3:03 PM   Specimen: BLOOD  Result Value Ref Range Status   Specimen Description   Final    BLOOD RIGHT ANTECUBITAL Performed at [place] [address], [place], [place] [address]    Special Requests   Final  BOTTLES DRAWN AEROBIC AND ANAEROBIC Blood Culture adequate volume Performed at [place] [address] [place], [place] [address]    Culture   Final    NO GROWTH 5 DAYS Performed at  [facility], [place] [address], [place], [place] [address]    Report Status 12/07/2019 FINAL  Final  Culture, blood (routine x 2)     Status: None   Collection Time: 12/02/19  3:03 PM   Specimen: BLOOD LEFT HAND  Result Value Ref Range Status   Specimen Description   Final    BLOOD LEFT HAND Performed at [place] [address], [place], [place] [address]    Special Requests   Final    BOTTLES DRAWN AEROBIC ONLY Blood Culture results may not be optimal due to an excessive volume of blood received in culture bottles Performed at [place] [address] [place], [place] [address]    Culture   Final    NO GROWTH 5 DAYS Performed at [facility], [place] [address], [place], [place] [address]    Report Status 12/07/2019 FINAL  Final  Blood culture (routine x 2)     Status: None (Preliminary result)   Collection Time: 12/05/19  5:07 PM   Specimen: BLOOD  Result Value Ref Range Status   Specimen Description   Final    BLOOD RIGHT ANTECUBITAL Performed at [facility], [place] [address] [place], [place] [address]    Special Requests   Final    BOTTLES DRAWN AEROBIC AND ANAEROBIC Blood Culture adequate volume Performed at [place] [address] [place], [place] [address]    Culture   Final    NO GROWTH 3 DAYS Performed at [facility], [place] [address], [place], [place] [address]    Report Status PENDING  Incomplete  Blood culture (routine x 2)     Status: None (Preliminary result)   Collection Time: 12/05/19  5:08 PM   Specimen: BLOOD  Result Value Ref Range Status   Specimen Description   Final    BLOOD LEFT ANTECUBITAL Performed at [facility], [place] [address], [place], [place] [address]    Special Requests   Final    BOTTLES DRAWN AEROBIC AND ANAEROBIC Blood Culture adequate volume Performed at [place] [address], [place], [place] [address]     Culture   Final    NO GROWTH 3 DAYS Performed at [facility], [place] [address], [place], [place] [address]    Report Status PENDING  Incomplete  SARS Coronavirus 2 by RT PCR (hospital order, performed in [facility] hospital lab) Nasopharyngeal Nasopharyngeal Swab     Status: None   Collection Time: 12/05/19  5:22 PM   Specimen: Nasopharyngeal Swab  Result Value Ref Range Status   SARS Coronavirus 2 NEGATIVE NEGATIVE Final    Comment: (NOTE) SARS-CoV-2 target nucleic acids are NOT DETECTED.  The SARS-CoV-2 RNA is generally detectable in upper and lower respiratory specimens during the acute phase of infection. The lowest concentration of SARS-CoV-2 viral copies this assay can detect is 250 copies / mL. A negative result does not preclude SARS-CoV-2 infection and should not be used as the sole basis for treatment or other patient management decisions.  A negative result may occur with improper specimen collection / handling, submission of specimen other than nasopharyngeal swab, presence of viral mutation(s) within the areas  targeted by this assay, and inadequate number of viral copies (<250 copies / mL). A negative result must be combined with clinical observations, patient history, and epidemiological information.  Fact Sheet for Patients:   StrictlyIdeas.no  Fact Sheet for Healthcare Providers: BankingDealers.co.za  This test is not yet approved or  cleared by the [place] FDA and has been authorized for detection and/or diagnosis of SARS-CoV-2 by FDA under an Emergency Use Authorization (EUA).  This EUA will remain in effect (meaning this test can be used) for the duration of the COVID-19 declaration under Section 564(b)(1) of the Act, 21 U.S.C. section 360bbb-3(b)(1), unless the authorization is terminated or revoked sooner.  Performed at [facility], [place] [address], [place], [place] [address]           Radiology Studies: No results found.      Scheduled Meds: . acetaminophen  650 mg Oral Q8H   Or  . acetaminophen  650 mg Rectal Q8H  . amoxicillin-clavulanate  1 tablet Oral BID  . atorvastatin  5 mg Oral Daily  . bethanechol  10 mg Oral TID  . buPROPion  150 mg Oral Daily  . calcium-vitamin D  1 tablet Oral Daily  . enoxaparin (LOVENOX) injection  30 mg Subcutaneous Q24H  . guaiFENesin  600 mg Oral BID  . levothyroxine  88 mcg Oral Q0600  . memantine  14 mg Oral Daily  . metoprolol tartrate  50 mg Oral BID  . mirtazapine  15 mg Oral QHS  . saccharomyces boulardii  250 mg Oral BID   Continuous Infusions: . sodium chloride 50 mL/hr at 12/08/19 0034     LOS: 2 days        Hosie Poisson, MD [facility]   To contact the attending provider between 7A-7P or the covering provider during after hours 7P-7A, please log into the web site [email or web address] and access using universal [place] password for that web site. If you do not have the password, please call the hospital operator.  12/08/2019, 2:02 PM

## 2019-12-08 NOTE — Consult Note (Signed)
Palliative care consult note  Reason for consult: Goals of care in light of advanced dementia with recurrent hospitalizations  Palliative care consult received.  Chart reviewed including personal review of pertinent labs and imaging.  Briefly, Ms. Shinall is a 86 year old female with past medical history of dementia, hyperlipidemia, hypothyroidism, thoracic vertebral compression fracture and recent hospitalization from September 15-18 for multifocal pneumonia who returned to the hospital following fall and hypoxemia.  Her daughter has been struggling with determine the best way to care for her mother moving forward.  Palliative consulted for goals of care.  I saw and examined Ms. Thorley today.  She was awake and alert in the bedside chair but was not cooperative to conversation or examination.  Her daughters, [name], is at the bedside reports she is "having a bad day."  [name] reports that her mother has continued to decline from advancing dementia with progression and loss of her cognition, nutrition, and functional status.  She has been living at memory care unit but they have noticed a steep decline in these over the past several months.  She believes that her mother is, "tired and ready to go."   We discussed clinical course over the past couple of weeks with recurrent hospitalizations as well as wishes moving forward in regard to advanced directives.  Concepts specific to code status and rehospitalization discussed.  We discussed difference between a aggressive medical intervention path and a palliative, comfort focused care path.  Values and goals of care important to patient and family were attempted to be elicited.  Concept of Hospice and Palliative Care were discussed  Questions and concerns addressed.   PMT will continue to support holistically.  Recommendations: -DNR/DNI -Goal moving forward is for full comfort, avoiding hospitalization, and getting her back to a familiar environment  to unable to her to live out the rest of her life with a focus on comfort and dignity.  She would like to work to transition her mother back to [facility] memory care unit as she knows this is her home.  We discussed transition back to [name] with hospice support.  Social work has reached out to them and as long as she is transitioning back with hospice support and a goal for comfort, they are able to accommodate her at this time. -We completed a MOST form today.  DNR, comfort care, antibiotics to be determined at time of infection, no IV fluid, no feeding tube. -Patient has had significant changes in her functional status and now has been readmitted to the hospital twice this month with multifocal pneumonia.  While her overall fast score would not be advanced enough to be indicative of hospice eligibility by itself, I believe her recurrent hospitalizations and recurrent multifocal pneumonia portend her to have a prognosis of less than 6 months if her disease follows its natural course. -Plan for transition back to [name] memory care unit with hospice support through [employer].  Start time: 1420 End time: 1530 Total time: 70 minutes  Greater than 50%  of this time was spent counseling and coordinating care related to the above assessment and plan.  Micheline Rough, MD [place] Team [phone]

## 2019-12-09 LAB — RESPIRATORY PANEL BY RT PCR (FLU A&B, COVID)
Influenza A by PCR: NEGATIVE
Influenza B by PCR: NEGATIVE
SARS Coronavirus 2 by RT PCR: NEGATIVE

## 2019-12-09 MED ORDER — BETHANECHOL CHLORIDE 10 MG PO TABS
10.0000 mg | ORAL_TABLET | Freq: Three times a day (TID) | ORAL | 1 refills | Status: DC
Start: 2019-12-09 — End: 2020-05-11

## 2019-12-09 NOTE — TOC Transition Note (Signed)
Transition of Care [facility]) - CM/SW Discharge Note   Patient Details  Name: Lauren [name] MRN: [id number] Date of Birth: Mar 11, 1925  Transition of [facility] CM/SW Contact:  Lennart Pall, LCSW Phone Number: 12/09/2019, 1:41 PM   Clinical Narrative:   Pt has been accepted for Hospice services under [facility] and they will follow pt on her return to Memory Care at [facility].  Daughter very happy with these arrangements and pt pleased with return to [facility].  Have arranged PTAR for pick up this afternoon.  [facility] who has reviewed all documents and awaiting pt's return. No further TOC needs.   Final next level of care: Memory Care (with hospice to follow.) Barriers to Discharge: Barriers Resolved   Patient Goals and CMS Choice   CMS Medicare.gov Compare Post Acute Care list provided to:: Patient Choice offered to / list presented to : Adult Children  Discharge Placement              Patient chooses bed at: [facility] (returning to [place]) Patient to be transferred to facility by: [place] Name of family member notified: daughter Patient and family notified of of transfer: 12/09/19  Discharge Plan and Services In-house Referral: Clinical Social Work   Post Acute Care Choice: [place]          DME Arranged: N/A DME Agency: NA       HH Arranged: NA [place] Agency: Hospice and [place] Date [place]: 12/08/19 Time [place]: [place] Representative spoke with at [place]: [place] Determinants of Health ([place]) Interventions     Readmission Risk Interventions No flowsheet data found.

## 2019-12-09 NOTE — NC FL2 (Signed)
[place] LEVEL OF CARE SCREENING TOOL     IDENTIFICATION  Patient Name: Lauren [name] Birthdate: 04-Jun-1924 Sex: female Admission Date (Current Location): 12/05/2019  [facility] and [place] Number:  Herbalist and Address:  [facility],  [address], [place]      Provider Number: [id number]  Attending Physician Name and Address:  Hosie Poisson, MD  Relative Name and Phone Number:  [id number] Daughter [phone]    Current Level of Care: Hospital Recommended Level of Care: Memory Care Prior Approval Number:    Date Approved/Denied: 12/07/19 PASRR Number: [id number]  Discharge Plan: Other (Comment) (Memory Care)    Current Diagnoses: Patient Active Problem List   Diagnosis Date Noted  . Acute respiratory failure with hypoxemia ([place]) 12/05/2019  . Acute hypoxemic respiratory failure ([place]) 12/05/2019  . HCAP (healthcare-associated pneumonia) 12/02/2019  . CAP (community acquired pneumonia) 12/01/2019  . Respiratory failure ([place]) 12/01/2019  . Hypotension 12/01/2019  . CKD (chronic kidney disease) stage 3, GFR 30-59 ml/min 09/01/2017  . Osteoporosis 10/21/2016  . Weight gain 04/16/2016  . Hypocalcemia 04/16/2016  . Hypothyroidism 04/16/2016  . Edema of right foot 04/16/2016  . Dementia ([place]) 10/31/2015  . 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 Loss of weight 03/24/2014  . Hypertension   . High cholesterol   . Anxiety     Orientation RESPIRATION BLADDER Height & Weight     Self  O2 (2L) Continent Weight: 118 lb 2.7 oz (53.6 kg) Height:  5\' 8"  (172.7 cm)  BEHAVIORAL SYMPTOMS/MOOD NEUROLOGICAL BOWEL NUTRITION STATUS      Continent Diet  AMBULATORY STATUS COMMUNICATION OF NEEDS Skin   Limited Assist Verbally Normal                       Personal Care Assistance Level of Assistance  Bathing, Dressing Bathing Assistance: Limited assistance Feeding assistance: Limited assistance Dressing  Assistance: Limited assistance     Functional Limitations Info  Sight, Hearing, Speech Sight Info: Adequate Hearing Info: Adequate Speech Info: Adequate    SPECIAL CARE FACTORS FREQUENCY  PT (By licensed PT), OT (By licensed OT)     PT Frequency: 5x/wk OT Frequency: 5x/wk            Contractures Contractures Info: Not present    Additional Factors Info  Code Status, Allergies, Psychotropic Code Status Info: DNR Allergies Info: see MAR Psychotropic Info: see MAR         Current Medications (12/09/2019):  This is the current hospital active medication list Current Facility-Administered Medications  Medication Dose Route Frequency Provider Last Rate Last Admin  . 0.45 % sodium chloride infusion   Intravenous Continuous Roney Jaffe, MD 50 mL/hr at 12/09/19 0230 New Bag at 12/09/19 0230  . acetaminophen (TYLENOL) tablet 650 mg  650 mg Oral Q8H Green, Terri L, RPH   650 mg at 12/08/19 0644   Or  . acetaminophen (TYLENOL) suppository 650 mg  650 mg Rectal Q8H Green, Terri L, RPH      . amoxicillin-clavulanate (AUGMENTIN) 500-125 MG per tablet 500 mg  1 tablet Oral BID Wendee Beavers T, MD   500 mg at 12/09/19 1027  . atorvastatin (LIPITOR) tablet 5 mg  5 mg Oral Daily Roney Jaffe, MD   5 mg at 12/09/19 1027  . bethanechol (URECHOLINE) tablet 10 mg  10 mg Oral TID Wendee Beavers T, MD   10 mg at 12/09/19 1027  . buPROPion (WELLBUTRIN XL) 24 hr  tablet 150 mg  150 mg Oral Daily Roney Jaffe, MD   150 mg at 12/09/19 1028  . calcium-vitamin D (OSCAL WITH D) 500-200 MG-UNIT per tablet 1 tablet  1 tablet Oral Daily Roney Jaffe, MD   1 tablet at 12/06/19 1043  . enoxaparin (LOVENOX) injection 30 mg  30 mg Subcutaneous Q24H Roney Jaffe, MD   30 mg at 12/09/19 1028  . guaiFENesin (MUCINEX) 12 hr tablet 600 mg  600 mg Oral BID Roney Jaffe, MD   600 mg at 12/09/19 1028  . levothyroxine (SYNTHROID) tablet 88 mcg  88 mcg Oral Q0600 Roney Jaffe, MD   88 mcg at 12/08/19 0644   . memantine (NAMENDA XR) 24 hr capsule 14 mg  14 mg Oral Daily Roney Jaffe, MD   14 mg at 12/09/19 1027  . metoprolol tartrate (LOPRESSOR) tablet 50 mg  50 mg Oral BID Roney Jaffe, MD   50 mg at 12/09/19 1027  . mirtazapine (REMERON) tablet 15 mg  15 mg Oral QHS Roney Jaffe, MD   15 mg at 12/07/19 2007  . oxyCODONE (Oxy IR/ROXICODONE) immediate release tablet 5 mg  5 mg Oral Q8H PRN Wendee Beavers T, MD   5 mg at 12/07/19 1008  . saccharomyces boulardii (FLORASTOR) capsule 250 mg  250 mg Oral BID Roney Jaffe, MD   250 mg at 12/09/19 1027     Discharge Medications: Please see discharge summary for a list of discharge medications.  Relevant Imaging Results:  Relevant Lab Results:   Additional Information SSN [id number]  Lennart Pall, LCSW

## 2019-12-09 NOTE — Care Management Important Message (Signed)
Important Message  Patient Details IM Letter given to the Patient Name: Lauren [name] MRN: [id number] Date of Birth: 1924-10-10   Medicare Important Message Given:  Yes     [name] 12/09/2019, 11:05 AM

## 2019-12-09 NOTE — Progress Notes (Signed)
Pt was alert and aware and pleasant. She states she is doing pretty good and she slept well. The chaplain offered caring presence and sacred music. Further visits will be offered.

## 2019-12-09 NOTE — Discharge Summary (Signed)
Physician Discharge Summary  Lauren [name] DGL:[id number] DOB: 09/24/1924 DOA: 12/05/2019  PCP: Patient, No Pcp Per  Admit date: 12/05/2019 Discharge date: 12/09/2019  Admitted From: HOME.  Disposition:  [facility]  Recommendations for Outpatient Follow-up:  1. Follow up with PCP in 1-2 weeks 2. Please obtain BMP/CBC in one week 3. Please follow up hospice services at [facility] upon discharge    Discharge Condition:Hospice.  CODE STATUS:DNR Diet recommendation: Heart Healthy   Brief/Interim Summary: 86 year old lady prior history of dementia, hyperlipidemia, hypothyroidism, thoracic vertebral compression fractures and recent hospitalization from September 15-18 due to multifocal pneumonia returns from fall and hypoxemia. CT angiogram revealed multifocal pneumonia, unstable T10 fracture.  Negative for PE.  She was started on broad-spectrum IV antibiotics.  Patient continued to improve and her antibiotics transition to oral Augmentin to complete the course.  Plan for nasal cannula oxygen on discharge to maintain oxygen levels greater than 90%. PT OT evaluation recommending SNF.  Discharge Diagnoses:  Principal Problem:   Acute respiratory failure with hypoxemia (HCC) Active Problems:   Hypertension   Anxiety   Dementia (HCC)   Hypothyroidism   CKD (chronic kidney disease) stage 3, GFR 30-59 ml/min   HCAP (healthcare-associated pneumonia)   Acute hypoxemic respiratory failure (HCC)  Acute hypoxemic respiratory failure:  Probably secondary to multifocal pneumonia and deconditioning.  CTA negative for PE.  Continues to have 2 lit of [place] oxygen to keep sats greater than 95% Continue with augmentin to complete the course.      Stable T10 compression fracture with new L1 compression fracture Currently patient is pain-free Tylenol as needed and continue with calcium and vitamin D.    Dementia with behavioral disturbances Refusing care refusing  medications and sometimes nasal cannula oxygen.  Continue with reorientation and delirium precautions     Anxiety and depression Stable continue with home medications    Hypothyroidism Continue with Synthroid.   Essential hypertension continue with home medications.   Stage IIIb CKD Creatinine appears to be at baseline at this time.   Physical deconditioning and debility PT evaluation recommending SNF.   Due to patient's recurrent admissions, deconditioning, debility and poor progression daughter is open to hospice at [facility].     Discharge Instructions  Discharge Instructions    Diet - low sodium heart healthy   Complete by: As directed    Diet general   Complete by: As directed    Discharge instructions   Complete by: As directed    Please follow up with Hospice MD at [facility].   Increase activity slowly   Complete by: As directed    Increase activity slowly   Complete by: As directed      Allergies as of 12/09/2019      Reactions   Aricept [donepezil Hcl] Other (See Comments)   Unknown reaction   Aspirin Other (See Comments)   Upset stomach       Medication List    TAKE these medications   acetaminophen 500 MG tablet Commonly known as: TYLENOL Take 1 tablet (500 mg total) by mouth every 8 (eight) hours.   amoxicillin-clavulanate 500-125 MG tablet Commonly known as: Augmentin Take 1 tablet (500 mg total) by mouth 2 (two) times daily for 6 days.   atorvastatin 10 MG tablet Commonly known as: LIPITOR Take 0.5 tablets (5 mg total) by mouth daily.   bethanechol 10 MG tablet Commonly known as: URECHOLINE Take 1 tablet (10 mg total) by mouth 3 (three) times  daily.   buPROPion 150 MG 24 hr tablet Commonly known as: WELLBUTRIN XL TAKE 1 TABLET DAILY IN THE MORNING FOR ANXIETY What changed: See the new instructions.   Calcium + Vitamin D3 600-400 MG-UNIT Tabs Generic drug: Calcium Carbonate-Vitamin D3 Take 1 tablet by mouth  daily.   ENSURE PO Take 1 Can by mouth in the morning and at bedtime.   guaiFENesin 600 MG 12 hr tablet Commonly known as: MUCINEX Take 1 tablet (600 mg total) by mouth 2 (two) times daily.   levothyroxine 88 MCG tablet Commonly known as: SYNTHROID Take 88 mcg by mouth daily.   memantine 14 MG Cp24 24 hr capsule Commonly known as: NAMENDA XR TAKE 1 CAPSULE DAILY FOR MEMORY What changed: See the new instructions.   metoprolol tartrate 50 MG tablet Commonly known as: LOPRESSOR TAKE 1 TABLET TWICE A DAY   mirtazapine 15 MG tablet Commonly known as: Remeron Take 1 tablet (15 mg total) by mouth at bedtime.   Saccharomyces boulardii 250 MG Pack Take 250 mg by mouth 2 (two) times daily for 10 days.            Durable Medical Equipment  (From admission, onward)         Start     Ordered   12/06/19 1429  For home use only DME oxygen  Once       Question Answer Comment  Length of Need 6 Months   Mode or (Route) Nasal cannula   Liters per Minute 2   Frequency Continuous (stationary and portable oxygen unit needed)   Oxygen delivery system Gas      12/06/19 1428          Allergies  Allergen Reactions  . Aricept [Donepezil Hcl] Other (See Comments)    Unknown reaction  . Aspirin Other (See Comments)    Upset stomach     Consultations: Palliative care   Procedures/Studies: CT CHEST WO CONTRAST  Result Date: 12/02/2019 CLINICAL DATA:  86 year old female with history of respiratory failure. EXAM: CT CHEST WITHOUT CONTRAST TECHNIQUE: Multidetector CT imaging of the chest was performed following the standard protocol without IV contrast. COMPARISON:  No priors. FINDINGS: Cardiovascular: Heart size is borderline enlarged. There is no significant pericardial fluid, thickening or pericardial calcification. There is aortic atherosclerosis, as well as atherosclerosis of the great vessels of the mediastinum and the coronary arteries, including calcified atherosclerotic  plaque in the left anterior descending, left circumflex and right coronary arteries. Aneurysmal dilatation of the ascending thoracic aorta (4.7 cm in diameter). 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 Mediastinum/Nodes: No pathologically enlarged mediastinal or hilar lymph nodes. Please note that accurate exclusion of hilar adenopathy is limited on noncontrast CT scans. Esophagus is unremarkable in appearance. No axillary lymphadenopathy. Lungs/Pleura: Widespread areas of ground-glass attenuation, septal thickening, thickening of the peribronchovascular interstitium and regional areas of masslike airspace consolidation scattered randomly throughout the lungs bilaterally. Multiple air bronchograms within these consolidated regions. Small left pleural effusion lying dependently. Large calcified pleural plaque in the anterior aspect of the left hemithorax incidentally noted. Upper Abdomen: Aortic atherosclerosis. Musculoskeletal: Chronic compression fracture of T10 vertebral body with 50% loss of anterior vertebral body height. There are no aggressive appearing lytic or blastic lesions noted in the visualized portions of the skeleton. IMPRESSION: 1. The appearance the chest is most compatible with severe multilobar bilateral pneumonia. 2. Small left parapneumonic pleural effusion lying dependently. 3. Calcified pleural plaque in the left hemithorax anteriorly, presumably related to  remote infection or remote trauma. 4. Aortic atherosclerosis, in addition to 3 vessel coronary artery disease. 5. Aneurysmal dilatation of the ascending thoracic aorta (4.7 cm in diameter). Ascending thoracic aortic aneurysm. Recommend semi-annual imaging followup by CTA or MRA and referral to cardiothoracic surgery if not already obtained. This recommendation follows 2010 ACCF/AHA/AATS/ACR/ASA/SCA/SCAI/SIR/STS/SVM Guidelines for the Diagnosis and Management of Patients With Thoracic Aortic Disease.  Circulation. 2010; 121: R485-I627. Aortic aneurysm NOS (ICD10-I71.9). Aortic Atherosclerosis (ICD10-I70.0). Aortic aneurysm NOS (ICD10-I71.9). Electronically Signed   By: Vinnie Langton M.D.   On: 12/02/2019 13:50   DG Chest Port 1 View  Result Date: 12/05/2019 CLINICAL DATA:  Shortness of breath. Fall. Recently treated for pneumonia. EXAM: PORTABLE CHEST 1 VIEW COMPARISON:  Chest x-ray December 01, 2019. Chest CT December 02, 2019. FINDINGS: Multifocal infiltrates, left greater than right, are similar on the left in the interval. The infiltrate on the right is mildly increased at the base. No pneumothorax. No other changes. IMPRESSION: 1. Persistent multifocal infiltrates, stable on the left and mildly increased on the right in the interval most consistent with multifocal pneumonia given recent CT imaging. No other interval changes. Electronically Signed   By: Dorise Bullion III M.D   On: 12/05/2019 17:03   DG Chest Port 1 View  Result Date: 12/01/2019 CLINICAL DATA:  Cough, hypoxia. EXAM: PORTABLE CHEST 1 VIEW COMPARISON:  November 22, 2009. FINDINGS: Stable cardiomediastinal silhouette. Interval development left upper and lower lobe opacities concerning for pneumonia. Probable small left pleural effusion may be present. Minimal right basilar subsegmental atelectasis is noted. No pneumothorax is noted. Bony thorax is unremarkable. IMPRESSION: Interval development of left upper and lower lobe opacities concerning for pneumonia. Probable small left pleural effusion may be present. Minimal right basilar subsegmental atelectasis is noted. Electronically Signed   By: Marijo Conception M.D.   On: 12/01/2019 12:54   CT Angio Chest/Abd/Pel for Dissection W and/or Wo Contrast  Result Date: 12/05/2019 CLINICAL DATA:  Chest pain and lower back pain, history of thoracic aortic aneurysm. EXAM: CT ANGIOGRAPHY CHEST, ABDOMEN AND PELVIS TECHNIQUE: Non-contrast CT of the chest was initially obtained. Multidetector  CT imaging through the chest, abdomen and pelvis was performed using the standard protocol during bolus administration of intravenous contrast. Multiplanar reconstructed images and MIPs were obtained and reviewed to evaluate the vascular anatomy. CONTRAST:  57mL OMNIPAQUE IOHEXOL 350 MG/ML SOLN COMPARISON:  Noncontrast study from 12/02/2019 FINDINGS: CTA CHEST FINDINGS Cardiovascular: Initial precontrast images again demonstrate atherosclerotic calcification and aneurysmal dilatation of the ascending aorta to 4.7 cm, stable in appearance from the prior exam. No evidence of dissection is seen. 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 No significant cardiac enlargement is seen. Coronary calcifications are again seen. No pericardial effusion is noted. No sizable pulmonary embolus is seen. Mediastinum/Nodes: Thoracic inlet is within normal limits. No sizable hilar or mediastinal adenopathy is identified. The esophagus as visualized is within normal limits. Lungs/Pleura: Small left-sided pleural effusion is again seen and stable. Diffuse bilateral patchy airspace opacities are noted similar to that seen on the prior exam consistent with significant bilateral multilobar pneumonia. No new focal infiltrate is seen. No sizable pneumothorax is noted. Calcified pleural plaques are again noted and stable. Musculoskeletal: Degenerative changes of the thoracic spine are noted. Stable T10 compression deformity is seen. No definitive acute rib abnormality is seen. Review of the MIP images confirms the above findings. CTA ABDOMEN AND PELVIS FINDINGS VASCULAR Aorta: Abdominal aorta demonstrates diffuse atherosclerotic calcification without aneurysmal dilatation or dissection.  Celiac: Patent without evidence of aneurysm, dissection, vasculitis or significant stenosis. SMA: Patent without evidence of aneurysm, dissection, vasculitis or significant stenosis. Renals: Dual renal arteries are noted on the left with a single renal  artery on the right. No significant narrowing is noted. IMA: Patent without evidence of aneurysm, dissection, vasculitis or significant stenosis. Iliacs: Diffuse atherosclerotic changes are noted without focal stenosis or aneurysmal dilatation. Veins: No specific venous abnormality is seen. Review of the MIP images confirms the above findings. NON-VASCULAR Hepatobiliary: No focal liver abnormality is seen. Status post cholecystectomy. No biliary dilatation. Pancreas: Unremarkable. No pancreatic ductal dilatation or surrounding inflammatory changes. Spleen: Normal in size without focal abnormality. Adrenals/Urinary Tract: Adrenal glands are within normal limits. Kidneys demonstrate scattered cysts bilaterally. No obstructive changes are seen. No renal calculi are noted. The bladder is well distended. Stomach/Bowel: Scattered diverticular change of the colon is noted. The appendix is not well visualized although no inflammatory changes to suggest appendicitis are noted. No obstructive or inflammatory changes of the colon are seen. Small bowel and stomach appear within normal limits. Lymphatic: No sizable lymphadenopathy is noted. Reproductive: Uterus and bilateral adnexa are unremarkable. Other: No abdominal wall hernia or abnormality. No abdominopelvic ascites. Musculoskeletal: Mild L1 compression deformity is noted new from the previous exam. There is approximately 10% vertebral body height loss. No involvement of the posterior elements is seen. Scattered degenerative changes are noted. Old healed pubic rami fractures are noted bilaterally. Review of the MIP images confirms the above findings. IMPRESSION: CTA of the chest: No acute dissection is noted. Stable aneurysmal dilatation of the aorta to 4.7 cm is noted. Ascending thoracic aortic aneurysm. Recommend semi-annual imaging followup by CTA or MRA and referral to cardiothoracic surgery if not already obtained. This recommendation follows 2010  ACCF/AHA/AATS/ACR/ASA/SCA/SCAI/SIR/STS/SVM Guidelines for the Diagnosis and Management of Patients With Thoracic Aortic Disease. Circulation. 2010; 121: [id number]. Aortic aneurysm NOS (ICD10-I71.9) No evidence of pulmonary emboli. Patchy multifocal pneumonia bilaterally similar to that seen on the prior exam from 3 days previous. CTA of the abdomen and pelvis: No evidence of abdominal aortic aneurysm or dissection is noted. New L1 compression deformity is noted.  Mild height loss is noted. Diverticulosis without diverticulitis. Stable renal cysts bilaterally. Electronically Signed   By: Inez Catalina M.D.   On: 12/05/2019 19:39     Subjective: No complaints.   Discharge Exam: Vitals:   12/08/19 2056 12/09/19 0526  BP: 125/74 109/60  Pulse: 77 71  Resp: 18 18  Temp: 99.1 F (37.3 C) 99.7 F (37.6 C)  SpO2: 95% 93%   Vitals:   12/08/19 0501 12/08/19 1312 12/08/19 2056 12/09/19 0526  BP: 135/73 (!) 153/80 125/74 109/60  Pulse: 73 (!) 108 77 71  Resp: 16 18 18 18   Temp: 99 F (37.2 C) 98 F (36.7 C) 99.1 F (37.3 C) 99.7 F (37.6 C)  TempSrc:  Oral Oral Oral  SpO2: 95% 95% 95% 93%  Weight:      Height:        General: Pt is alert, awake, not in acute distress Cardiovascular: RRR, S1/S2 +, no rubs, no gallops Respiratory: CTA bilaterally, no wheezing, no rhonchi Abdominal: Soft, NT, ND, bowel sounds + Extremities: no edema, no cyanosis    The results of significant diagnostics from this hospitalization (including imaging, microbiology, ancillary and laboratory) are listed below for reference.     Microbiology: Recent Results (from the past 240 hour(s))  SARS Coronavirus 2 by RT PCR (hospital order, performed in [facility] hospital lab) Nasopharyngeal Nasopharyngeal Swab     Status: None   Collection Time: 12/01/19 12:40 PM   Specimen: Nasopharyngeal Swab  Result Value Ref Range Status   SARS Coronavirus 2 NEGATIVE NEGATIVE Final    Comment: (NOTE) SARS-CoV-2 target  nucleic acids are NOT DETECTED.  The SARS-CoV-2 RNA is generally detectable in upper and lower respiratory specimens during the acute phase of infection. The lowest concentration of SARS-CoV-2 viral copies this assay can detect is 250 copies / mL. A negative result does not preclude SARS-CoV-2 infection and should not be used as the sole basis for treatment or other patient management decisions.  A negative result may occur with improper specimen collection / handling, submission of specimen other than nasopharyngeal swab, presence of viral mutation(s) within the areas targeted by this assay, and inadequate number of viral copies (<250 copies / mL). A negative result must be combined with clinical observations, patient history, and epidemiological information.  Fact Sheet for Patients:   StrictlyIdeas.no  Fact Sheet for Healthcare Providers: BankingDealers.co.za  This test is not yet approved or  cleared by the [place] FDA and has been authorized for detection and/or diagnosis of SARS-CoV-2 by FDA under an Emergency Use Authorization (EUA).  This EUA will remain in effect (meaning this test can be used) for the duration of the COVID-19 declaration under Section 564(b)(1) of the Act, 21 U.S.C. section 360bbb-3(b)(1), unless the authorization is terminated or revoked sooner.  Performed at [facility], [place] [address] [place], [place] [address]   Respiratory Panel by PCR     Status: None   Collection Time: 12/01/19  6:55 PM   Specimen: Nasopharyngeal Swab; Respiratory  Result Value Ref Range Status   Adenovirus NOT DETECTED NOT DETECTED Final   Coronavirus 229E NOT DETECTED NOT DETECTED Final    Comment: (NOTE) The Coronavirus on the Respiratory Panel, DOES NOT test for the novel  Coronavirus (2019 nCoV)    Coronavirus HKU1 NOT DETECTED NOT DETECTED Final   Coronavirus NL63 NOT DETECTED NOT DETECTED Final    Coronavirus OC43 NOT DETECTED NOT DETECTED Final   Metapneumovirus NOT DETECTED NOT DETECTED Final   Rhinovirus / Enterovirus NOT DETECTED NOT DETECTED Final   Influenza A NOT DETECTED NOT DETECTED Final   Influenza B NOT DETECTED NOT DETECTED Final   Parainfluenza Virus 1 NOT DETECTED NOT DETECTED Final   Parainfluenza Virus 2 NOT DETECTED NOT DETECTED Final   Parainfluenza Virus 3 NOT DETECTED NOT DETECTED Final   Parainfluenza Virus 4 NOT DETECTED NOT DETECTED Final   Respiratory Syncytial Virus NOT DETECTED NOT DETECTED Final   Bordetella pertussis NOT DETECTED NOT DETECTED Final   Chlamydophila pneumoniae NOT DETECTED NOT DETECTED Final   Mycoplasma pneumoniae NOT DETECTED NOT DETECTED Final    Comment: Performed at [facility], [place]. [address] [place], [place] [address]  MRSA PCR Screening     Status: None   Collection Time: 12/02/19 12:30 AM   Specimen: Nasal Mucosa; Nasopharyngeal  Result Value Ref Range Status   MRSA by PCR NEGATIVE NEGATIVE Final    Comment:        The GeneXpert MRSA Assay (FDA approved for NASAL specimens only), is one component of a comprehensive MRSA colonization surveillance program. It is not intended to diagnose MRSA infection nor to guide or monitor treatment for MRSA infections. Performed at [facility], [place] [address] [place],  [address]   Culture, blood (routine x 2)  Status: None   Collection Time: 12/02/19  3:03 PM   Specimen: BLOOD  Result Value Ref Range Status   Specimen Description   Final    BLOOD RIGHT ANTECUBITAL Performed at [facility] [address] [place], [place] [address]    Special Requests   Final    BOTTLES DRAWN AEROBIC AND ANAEROBIC Blood Culture adequate volume Performed at [facility] [address], [place], [place] [address]    Culture   Final    NO GROWTH 5 DAYS Performed at [facility], [place] [address] [place],  [place] [address]    Report Status 12/07/2019 FINAL  Final  Culture, blood (routine x 2)     Status: None   Collection Time: 12/02/19  3:03 PM   Specimen: BLOOD LEFT HAND  Result Value Ref Range Status   Specimen Description   Final    BLOOD LEFT HAND Performed at [facility] [address] [place], [place] [address]    Special Requests   Final    BOTTLES DRAWN AEROBIC ONLY Blood Culture results may not be optimal due to an excessive volume of blood received in culture bottles Performed at [facility] [address] [place], [place] [address]    Culture   Final    NO GROWTH 5 DAYS Performed at [facility], [place] [address], [place], [place] [address]    Report Status 12/07/2019 FINAL  Final  Blood culture (routine x 2)     Status: None (Preliminary result)   Collection Time: 12/05/19  5:07 PM   Specimen: BLOOD  Result Value Ref Range Status   Specimen Description   Final    BLOOD RIGHT ANTECUBITAL Performed at [facility], [place] [address] [place], [place] [address]    Special Requests   Final    BOTTLES DRAWN AEROBIC AND ANAEROBIC Blood Culture adequate volume Performed at [facility] [address] [place], [place] [address]    Culture   Final    NO GROWTH 3 DAYS Performed at [facility], [place] [address], [place], [place] [address]    Report Status PENDING  Incomplete  Blood culture (routine x 2)     Status: None (Preliminary result)   Collection Time: 12/05/19  5:08 PM   Specimen: BLOOD  Result Value Ref Range Status   Specimen Description   Final    BLOOD LEFT ANTECUBITAL Performed at [facility], [place] [address] [place], [place] [address]    Special Requests   Final    BOTTLES DRAWN AEROBIC AND ANAEROBIC Blood Culture adequate volume Performed at [place] [address], [place], [place] [address]    Culture   Final    NO GROWTH 3 DAYS Performed at [facility], [place] [address], [place], [place] [address]    Report Status PENDING  Incomplete  SARS Coronavirus 2 by RT PCR (hospital order, performed in [facility] hospital lab) Nasopharyngeal Nasopharyngeal Swab     Status: None   Collection Time: 12/05/19  5:22 PM   Specimen: Nasopharyngeal Swab  Result Value Ref Range Status   SARS Coronavirus 2 NEGATIVE NEGATIVE Final    Comment: (NOTE) SARS-CoV-2 target nucleic acids are NOT DETECTED.  The SARS-CoV-2 RNA is generally detectable in upper and lower respiratory specimens during the acute phase of infection. The lowest concentration of SARS-CoV-2 viral copies this assay  can detect is 250 copies / mL. A negative result does not preclude SARS-CoV-2 infection and should not be used as the sole basis for treatment or other patient management decisions.  A negative result may occur with improper specimen collection / handling, submission of specimen other than nasopharyngeal swab, presence of viral mutation(s) within the areas targeted by this assay, and inadequate number of viral copies (<250 copies / mL). A negative result must be combined with clinical observations, patient history, and epidemiological information.  Fact Sheet for Patients:   StrictlyIdeas.no  Fact Sheet for Healthcare Providers: BankingDealers.co.za  This test is not yet approved or  cleared by the [place] FDA and has been authorized for detection and/or diagnosis of SARS-CoV-2 by FDA under an Emergency Use Authorization (EUA).  This EUA will remain in effect (meaning this test can be used) for the duration of the COVID-19 declaration under Section 564(b)(1) of the Act, 21 U.S.C. section 360bbb-3(b)(1), unless the authorization is terminated or revoked sooner.  Performed at [facility], [place] [address], [place], [place] [address]      Labs: BNP (last 3 results) No results for  input(s): BNP in the last 8760 hours. Basic Metabolic Panel: Recent Labs  Lab 12/03/19 0502 12/03/19 0502 12/04/19 0756 12/05/19 1708 12/05/19 1720 12/07/19 0338 12/08/19 0340  NA 140   < > 141 141 140 140 141  K 3.5   < > 3.6 5.1 4.9 3.6 3.8  CL 106   < > 105 104 104 104 104  CO2 25  --  25  --  24 25 24   GLUCOSE 91   < > 93 115* 116* 99 91  BUN 19   < > 15 17 14 19 14   CREATININE 1.12*   < > 1.07* 1.00 1.11* 1.16* 0.98  CALCIUM 7.9*  --  8.2*  --  8.6* 8.2* 8.1*  MG  --   --  1.7  --   --  1.9 1.7  PHOS  --   --   --   --   --  5.0* 3.9   < > = values in this interval not displayed.   Liver Function Tests: Recent Labs  Lab 12/05/19 1720 12/07/19 0338 12/08/19 0340  AST 49*  --   --   ALT 15  --   --   ALKPHOS 76  --   --   BILITOT 1.4*  --   --   PROT 6.7  --   --   ALBUMIN 3.1* 2.6* 2.7*   No results for input(s): LIPASE, AMYLASE in the last 168 hours. No results for input(s): AMMONIA in the last 168 hours. CBC: Recent Labs  Lab 12/03/19 0502 12/03/19 0502 12/04/19 0756 12/04/19 0756 12/05/19 1708 12/05/19 1720 12/06/19 0338 12/07/19 0338 12/08/19 0340  WBC 6.3   < > 5.4  --   --  16.6* 12.6* 9.6 10.1  NEUTROABS 3.8  --  3.3  --   --  13.8*  --   --   --   HGB 10.4*   < > 11.1*   < > 11.6* 12.2 10.9* 10.6* 12.2  HCT 33.3*   < > 35.4*   < > 34.0* 38.2 33.6* 33.6* 39.3  MCV 95.4   < > 95.4  --   --  93.9 94.6 96.6 95.6  PLT 159   < > 161  --   --  177 164 152 126*   < > = values  in this interval not displayed.   Cardiac Enzymes: No results for input(s): CKTOTAL, CKMB, CKMBINDEX, TROPONINI in the last 168 hours. BNP: Invalid input(s): POCBNP CBG: Recent Labs  Lab 12/03/19 1146  GLUCAP 89   D-Dimer No results for input(s): DDIMER in the last 72 hours. Hgb A1c No results for input(s): HGBA1C in the last 72 hours. Lipid Profile No results for input(s): CHOL, HDL, LDLCALC, TRIG, CHOLHDL, LDLDIRECT in the last 72 hours. Thyroid function studies No  results for input(s): TSH, T4TOTAL, T3FREE, THYROIDAB in the last 72 hours.  Invalid input(s): FREET3 Anemia work up No results for input(s): VITAMINB12, FOLATE, FERRITIN, TIBC, IRON, RETICCTPCT in the last 72 hours. Urinalysis    Component Value Date/Time   COLORURINE YELLOW 10/11/2015 1250   APPEARANCEUR CLEAR 10/11/2015 1250   LABSPEC 1.016 10/11/2015 1250   PHURINE 5.5 10/11/2015 1250   GLUCOSEU NEGATIVE 10/11/2015 1250   HGBUR SMALL (A) 10/11/2015 1250   BILIRUBINUR NEGATIVE 10/11/2015 1250   KETONESUR NEGATIVE 10/11/2015 1250   PROTEINUR NEGATIVE 10/11/2015 1250   UROBILINOGEN 0.2 02/12/2014 1015   NITRITE NEGATIVE 10/11/2015 1250   LEUKOCYTESUR NEGATIVE 10/11/2015 1250   Sepsis Labs Invalid input(s): PROCALCITONIN,  WBC,  LACTICIDVEN Microbiology Recent Results (from the past 240 hour(s))  SARS Coronavirus 2 by RT PCR (hospital order, performed in [place] hospital lab) Nasopharyngeal Nasopharyngeal Swab     Status: None   Collection Time: 12/01/19 12:40 PM   Specimen: Nasopharyngeal Swab  Result Value Ref Range Status   SARS Coronavirus 2 NEGATIVE NEGATIVE Final    Comment: (NOTE) SARS-CoV-2 target nucleic acids are NOT DETECTED.  The SARS-CoV-2 RNA is generally detectable in upper and lower respiratory specimens during the acute phase of infection. The lowest concentration of SARS-CoV-2 viral copies this assay can detect is 250 copies / mL. A negative result does not preclude SARS-CoV-2 infection and should not be used as the sole basis for treatment or other patient management decisions.  A negative result may occur with improper specimen collection / handling, submission of specimen other than nasopharyngeal swab, presence of viral mutation(s) within the areas targeted by this assay, and inadequate number of viral copies (<250 copies / mL). A negative result must be combined with clinical observations, patient history, and epidemiological information.  Fact  Sheet for Patients:   StrictlyIdeas.no  Fact Sheet for Healthcare Providers: BankingDealers.co.za  This test is not yet approved or  cleared by the [place] FDA and has been authorized for detection and/or diagnosis of SARS-CoV-2 by FDA under an Emergency Use Authorization (EUA).  This EUA will remain in effect (meaning this test can be used) for the duration of the COVID-19 declaration under Section 564(b)(1) of the Act, 21 U.S.C. section 360bbb-3(b)(1), unless the authorization is terminated or revoked sooner.  Performed at [facility], [place] [address] [place], [place] [address]   Respiratory Panel by PCR     Status: None   Collection Time: 12/01/19  6:55 PM   Specimen: Nasopharyngeal Swab; Respiratory  Result Value Ref Range Status   Adenovirus NOT DETECTED NOT DETECTED Final   Coronavirus 229E NOT DETECTED NOT DETECTED Final    Comment: (NOTE) The Coronavirus on the Respiratory Panel, DOES NOT test for the novel  Coronavirus (2019 nCoV)    Coronavirus HKU1 NOT DETECTED NOT DETECTED Final   Coronavirus NL63 NOT DETECTED NOT DETECTED Final   Coronavirus OC43 NOT DETECTED NOT DETECTED Final   Metapneumovirus NOT DETECTED NOT DETECTED Final  Rhinovirus / Enterovirus NOT DETECTED NOT DETECTED Final   Influenza A NOT DETECTED NOT DETECTED Final   Influenza B NOT DETECTED NOT DETECTED Final   Parainfluenza Virus 1 NOT DETECTED NOT DETECTED Final   Parainfluenza Virus 2 NOT DETECTED NOT DETECTED Final   Parainfluenza Virus 3 NOT DETECTED NOT DETECTED Final   Parainfluenza Virus 4 NOT DETECTED NOT DETECTED Final   Respiratory Syncytial Virus NOT DETECTED NOT DETECTED Final   Bordetella pertussis NOT DETECTED NOT DETECTED Final   Chlamydophila pneumoniae NOT DETECTED NOT DETECTED Final   Mycoplasma pneumoniae NOT DETECTED NOT DETECTED Final    Comment: Performed at [facility], [place] [address]  [place], [place] [address]  MRSA PCR Screening     Status: None   Collection Time: 12/02/19 12:30 AM   Specimen: Nasal Mucosa; Nasopharyngeal  Result Value Ref Range Status   MRSA by PCR NEGATIVE NEGATIVE Final    Comment:        The GeneXpert MRSA Assay (FDA approved for NASAL specimens only), is one component of a comprehensive MRSA colonization surveillance program. It is not intended to diagnose MRSA infection nor to guide or monitor treatment for MRSA infections. Performed at [facility], [place] [address] [place], [place] [address]   Culture, blood (routine x 2)     Status: None   Collection Time: 12/02/19  3:03 PM   Specimen: BLOOD  Result Value Ref Range Status   Specimen Description   Final    BLOOD RIGHT ANTECUBITAL Performed at [place] [address], [place], [place] [address]    Special Requests   Final    BOTTLES DRAWN AEROBIC AND ANAEROBIC Blood Culture adequate volume Performed at [place] [address] [place], [place] [address]    Culture   Final    NO GROWTH 5 DAYS Performed at [facility], [place] [address] [place], [place] [address]    Report Status 12/07/2019 FINAL  Final  Culture, blood (routine x 2)     Status: None   Collection Time: 12/02/19  3:03 PM   Specimen: BLOOD LEFT HAND  Result Value Ref Range Status   Specimen Description   Final    BLOOD LEFT HAND Performed at [place] [address], [place], [place] [address]    Special Requests   Final    BOTTLES DRAWN AEROBIC ONLY Blood Culture results may not be optimal due to an excessive volume of blood received in culture bottles Performed at [place] [address] [place], [place] [address]    Culture   Final    NO GROWTH 5 DAYS Performed at [facility], [place] [address] [place], [place] [address]    Report Status 12/07/2019 FINAL  Final  Blood culture (routine x 2)      Status: None (Preliminary result)   Collection Time: 12/05/19  5:07 PM   Specimen: BLOOD  Result Value Ref Range Status   Specimen Description   Final    BLOOD RIGHT ANTECUBITAL Performed at [facility], [place] [address], [place], [place] [address]    Special Requests   Final    BOTTLES DRAWN AEROBIC AND ANAEROBIC Blood Culture adequate volume Performed at [place] [address], [place], [place] [address]    Culture   Final    NO GROWTH 3 DAYS Performed at [facility], [place]  [address], [place], [place] [address]    Report Status PENDING  Incomplete  Blood culture (routine x 2)     Status: None (Preliminary result)   Collection Time: 12/05/19  5:08 PM   Specimen: BLOOD  Result Value Ref Range Status   Specimen Description   Final    BLOOD LEFT ANTECUBITAL Performed at [facility], [place] [address], [place], [place] [address]    Special Requests   Final    BOTTLES DRAWN AEROBIC AND ANAEROBIC Blood Culture adequate volume Performed at [place] [address], [place], [place] [address]    Culture   Final    NO GROWTH 3 DAYS Performed at [facility], [place] [address], [place], [place] [address]    Report Status PENDING  Incomplete  SARS Coronavirus 2 by RT PCR (hospital order, performed in [facility] hospital lab) Nasopharyngeal Nasopharyngeal Swab     Status: None   Collection Time: 12/05/19  5:22 PM   Specimen: Nasopharyngeal Swab  Result Value Ref Range Status   SARS Coronavirus 2 NEGATIVE NEGATIVE Final    Comment: (NOTE) SARS-CoV-2 target nucleic acids are NOT DETECTED.  The SARS-CoV-2 RNA is generally detectable in upper and lower respiratory specimens during the acute phase of infection. The lowest concentration of SARS-CoV-2 viral copies this assay can detect is 250 copies / mL. A negative result does not preclude SARS-CoV-2 infection and should not be used as the sole basis for treatment or  other patient management decisions.  A negative result may occur with improper specimen collection / handling, submission of specimen other than nasopharyngeal swab, presence of viral mutation(s) within the areas targeted by this assay, and inadequate number of viral copies (<250 copies / mL). A negative result must be combined with clinical observations, patient history, and epidemiological information.  Fact Sheet for Patients:   StrictlyIdeas.no  Fact Sheet for Healthcare Providers: BankingDealers.co.za  This test is not yet approved or  cleared by the [place] FDA and has been authorized for detection and/or diagnosis of SARS-CoV-2 by FDA under an Emergency Use Authorization (EUA).  This EUA will remain in effect (meaning this test can be used) for the duration of the COVID-19 declaration under Section 564(b)(1) of the Act, 21 U.S.C. section 360bbb-3(b)(1), unless the authorization is terminated or revoked sooner.  Performed at [facility], [place] [address] [place], [place] [address]      Time coordinating discharge: 31 minutes.   SIGNED:   Hosie Poisson, MD  [facility] 12/09/2019, 9:19 AM

## 2019-12-09 NOTE — Progress Notes (Signed)
Manufacturing engineer [facility]) Hospital Liaison Note:  Spoke with patient's daughter Tiffney Haughton) to confirm interest in hospice, explain services and offer support. Patient Daughter given [facility] contact information and encouraged to call with any concerns as needed.    Per daughter, Plan is to discharge patient back to [facility] today via ambulance with support of hospice.  TOC aware of the above.    Please send completed DNR and arrange for any comfort scripts that may be needed for symptom management so there is no lapse in patient comfort prior to hospice services beginning.   Gar Ponto, RN [facility] Liaison (in [place]) [phone]

## 2019-12-09 NOTE — Progress Notes (Signed)
Daily Progress Note   Patient Name: Lauren [name]       Date: 12/09/2019 DOB: 06/30/1924  Age: 86 y.o. MRN#: [id number] Attending Physician: Hosie Poisson, MD Primary Care Physician: Patient, No Pcp Per Admit Date: 12/05/2019  Reason for Consultation/Follow-up: Establishing goals of care  Subjective: I saw and examined Lauren [name] this morning.  She was awake and alert.  She was in a much more pleasant mood this morning and stated appreciation for me coming to visit with her.  While I do not feel she has capacity to make her own medical decisions, I did talk with her little bit more about the plan to discharge out of the hospital.  We did not discuss specifics of plan for enrollment in hospice (I do not think that she would understand this concept), but she was able to clearly tell me again today that she wanted to be out of the hospital and not come back anymore.  She also agreed that focusing on her feeling as well as possible should be our primary goal moving forward.  Length of Stay: 3  Current Medications: Scheduled Meds:  . acetaminophen  650 mg Oral Q8H   Or  . acetaminophen  650 mg Rectal Q8H  . amoxicillin-clavulanate  1 tablet Oral BID  . atorvastatin  5 mg Oral Daily  . bethanechol  10 mg Oral TID  . buPROPion  150 mg Oral Daily  . calcium-vitamin D  1 tablet Oral Daily  . enoxaparin (LOVENOX) injection  30 mg Subcutaneous Q24H  . guaiFENesin  600 mg Oral BID  . levothyroxine  88 mcg Oral Q0600  . memantine  14 mg Oral Daily  . metoprolol tartrate  50 mg Oral BID  . mirtazapine  15 mg Oral QHS  . saccharomyces boulardii  250 mg Oral BID    Continuous Infusions: . sodium chloride 50 mL/hr at 12/09/19 0230    PRN Meds: oxyCODONE  Physical Exam        \ General:  Alert, awake, in no acute distress.  Pleasantly confused this morning. HEENT: No bruits, no goiter, no JVD Heart: Regular rate and rhythm. No murmur appreciated. Lungs: Good air movement, clear Abdomen: Soft, nontender, nondistended, positive bowel sounds.  Ext: No significant edema Skin: Warm and dry   Vital Signs: BP 109/60 (  BP Location: Right Arm)   Pulse 71   Temp 99.7 F (37.6 C) (Oral)   Resp 18   Ht 5\' 8"  (1.727 m)   Wt 53.6 kg   SpO2 93%   BMI 17.97 kg/m  SpO2: SpO2: 93 % O2 Device: O2 Device: Nasal Cannula O2 Flow Rate: O2 Flow Rate (L/min): 2 L/min  Intake/output summary:   Intake/Output Summary (Last 24 hours) at 12/09/2019 7121 Last data filed at 12/09/2019 0600 Gross per 24 hour  Intake 1477.9 ml  Output 403 ml  Net 1074.9 ml   LBM: Last BM Date: 12/06/19 Baseline Weight: Weight: 53.6 kg Most recent weight: Weight: 53.6 kg       Palliative Assessment/Data:      Patient Active Problem List   Diagnosis Date Noted  . Acute respiratory failure with hypoxemia ([place]) 12/05/2019  . Acute hypoxemic respiratory failure ([place]) 12/05/2019  . HCAP (healthcare-associated pneumonia) 12/02/2019  . CAP (community acquired pneumonia) 12/01/2019  . Respiratory failure ([place]) 12/01/2019  . Hypotension 12/01/2019  . CKD (chronic kidney disease) stage 3, GFR 30-59 ml/min 09/01/2017  . Osteoporosis 10/21/2016  . Weight gain 04/16/2016  . Hypocalcemia 04/16/2016  . Hypothyroidism 04/16/2016  . Edema of right foot 04/16/2016  . Dementia ([place]) 10/31/2015  . 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 Loss of weight 03/24/2014  . Hypertension   . High cholesterol   . Anxiety     Palliative Care Assessment & Plan   Patient Profile: Lauren [name] is a 86 year old female with past medical history of dementia, hyperlipidemia, hypothyroidism, thoracic vertebral compression fracture and recent hospitalization from September 15-18 for multifocal pneumonia who returned to the  hospital following fall and hypoxemia.  Her daughter has been struggling with determine the best way to care for her mother moving forward.  Palliative consulted for goals of care.  Recommendations/Plan: Hopeful for transition back to [name] unit with hospice services provided through [facility] [employer].  I believe she is stable for transfer back today if it can be arranged.  Goals of Care and Additional Recommendations: Limitations on Scope of Treatment: Full Comfort Care  Code Status:    Code Status Orders  (From admission, onward)         Start     Ordered   12/05/19 2128  Do not attempt resuscitation (DNR)  Continuous       Question Answer Comment  In the event of cardiac or respiratory ARREST Do not call a "code blue"   In the event of cardiac or respiratory ARREST Do not perform Intubation, CPR, defibrillation or ACLS   In the event of cardiac or respiratory ARREST Use medication by any route, position, wound care, and other measures to relive pain and suffering. May use oxygen, suction and manual treatment of airway obstruction as needed for comfort.      12/05/19 2127        Code Status History    Date Active Date Inactive Code Status Order ID Comments User Context   12/02/2019 1058 12/04/2019 2238 DNR [id number]  Florencia Reasons, MD Inpatient   12/01/2019 1811 12/02/2019 1058 Full Code [id number]  Harold Hedge, MD ED   05/31/2019 1450 12/01/2019 1203 DNR [id number]  [name], NP Outpatient   Advance Care Planning Activity    Advance Directive Documentation     Most Recent Value  Type of Advance Directive Out of facility DNR (pink MOST or yellow form), Healthcare Power of Attorney  Pre-existing out of facility  DNR order (yellow form or pink MOST form) --  "MOST" Form in Place? --      Prognosis:  Patient has had significant changes in her functional status and now has been readmitted to the hospital twice this month with multifocal pneumonia.   While her overall fast score would not be advanced enough to be indicative of hospice eligibility by itself, I believe her recurrent hospitalizations and recurrent multifocal pneumonia portend her to have a prognosis of less than 6 months if her disease follows its natural course.  Discharge Planning: Memory care unit with hospice support  Care plan was discussed with patient  Thank you for allowing the Palliative Medicine Team to assist in the care of this patient.   Time In: 0850 Time Out: 0910 Total Time 20 Prolonged Time Billed No      Greater than 50%  of this time was spent counseling and coordinating care related to the above assessment and plan.  Micheline Rough, MD  Please contact Palliative Medicine Team phone at [phone] for questions and concerns.

## 2019-12-11 LAB — CULTURE, BLOOD (ROUTINE X 2)
Culture: NO GROWTH
Culture: NO GROWTH
Special Requests: ADEQUATE
Special Requests: ADEQUATE

## 2020-04-13 ENCOUNTER — Non-Acute Institutional Stay: Payer: Medicare HMO | Admitting: Nurse Practitioner

## 2020-04-13 ENCOUNTER — Other Ambulatory Visit: Payer: Self-pay

## 2020-04-13 DIAGNOSIS — Z515 Encounter for palliative care: Secondary | ICD-10-CM

## 2020-04-13 DIAGNOSIS — F0150 Vascular dementia without behavioral disturbance: Secondary | ICD-10-CM

## 2020-04-13 NOTE — Progress Notes (Signed)
Lauren [name] Note Telephone: [phone]  Fax: [phone]  PATIENT NAME: Lauren [name] [address] [place] [phone] (home)  DOB: 1924-06-08 MRN: [id number]  PRIMARY CARE PROVIDER:    Welford Roche, MD  REFERRING PROVIDER:   Welford Roche, MD  RESPONSIBLE PARTY:   Extended Emergency Contact Information Primary Emergency Contact: Lauren [name] Address: [place] [address] Lauren [name] Phone: [phone] Relation: Daughter  I met face to face with patient in facility.  ASSESSMENT AND RECOMMENDATIONS:   Advance Care Planning: Unable to reach patient's daughter to review ACP and goal of care. Will attempt to reach again. Goal of care: Patient report her goal is to be comfortable. Directives: Signed DNR and MOST form on file in the facility, copy uploaded to [facility] EMR. Details of MOST form include: DNR/no CPR, comfort measures, determine use or limitation when infection occurs, no IV fluids, no feeding tube.  Symptom Management:  Dementia: Patient not cooperative with interview, insisting that I remove my face mask saying she doses not speak with person who she can not see their face. Patient made aware that the face mask is to protect patient and myself from the Covid-19 virus, patient insisted on removal of face mask. Staff report patient would occasionally refuse care, staff report patient with progressive decline in function and cognition. She is however still able to assist in completing her ADLs. No report fever, chills, SOB, or decline in appetite. Patient on Mirtazepam 26m and Ensure nutritional supplement twice a day. Patient noted with slight increase in weight this month.  Recommendation: Continue current plan of care. Encourage meal choices from a variety of food groups, encourage snacking between meals. Discussion on disease trajectory of  dementia with staff, as it is progressive and terminal and likely to eventually lead to dysphagia, weight loss and immobility. Provided general support and encouragement, no other unmet needs identified at this time.  Follow up Palliative Care Visit: Palliative care will continue to follow for goals of care clarification and symptom management. Return in about 6-8 weeks or prn.  Family /Caregiver/Community Supports: Patient is a resident in Memory care unit if [employer] [facility]   Cognitive / Functional decline: Patient awake and alert, communicative with mild confusion. Patient requires assistance for bathing but able to dress self, She ambulates with a Rolator walker. Staff report progressive decline in function and cognition. Last hospitalization was September 2021.  I spent 48 minutes providing this consultation, time includes time spent with patient, chart review, staff interview, and documentation. More than 50% of the time in this consultation was spent counseling and coordinating communication.   CHIEF COMPLAINT: Initial Palliative care [name]  History obtained from review of EMR, discussion with primary team, and interview with facility staff. Records reviewed and summarized bellow.  HISTORY OF PRESENT ILLNESS:  Lauren DOHNERis a 87y.o. year old female with multiple medical problems including Vascular Dementia (FAST 6b ), CKD 4, Osteoporosis, HTN, HLD, thyroid disease. Palliative Care was asked to follow this patient by consultation request of No ref. provider found to help address advance care planning and goals of care.   CODE STATUS: DNR  PPS: 50%  HOSPICE ELIGIBILITY/DIAGNOSIS: TBD  PHYSICAL EXAM / ROS:  Current and past weights: 117lbs up from 116lbs last month, Ht 51f", BMI 18.9kg/m2 General: frail appearing, thin, sitting in chair in common area  in NAD, not cooperative with interview Cardiovascular: no chest pain reported, no edema  Pulmonary: no cough, no SOB,  room air GI: appetite fair, no report of constipation, continent of bowel GU: denies dysuria, incontinent of urine MSK:  no joint and ROM abnormalities, ambulatory Skin: no rashes or wounds reported Neurological: Weakness, confused, but otherwise nonfocal Psych: non -anxious affect  PAST MEDICAL HISTORY:  Past Medical History:  Diagnosis Date  . Anxiety   . Cancer ([place]) 12/01/2018   skin cancer on her head(L)  . High cholesterol   . Hypertension   . Osteoporosis   . Rosacea   . Thyroid disease   . Weight loss     SOCIAL HX:  Social History   Tobacco Use  . Smoking status: Never Smoker  . Smokeless tobacco: Never Used  Substance Use Topics  . Alcohol use: No    Alcohol/week: 0.0 standard drinks   FAMILY HX:  Family History  Problem Relation Age of Onset  . Hypertension Mother   . Stroke Mother   . Stroke Father   . Lung cancer Daughter     ALLERGIES:  Allergies  Allergen Reactions  . Aricept [Donepezil Hcl] Other (See Comments)    Unknown reaction  . Aspirin Other (See Comments)    Upset stomach      PERTINENT MEDICATIONS:  Outpatient Encounter Medications as of 04/13/2020  Medication Sig  . atorvastatin (LIPITOR) 10 MG tablet Take 0.5 tablets (5 mg total) by mouth daily.  . bethanechol (URECHOLINE) 10 MG tablet Take 1 tablet (10 mg total) by mouth 3 (three) times daily.  Marland Kitchen buPROPion (WELLBUTRIN XL) 150 MG 24 hr tablet TAKE 1 TABLET DAILY IN THE MORNING FOR ANXIETY (Patient taking differently: Take 150 mg by mouth daily. )  . Calcium Carbonate-Vitamin D3 (CALCIUM + VITAMIN D3) 600-400 MG-UNIT TABS Take 1 tablet by mouth daily.  Marland Kitchen guaiFENesin (MUCINEX) 600 MG 12 hr tablet Take 1 tablet (600 mg total) by mouth 2 (two) times daily.  Marland Kitchen levothyroxine (SYNTHROID) 88 MCG tablet Take 88 mcg by mouth daily.  . memantine (NAMENDA XR) 14 MG CP24 24 hr capsule TAKE 1 CAPSULE DAILY FOR MEMORY (Patient taking differently: Take 14 mg by mouth daily. )  . metoprolol tartrate  (LOPRESSOR) 50 MG tablet TAKE 1 TABLET TWICE A DAY (Patient taking differently: Take 50 mg by mouth 2 (two) times daily. )  . mirtazapine (REMERON) 15 MG tablet Take 1 tablet (15 mg total) by mouth at bedtime.  . Nutritional Supplements (ENSURE PO) Take 1 Can by mouth in the morning and at bedtime.    No facility-administered encounter medications on file as of 04/13/2020.    Thank you for the opportunity to participate in the care of Lauren [name]. The palliative care team will continue to follow. Please call our office at [phone] if we can be of additional assistance.  Jari Favre, DNP, AGPCNP-BC

## 2020-05-02 ENCOUNTER — Other Ambulatory Visit: Payer: Self-pay

## 2020-05-02 ENCOUNTER — Encounter (HOSPITAL_COMMUNITY): Payer: Self-pay

## 2020-05-02 ENCOUNTER — Inpatient Hospital Stay (HOSPITAL_COMMUNITY)
Admission: EM | Admit: 2020-05-02 | Discharge: 2020-05-11 | DRG: 521 | Disposition: A | Discharge status: Skilled Nursing Facility | Payer: Medicare HMO | Source: Skilled Nursing Facility | Attending: Internal Medicine | Admitting: Internal Medicine

## 2020-05-02 ENCOUNTER — Emergency Department (HOSPITAL_COMMUNITY): Payer: Medicare HMO

## 2020-05-02 DIAGNOSIS — S0990XA Unspecified injury of head, initial encounter: Secondary | ICD-10-CM | POA: Diagnosis present

## 2020-05-02 DIAGNOSIS — Z85828 Personal history of other malignant neoplasm of skin: Secondary | ICD-10-CM

## 2020-05-02 DIAGNOSIS — G9340 Encephalopathy, unspecified: Secondary | ICD-10-CM | POA: Diagnosis present

## 2020-05-02 DIAGNOSIS — D649 Anemia, unspecified: Secondary | ICD-10-CM | POA: Diagnosis present

## 2020-05-02 DIAGNOSIS — E890 Postprocedural hypothyroidism: Secondary | ICD-10-CM | POA: Diagnosis present

## 2020-05-02 DIAGNOSIS — E039 Hypothyroidism, unspecified: Secondary | ICD-10-CM | POA: Diagnosis present

## 2020-05-02 DIAGNOSIS — J189 Pneumonia, unspecified organism: Secondary | ICD-10-CM | POA: Diagnosis present

## 2020-05-02 DIAGNOSIS — I129 Hypertensive chronic kidney disease with stage 1 through stage 4 chronic kidney disease, or unspecified chronic kidney disease: Secondary | ICD-10-CM | POA: Diagnosis present

## 2020-05-02 DIAGNOSIS — S72001A Fracture of unspecified part of neck of right femur, initial encounter for closed fracture: Secondary | ICD-10-CM | POA: Diagnosis not present

## 2020-05-02 DIAGNOSIS — E782 Mixed hyperlipidemia: Secondary | ICD-10-CM | POA: Diagnosis present

## 2020-05-02 DIAGNOSIS — E440 Moderate protein-calorie malnutrition: Secondary | ICD-10-CM | POA: Diagnosis present

## 2020-05-02 DIAGNOSIS — I10 Essential (primary) hypertension: Secondary | ICD-10-CM

## 2020-05-02 DIAGNOSIS — J9601 Acute respiratory failure with hypoxia: Secondary | ICD-10-CM | POA: Diagnosis present

## 2020-05-02 DIAGNOSIS — F0390 Unspecified dementia without behavioral disturbance: Secondary | ICD-10-CM | POA: Diagnosis present

## 2020-05-02 DIAGNOSIS — Y92003 Bedroom of unspecified non-institutional (private) residence as the place of occurrence of the external cause: Secondary | ICD-10-CM

## 2020-05-02 DIAGNOSIS — Z8249 Family history of ischemic heart disease and other diseases of the circulatory system: Secondary | ICD-10-CM

## 2020-05-02 DIAGNOSIS — W06XXXA Fall from bed, initial encounter: Secondary | ICD-10-CM | POA: Diagnosis present

## 2020-05-02 DIAGNOSIS — Z886 Allergy status to analgesic agent status: Secondary | ICD-10-CM

## 2020-05-02 DIAGNOSIS — Z66 Do not resuscitate: Secondary | ICD-10-CM | POA: Diagnosis present

## 2020-05-02 DIAGNOSIS — Z96649 Presence of unspecified artificial hip joint: Secondary | ICD-10-CM

## 2020-05-02 DIAGNOSIS — Z79890 Hormone replacement therapy (postmenopausal): Secondary | ICD-10-CM

## 2020-05-02 DIAGNOSIS — Z681 Body mass index (BMI) 19 or less, adult: Secondary | ICD-10-CM

## 2020-05-02 DIAGNOSIS — N1831 Chronic kidney disease, stage 3a: Secondary | ICD-10-CM

## 2020-05-02 DIAGNOSIS — Z79899 Other long term (current) drug therapy: Secondary | ICD-10-CM

## 2020-05-02 DIAGNOSIS — Z515 Encounter for palliative care: Secondary | ICD-10-CM

## 2020-05-02 DIAGNOSIS — M810 Age-related osteoporosis without current pathological fracture: Secondary | ICD-10-CM | POA: Diagnosis present

## 2020-05-02 DIAGNOSIS — Z9049 Acquired absence of other specified parts of digestive tract: Secondary | ICD-10-CM

## 2020-05-02 DIAGNOSIS — Z20822 Contact with and (suspected) exposure to covid-19: Secondary | ICD-10-CM | POA: Diagnosis present

## 2020-05-02 DIAGNOSIS — F419 Anxiety disorder, unspecified: Secondary | ICD-10-CM | POA: Diagnosis present

## 2020-05-02 DIAGNOSIS — Z888 Allergy status to other drugs, medicaments and biological substances status: Secondary | ICD-10-CM

## 2020-05-02 LAB — COMPREHENSIVE METABOLIC PANEL
ALT: 32 U/L (ref 0–44)
AST: 36 U/L (ref 15–41)
Albumin: 3.9 g/dL (ref 3.5–5.0)
Alkaline Phosphatase: 101 U/L (ref 38–126)
Anion gap: 10 (ref 5–15)
BUN: 31 mg/dL — ABNORMAL HIGH (ref 8–23)
CO2: 28 mmol/L (ref 22–32)
Calcium: 7.7 mg/dL — ABNORMAL LOW (ref 8.9–10.3)
Chloride: 104 mmol/L (ref 98–111)
Creatinine, Ser: 1.19 mg/dL — ABNORMAL HIGH (ref 0.44–1.00)
GFR, Estimated: 42 mL/min — ABNORMAL LOW (ref >60)
Glucose, Bld: 165 mg/dL — ABNORMAL HIGH (ref 70–99)
Potassium: 4.3 mmol/L (ref 3.5–5.1)
Sodium: 142 mmol/L (ref 135–145)
Total Bilirubin: 0.7 mg/dL (ref 0.3–1.2)
Total Protein: 6.8 g/dL (ref 6.5–8.1)

## 2020-05-02 LAB — CBC WITH DIFFERENTIAL/PLATELET
Abs Immature Granulocytes: 0.11 10*3/uL — ABNORMAL HIGH (ref 0.00–0.07)
Basophils Absolute: 0 10*3/uL (ref 0.0–0.1)
Basophils Relative: 0 %
Eosinophils Absolute: 0 10*3/uL (ref 0.0–0.5)
Eosinophils Relative: 0 %
HCT: 41.8 % (ref 36.0–46.0)
Hemoglobin: 13.1 g/dL (ref 12.0–15.0)
Immature Granulocytes: 1 %
Lymphocytes Relative: 10 %
Lymphs Abs: 1.1 10*3/uL (ref 0.7–4.0)
MCH: 30.6 pg (ref 26.0–34.0)
MCHC: 31.3 g/dL (ref 30.0–36.0)
MCV: 97.7 fL (ref 80.0–100.0)
Monocytes Absolute: 0.7 10*3/uL (ref 0.1–1.0)
Monocytes Relative: 7 %
Neutro Abs: 8.7 10*3/uL — ABNORMAL HIGH (ref 1.7–7.7)
Neutrophils Relative %: 82 %
Platelets: 189 10*3/uL (ref 150–400)
RBC: 4.28 MIL/uL (ref 3.87–5.11)
RDW: 14.8 % (ref 11.5–15.5)
WBC: 10.6 10*3/uL — ABNORMAL HIGH (ref 4.0–10.5)
nRBC: 0 % (ref 0.0–0.2)

## 2020-05-02 LAB — PROTIME-INR
INR: 1.1 (ref 0.8–1.2)
Prothrombin Time: 13.8 seconds (ref 11.4–15.2)

## 2020-05-02 MED ORDER — FENTANYL CITRATE (PF) 100 MCG/2ML IJ SOLN
50.0000 ug | Freq: Once | INTRAMUSCULAR | Status: AC
  Administered 2020-05-02: 50 ug via INTRAVENOUS
  Filled 2020-05-02: qty 2

## 2020-05-02 MED ORDER — SODIUM CHLORIDE 0.9 % IV SOLN
1.0000 g | Freq: Once | INTRAVENOUS | Status: AC
  Administered 2020-05-02: 1 g via INTRAVENOUS
  Filled 2020-05-02: qty 10

## 2020-05-02 MED ORDER — FENTANYL CITRATE (PF) 100 MCG/2ML IJ SOLN
25.0000 ug | Freq: Once | INTRAMUSCULAR | Status: AC
  Administered 2020-05-02: 25 ug via INTRAVENOUS
  Filled 2020-05-02: qty 2

## 2020-05-02 MED ORDER — DOXYCYCLINE HYCLATE 100 MG PO TABS
100.0000 mg | ORAL_TABLET | Freq: Once | ORAL | Status: AC
  Administered 2020-05-02: 100 mg via ORAL
  Filled 2020-05-02: qty 1

## 2020-05-02 NOTE — ED Triage Notes (Signed)
Patient arrived via [place] from [facility]. Pt had a fall out of bed when taking her meds this evening. Pt does not take any blood thinners. Pt has a hx of dementia and HTN. Pt did not hit her head or have LOC. Pt complains of some pain in her right leg with movement.   BP: 205/102 O2: 93% RA  Pt usually on 2L O2 as needed.

## 2020-05-02 NOTE — ED Provider Notes (Signed)
[place] [place] DEPT Provider Note   CSN: [id number] Arrival date & time: 05/02/20  2025     History No chief complaint on file.   Lauren [name] is a 87 y.o. female.  The history is provided by the patient and medical records.   Lauren [name] is a 87 y.o. female who presents to the Emergency Department complaining of fall. Level V caveat due to dementia. History is provided by EMS and the patient. Patient states that she was walking in her leg gave out. According to EMS that she fell out of bed when she was taking her medications this evening. They reports of head injury. She complains of pain to the right hip and leg. No loss of consciousness. [facility] and is on oxygen as needed. She is fully vaccinated and boosted for COVID-19  Daughter, Tonianne Fine [phone]    Past Medical History:  Diagnosis Date  . Anxiety   . Cancer ([place]) 12/01/2018   skin cancer on her head(L)  . High cholesterol   . Hypertension   . Osteoporosis   . Rosacea   . Thyroid disease   . Weight loss     Patient Active Problem List   Diagnosis Date Noted  . Acute respiratory failure with hypoxemia ([place]) 12/05/2019  . Acute hypoxemic respiratory failure ([place]) 12/05/2019  . HCAP (healthcare-associated pneumonia) 12/02/2019  . CAP (community acquired pneumonia) 12/01/2019  . Respiratory failure ([place]) 12/01/2019  . Hypotension 12/01/2019  . CKD (chronic kidney disease) stage 3, GFR 30-59 ml/min 09/01/2017  . Osteoporosis 10/21/2016  . Weight gain 04/16/2016  . Hypocalcemia 04/16/2016  . Hypothyroidism 04/16/2016  . Edema of right foot 04/16/2016  . Dementia ([place]) 10/31/2015  . 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 Loss of weight 03/24/2014  . Hypertension   . High cholesterol   . Anxiety     Past Surgical History:  Procedure Laterality Date  . CHOLECYSTECTOMY  1959   [place]  . THYROIDECTOMY  2003   [place]  .  TONSILLECTOMY AND ADENOIDECTOMY       OB History   No obstetric history on file.     Family History  Problem Relation Age of Onset  . Hypertension Mother   . Stroke Mother   . Stroke Father   . Lung cancer Daughter     Social History   Tobacco Use  . Smoking status: Never Smoker  . Smokeless tobacco: Never Used  Vaping Use  . Vaping Use: Never used  Substance Use Topics  . Alcohol use: No    Alcohol/week: 0.0 standard drinks  . Drug use: No    Home Medications Prior to Admission medications   Medication Sig Start Date End Date Taking? Authorizing Provider  acetaminophen (TYLENOL) 500 MG tablet Take 500 mg by mouth every 8 (eight) hours as needed for moderate pain.   Yes [provider]  atorvastatin (LIPITOR) 10 MG tablet Take 0.5 tablets (5 mg total) by mouth daily. 01/19/19  Yes [name], NP  buPROPion (WELLBUTRIN XL) 150 MG 24 hr tablet TAKE 1 TABLET DAILY IN THE MORNING FOR ANXIETY Patient taking differently: Take 150 mg by mouth daily. 06/03/19  Yes [name], NP  levothyroxine (SYNTHROID) 100 MCG tablet Take 100 mcg by mouth daily before breakfast.   Yes [provider]  memantine (NAMENDA XR) 14 MG CP24 24 hr capsule TAKE 1 CAPSULE DAILY FOR MEMORY Patient taking differently: Take 14 mg by  mouth daily. 01/18/19  Yes [name], NP  metoprolol tartrate (LOPRESSOR) 50 MG tablet TAKE 1 TABLET TWICE A DAY Patient taking differently: Take 50 mg by mouth 2 (two) times daily. 09/22/18  Yes [name], NP  mirtazapine (REMERON) 7.5 MG tablet Take 7.5 mg by mouth at bedtime.   Yes [provider]  Nutritional Supplements (ENSURE PO) Take 1 Can by mouth in the morning and at bedtime.    Yes [provider]  OXYGEN Inhale 2 L into the lungs daily as needed (patient comfort).   Yes [provider]  bethanechol (URECHOLINE) 10 MG tablet Take 1 tablet (10 mg total) by mouth 3 (three) times daily. Patient  not taking: Reported on 05/02/2020 12/09/19   Hosie Poisson, MD  guaiFENesin (MUCINEX) 600 MG 12 hr tablet Take 1 tablet (600 mg total) by mouth 2 (two) times daily. Patient not taking: Reported on 05/02/2020 12/04/19   Florencia Reasons, MD  mirtazapine (REMERON) 15 MG tablet Take 1 tablet (15 mg total) by mouth at bedtime. Patient not taking: Reported on 05/02/2020 12/04/19 01/03/20  Florencia Reasons, MD    Allergies    Aricept Reather [place] hcl] and Aspirin  Review of Systems   Review of Systems  All other systems reviewed and are negative.   Physical Exam Updated Vital Signs BP (!) 189/103   Pulse 73   Temp 98 F (36.7 C) (Oral)   Resp 14   SpO2 94%   Physical Exam Vitals and nursing note reviewed.  Constitutional:      Appearance: She is well-developed and well-nourished.  HENT:     Head: Normocephalic and atraumatic.  Cardiovascular:     Rate and Rhythm: Normal rate and regular rhythm.     Heart sounds: No murmur heard.   Pulmonary:     Effort: Pulmonary effort is normal. No respiratory distress.     Breath sounds: Normal breath sounds.  Abdominal:     Palpations: Abdomen is soft.     Tenderness: There is no abdominal tenderness. There is no guarding or rebound.  Musculoskeletal:        General: Tenderness present. No edema.     Comments: 2+ femoral and DP pulses bilaterally. There is tenderness to palpation over the right hip. Unable to range the right hip secondary to pain.  Skin:    General: Skin is warm and dry.  Neurological:     Mental Status: She is alert.     Comments: Oriented to person in place. Disoriented to time in recent events.  Psychiatric:        Mood and Affect: Mood and affect normal.        Behavior: Behavior normal.     ED Results / Procedures / Treatments   Labs (all labs ordered are listed, but only abnormal results are displayed) Labs Reviewed  CBC WITH DIFFERENTIAL/PLATELET - Abnormal; Notable for the following components:      Result Value   WBC 10.6  (*)    Neutro Abs 8.7 (*)    Abs Immature Granulocytes 0.11 (*)    All other components within normal limits  SARS CORONAVIRUS 2 (TAT 6-24 HRS)  PROTIME-INR  COMPREHENSIVE METABOLIC PANEL  URINALYSIS, ROUTINE W REFLEX MICROSCOPIC    EKG None  Radiology DG Chest 1 View  Result Date: 05/02/2020 CLINICAL DATA:  Fall EXAM: CHEST  1 VIEW COMPARISON:  12/05/2019 FINDINGS: Patchy focus of airspace disease in the right mid lung. Suspected consolidation left base with  possible small effusion. Stable cardiomediastinal silhouette. No pneumothorax IMPRESSION: Patchy focus of airspace disease in the right mid lung with suspected patchy consolidation left base, possible pneumonia. Probable small left effusion. Electronically Signed   By: Donavan Foil M.D.   On: 05/02/2020 22:13   DG Hip Unilat W or Wo Pelvis 2-3 Views Right  Result Date: 05/02/2020 CLINICAL DATA:  Fall EXAM: DG HIP (WITH OR WITHOUT PELVIS) 2-3V RIGHT COMPARISON:  None. FINDINGS: SI joints are non widened. Pubic symphysis appears intact. Acute nondisplaced right femoral neck fracture. No femoral head dislocation. IMPRESSION: Acute nondisplaced right femoral neck fracture. Electronically Signed   By: Donavan Foil M.D.   On: 05/02/2020 22:12    Procedures Procedures   Medications Ordered in ED Medications  cefTRIAXone (ROCEPHIN) 1 g in sodium chloride 0.9 % 100 mL IVPB (has no administration in time range)  doxycycline (VIBRA-TABS) tablet 100 mg (has no administration in time range)  fentaNYL (SUBLIMAZE) injection 25 mcg (25 mcg Intravenous Given 05/02/20 2132)  fentaNYL (SUBLIMAZE) injection 50 mcg (50 mcg Intravenous Given 05/02/20 2241)    ED Course  I have reviewed the triage vital signs and the nursing notes.  Pertinent labs & imaging results that were available during my care of the patient were reviewed by me and considered in my medical decision making (see chart for details).    MDM Rules/Calculators/A&P                          patient here for evaluation of injuries following a fall earlier today. She complains of pain to her right hip. Imaging is significant for nondisplaced right femoral neck fracture. Discussed with daughter findings of studies and recommendation for admission. Discussed the patient with Dr.Olin with Orthopedics, who will see the patient and consult. Hospitalist consulted for admission. Chest x-ray with possible infiltrate, she was treated with antibiotics for possible community acquired pneumonia although she seems relatively asymptomatic at this time.  Final Clinical Impression(s) / ED Diagnoses Final diagnoses:  Closed fracture of neck of right femur, initial encounter [facility])    Rx / [place] Orders ED Discharge Orders    None       Quintella Reichert, MD 05/03/20 0020

## 2020-05-03 ENCOUNTER — Other Ambulatory Visit: Payer: Self-pay

## 2020-05-03 ENCOUNTER — Encounter (HOSPITAL_COMMUNITY): Payer: Self-pay | Admitting: Internal Medicine

## 2020-05-03 ENCOUNTER — Observation Stay (HOSPITAL_COMMUNITY): Payer: Medicare HMO

## 2020-05-03 DIAGNOSIS — Z888 Allergy status to other drugs, medicaments and biological substances status: Secondary | ICD-10-CM | POA: Diagnosis not present

## 2020-05-03 DIAGNOSIS — F0390 Unspecified dementia without behavioral disturbance: Secondary | ICD-10-CM | POA: Diagnosis present

## 2020-05-03 DIAGNOSIS — I10 Essential (primary) hypertension: Secondary | ICD-10-CM | POA: Diagnosis not present

## 2020-05-03 DIAGNOSIS — Z66 Do not resuscitate: Secondary | ICD-10-CM | POA: Diagnosis present

## 2020-05-03 DIAGNOSIS — S0990XA Unspecified injury of head, initial encounter: Secondary | ICD-10-CM | POA: Diagnosis present

## 2020-05-03 DIAGNOSIS — D649 Anemia, unspecified: Secondary | ICD-10-CM | POA: Diagnosis present

## 2020-05-03 DIAGNOSIS — J189 Pneumonia, unspecified organism: Secondary | ICD-10-CM

## 2020-05-03 DIAGNOSIS — Y92003 Bedroom of unspecified non-institutional (private) residence as the place of occurrence of the external cause: Secondary | ICD-10-CM | POA: Diagnosis not present

## 2020-05-03 DIAGNOSIS — J9601 Acute respiratory failure with hypoxia: Secondary | ICD-10-CM | POA: Diagnosis present

## 2020-05-03 DIAGNOSIS — Z681 Body mass index (BMI) 19 or less, adult: Secondary | ICD-10-CM | POA: Diagnosis not present

## 2020-05-03 DIAGNOSIS — Z85828 Personal history of other malignant neoplasm of skin: Secondary | ICD-10-CM | POA: Diagnosis not present

## 2020-05-03 DIAGNOSIS — M810 Age-related osteoporosis without current pathological fracture: Secondary | ICD-10-CM | POA: Diagnosis present

## 2020-05-03 DIAGNOSIS — E782 Mixed hyperlipidemia: Secondary | ICD-10-CM | POA: Diagnosis present

## 2020-05-03 DIAGNOSIS — G9340 Encephalopathy, unspecified: Secondary | ICD-10-CM | POA: Diagnosis present

## 2020-05-03 DIAGNOSIS — Z79899 Other long term (current) drug therapy: Secondary | ICD-10-CM | POA: Diagnosis not present

## 2020-05-03 DIAGNOSIS — W06XXXA Fall from bed, initial encounter: Secondary | ICD-10-CM | POA: Diagnosis present

## 2020-05-03 DIAGNOSIS — E039 Hypothyroidism, unspecified: Secondary | ICD-10-CM

## 2020-05-03 DIAGNOSIS — Z9049 Acquired absence of other specified parts of digestive tract: Secondary | ICD-10-CM | POA: Diagnosis not present

## 2020-05-03 DIAGNOSIS — N1831 Chronic kidney disease, stage 3a: Secondary | ICD-10-CM | POA: Diagnosis present

## 2020-05-03 DIAGNOSIS — F419 Anxiety disorder, unspecified: Secondary | ICD-10-CM | POA: Diagnosis present

## 2020-05-03 DIAGNOSIS — E440 Moderate protein-calorie malnutrition: Secondary | ICD-10-CM | POA: Diagnosis present

## 2020-05-03 DIAGNOSIS — E890 Postprocedural hypothyroidism: Secondary | ICD-10-CM | POA: Diagnosis present

## 2020-05-03 DIAGNOSIS — Z515 Encounter for palliative care: Secondary | ICD-10-CM | POA: Diagnosis not present

## 2020-05-03 DIAGNOSIS — Z20822 Contact with and (suspected) exposure to covid-19: Secondary | ICD-10-CM | POA: Diagnosis present

## 2020-05-03 DIAGNOSIS — I129 Hypertensive chronic kidney disease with stage 1 through stage 4 chronic kidney disease, or unspecified chronic kidney disease: Secondary | ICD-10-CM | POA: Diagnosis present

## 2020-05-03 DIAGNOSIS — I5030 Unspecified diastolic (congestive) heart failure: Secondary | ICD-10-CM | POA: Diagnosis not present

## 2020-05-03 DIAGNOSIS — S72001A Fracture of unspecified part of neck of right femur, initial encounter for closed fracture: Secondary | ICD-10-CM | POA: Diagnosis present

## 2020-05-03 DIAGNOSIS — Z79890 Hormone replacement therapy (postmenopausal): Secondary | ICD-10-CM | POA: Diagnosis not present

## 2020-05-03 DIAGNOSIS — Z8249 Family history of ischemic heart disease and other diseases of the circulatory system: Secondary | ICD-10-CM | POA: Diagnosis not present

## 2020-05-03 LAB — COMPREHENSIVE METABOLIC PANEL
ALT: 35 U/L (ref 0–44)
AST: 41 U/L (ref 15–41)
Albumin: 3.8 g/dL (ref 3.5–5.0)
Alkaline Phosphatase: 107 U/L (ref 38–126)
Anion gap: 14 (ref 5–15)
BUN: 29 mg/dL — ABNORMAL HIGH (ref 8–23)
CO2: 22 mmol/L (ref 22–32)
Calcium: 7.8 mg/dL — ABNORMAL LOW (ref 8.9–10.3)
Chloride: 104 mmol/L (ref 98–111)
Creatinine, Ser: 1.04 mg/dL — ABNORMAL HIGH (ref 0.44–1.00)
GFR, Estimated: 50 mL/min — ABNORMAL LOW (ref >60)
Glucose, Bld: 156 mg/dL — ABNORMAL HIGH (ref 70–99)
Potassium: 4.2 mmol/L (ref 3.5–5.1)
Sodium: 140 mmol/L (ref 135–145)
Total Bilirubin: 1 mg/dL (ref 0.3–1.2)
Total Protein: 7 g/dL (ref 6.5–8.1)

## 2020-05-03 LAB — URINALYSIS, ROUTINE W REFLEX MICROSCOPIC
Bacteria, UA: NONE SEEN
Bilirubin Urine: NEGATIVE
Glucose, UA: NEGATIVE mg/dL
Hgb urine dipstick: NEGATIVE
Ketones, ur: NEGATIVE mg/dL
Leukocytes,Ua: NEGATIVE
Nitrite: NEGATIVE
Protein, ur: 30 mg/dL — AB
Specific Gravity, Urine: 1.014 (ref 1.005–1.030)
pH: 6 (ref 5.0–8.0)

## 2020-05-03 LAB — CBC WITH DIFFERENTIAL/PLATELET
Abs Immature Granulocytes: 0.11 10*3/uL — ABNORMAL HIGH (ref 0.00–0.07)
Basophils Absolute: 0 10*3/uL (ref 0.0–0.1)
Basophils Relative: 0 %
Eosinophils Absolute: 0.1 10*3/uL (ref 0.0–0.5)
Eosinophils Relative: 0 %
HCT: 43.5 % (ref 36.0–46.0)
Hemoglobin: 13.7 g/dL (ref 12.0–15.0)
Immature Granulocytes: 1 %
Lymphocytes Relative: 5 %
Lymphs Abs: 0.8 10*3/uL (ref 0.7–4.0)
MCH: 30.9 pg (ref 26.0–34.0)
MCHC: 31.5 g/dL (ref 30.0–36.0)
MCV: 98.2 fL (ref 80.0–100.0)
Monocytes Absolute: 0.9 10*3/uL (ref 0.1–1.0)
Monocytes Relative: 6 %
Neutro Abs: 14 10*3/uL — ABNORMAL HIGH (ref 1.7–7.7)
Neutrophils Relative %: 88 %
Platelets: 170 10*3/uL (ref 150–400)
RBC: 4.43 MIL/uL (ref 3.87–5.11)
RDW: 14.9 % (ref 11.5–15.5)
WBC: 16 10*3/uL — ABNORMAL HIGH (ref 4.0–10.5)
nRBC: 0 % (ref 0.0–0.2)

## 2020-05-03 LAB — PROCALCITONIN: Procalcitonin: <0.1 ng/mL

## 2020-05-03 LAB — RESP PANEL BY RT-PCR (FLU A&B, COVID) ARPGX2
Influenza A by PCR: NEGATIVE
Influenza B by PCR: NEGATIVE
SARS Coronavirus 2 by RT PCR: NEGATIVE

## 2020-05-03 LAB — HIV ANTIBODY (ROUTINE TESTING W REFLEX): HIV Screen 4th Generation wRfx: NONREACTIVE

## 2020-05-03 LAB — C-REACTIVE PROTEIN: CRP: 2.3 mg/dL — ABNORMAL HIGH (ref <1.0)

## 2020-05-03 LAB — BRAIN NATRIURETIC PEPTIDE: B Natriuretic Peptide: 753.5 pg/mL — ABNORMAL HIGH (ref 0.0–100.0)

## 2020-05-03 LAB — SARS CORONAVIRUS 2 (TAT 6-24 HRS): SARS Coronavirus 2: NEGATIVE

## 2020-05-03 MED ORDER — MORPHINE SULFATE (PF) 2 MG/ML IV SOLN
2.0000 mg | INTRAVENOUS | Status: DC | PRN
  Filled 2020-05-03: qty 1

## 2020-05-03 MED ORDER — LACTATED RINGERS IV SOLN: INTRAVENOUS | Status: DC

## 2020-05-03 MED ORDER — ENOXAPARIN SODIUM 30 MG/0.3ML ~~LOC~~ SOLN
30.0000 mg | Freq: Once | SUBCUTANEOUS | Status: AC
  Administered 2020-05-03: 30 mg via SUBCUTANEOUS
  Filled 2020-05-03: qty 0.3

## 2020-05-03 MED ORDER — CHLORHEXIDINE GLUCONATE 0.12 % MT SOLN
15.0000 mL | Freq: Two times a day (BID) | OROMUCOSAL | Status: DC
  Administered 2020-05-03 – 2020-05-11 (×12): 15 mL via OROMUCOSAL
  Filled 2020-05-03 (×15): qty 15

## 2020-05-03 MED ORDER — HYDROMORPHONE HCL 1 MG/ML IJ SOLN
0.5000 mg | Freq: Once | INTRAMUSCULAR | Status: DC
Start: 2020-05-03 — End: 2020-05-03

## 2020-05-03 MED ORDER — ATORVASTATIN CALCIUM 10 MG PO TABS
5.0000 mg | ORAL_TABLET | Freq: Every day | ORAL | Status: DC
  Administered 2020-05-03 – 2020-05-11 (×9): 5 mg via ORAL
  Filled 2020-05-03 (×9): qty 1

## 2020-05-03 MED ORDER — SODIUM CHLORIDE 0.9 % IV SOLN
100.0000 mg | Freq: Two times a day (BID) | INTRAVENOUS | Status: DC
  Filled 2020-05-03: qty 100

## 2020-05-03 MED ORDER — POLYETHYLENE GLYCOL 3350 17 G PO PACK: 17.0000 g | PACK | Freq: Every day | ORAL | Status: DC | PRN

## 2020-05-03 MED ORDER — SODIUM CHLORIDE 0.9 % IV SOLN: 2.0000 g | INTRAVENOUS | Status: DC

## 2020-05-03 MED ORDER — ORAL CARE MOUTH RINSE
15.0000 mL | Freq: Two times a day (BID) | OROMUCOSAL | Status: DC
  Administered 2020-05-03 – 2020-05-11 (×12): 15 mL via OROMUCOSAL

## 2020-05-03 MED ORDER — HYDRALAZINE HCL 20 MG/ML IJ SOLN
10.0000 mg | INTRAMUSCULAR | Status: DC | PRN
  Administered 2020-05-03: 10 mg via INTRAVENOUS
  Filled 2020-05-03: qty 1

## 2020-05-03 MED ORDER — METOPROLOL TARTRATE 50 MG PO TABS
50.0000 mg | ORAL_TABLET | Freq: Two times a day (BID) | ORAL | Status: DC
  Administered 2020-05-03 – 2020-05-09 (×14): 50 mg via ORAL
  Filled 2020-05-03 (×14): qty 1

## 2020-05-03 MED ORDER — MORPHINE SULFATE (PF) 2 MG/ML IV SOLN
1.0000 mg | INTRAVENOUS | Status: DC | PRN
  Administered 2020-05-03 – 2020-05-04 (×3): 1 mg via INTRAVENOUS
  Filled 2020-05-03 (×2): qty 1

## 2020-05-03 MED ORDER — ENSURE ENLIVE PO LIQD
237.0000 mL | Freq: Two times a day (BID) | ORAL | Status: DC
  Administered 2020-05-05 – 2020-05-11 (×9): 237 mL via ORAL

## 2020-05-03 MED ORDER — BISACODYL 10 MG RE SUPP: 10.0000 mg | Freq: Every day | RECTAL | Status: DC | PRN

## 2020-05-03 MED ORDER — LEVOTHYROXINE SODIUM 100 MCG PO TABS
100.0000 ug | ORAL_TABLET | Freq: Every day | ORAL | Status: DC
  Administered 2020-05-04 – 2020-05-11 (×8): 100 ug via ORAL
  Filled 2020-05-03 (×8): qty 1

## 2020-05-03 MED ORDER — MIRTAZAPINE 15 MG PO TABS
7.5000 mg | ORAL_TABLET | Freq: Every day | ORAL | Status: DC
  Administered 2020-05-03 – 2020-05-10 (×8): 7.5 mg via ORAL
  Filled 2020-05-03 (×8): qty 1

## 2020-05-03 MED ORDER — BUPROPION HCL ER (XL) 150 MG PO TB24
150.0000 mg | ORAL_TABLET | Freq: Every day | ORAL | Status: DC
Start: 2020-05-03 — End: 2020-05-11
  Administered 2020-05-03 – 2020-05-11 (×9): 150 mg via ORAL
  Filled 2020-05-03 (×9): qty 1

## 2020-05-03 MED ORDER — HYDRALAZINE HCL 20 MG/ML IJ SOLN: 10.0000 mg | Freq: Four times a day (QID) | INTRAMUSCULAR | Status: DC | PRN

## 2020-05-03 MED ORDER — MEMANTINE HCL ER 14 MG PO CP24
14.0000 mg | ORAL_CAPSULE | Freq: Every day | ORAL | Status: DC
  Administered 2020-05-03 – 2020-05-11 (×8): 14 mg via ORAL
  Filled 2020-05-03 (×9): qty 1

## 2020-05-03 MED ORDER — SODIUM CHLORIDE 0.9 % IV SOLN
100.0000 mg | Freq: Two times a day (BID) | INTRAVENOUS | Status: DC
  Administered 2020-05-03 – 2020-05-05 (×4): 100 mg via INTRAVENOUS
  Filled 2020-05-03 (×5): qty 100

## 2020-05-03 MED ORDER — SODIUM CHLORIDE 0.9 % IV SOLN
1.0000 g | INTRAVENOUS | Status: DC
  Administered 2020-05-03 – 2020-05-09 (×7): 1 g via INTRAVENOUS
  Filled 2020-05-03 (×2): qty 1
  Filled 2020-05-03: qty 10
  Filled 2020-05-03: qty 0.5
  Filled 2020-05-03: qty 1
  Filled 2020-05-03: qty 10
  Filled 2020-05-03: qty 0.5
  Filled 2020-05-03 (×2): qty 1

## 2020-05-03 NOTE — Progress Notes (Signed)
  Echocardiogram 2D Echocardiogram has been performed.  Jennette Dubin 05/03/2020, 12:36 PM

## 2020-05-03 NOTE — Progress Notes (Signed)
PROGRESS NOTE    Patient: Lauren [name]                            PCP: Patient, No Pcp Per                    DOB: 1925-01-30            DOA: 05/02/2020 [id number]             DOS: 05/03/2020, 9:37 AM   LOS: 0 days   Date of Service: The patient was seen and examined on 05/03/2020  Subjective:   The patient was seen and examined this morning. Stable at this time. Confused, cooperative Otherwise no issues overnight .  Brief Narrative:   87 year old female with past medical history of dementia, hyperlipidemia, hypothyroidism, hyper tension and chronic kidney disease stage IIIa who presents to [facility] emergency department from [place] facility via EMS after patient fell out of bed. Patient is unable to provide a history due to advanced dementia.  Upon evaluation in the emergency department, right hip x-ray revealed a acute nondisplaced right femoral neck fracture.  Case was discussed with Dr. Alvan Dame with orthopedic surgery Planning for ORIF 05/03/2020   Assessment & Plan:   Principal Problem:   Fracture of femoral neck, right, closed ([place]) Active Problems:   Essential hypertension   Dementia without behavioral disturbance ([place])   Hypothyroidism   Chronic kidney disease, stage 3a ([place])   Pneumonia of both lungs due to infectious organism   Mixed hyperlipidemia  Principal Problem:    Fracture of femoral neck, right, closed ([place])  Patient suffered a fracture of the right femoral neck after a fall that occurred yesterday evening during the evening med Pass.  Fall apparently seem to be mechanical in nature.  No evidence of loss of consciousness or medical cause of the fall.  Case already discussed with Dr. Alvan Dame by the emergency room provider who graciously has agreed to come evaluate the patient the morning of 2/16 for possible operative intervention.  Appreciated orthopedic follow-up  Patient is currently n.p.o.  Continue with pain  management, as needed analgesics  Active Problems:    Pneumonia of both lungs due to infectious organism  Multifocal pneumonia noted on chest x-ray during his presentation similar to chest imaging performed in September 2021 --remains clinically stable but a mild leukocytosis  Will agree with empiric IV antibiotics for now Rocephin and doxycycline   SARS-CoV-2 negative,   Procalcitonin <0.10  CRP 2.3  Echocardiogram in the morning to evaluate further     Essential hypertension   Continue home regimen of antihypertensive therapy  At baseline    Dementia without behavioral disturbance [facility])   Patient exhibiting significant agitation and confusion in the setting of advanced dementia  Attempting to manage patient's pain adequately  Attempting to avoid additional painful stimuli as much as possible  Fall precautions  Frequent redirection    Hypothyroidism   Continue home regimen of levothyroxine    Chronic kidney disease, stage 3a (HCC)   Creatinine near baseline, 1.19, 1.04,  Strict input and output monitoring  Avoiding nephrotoxic agents if at all possible  Monitoring renal function and electrolytes with serial chemistries    Mixed hyperlipidemia   Continue home regimen of statin therapy    ------------------------------------------------------------------------------------------------------------------------------------ Nutritional status:  The patient's BMI is: Body mass index is 18.57 kg/m. I agree with the assessment and  plan as outlined --   ------------------------------------------------------------------------------------------------------------------------------------ Cultures; Blood Cultures x 2 >> NGT Urine Culture  >>> NGT    Antimicrobials:  IV antibiotics for now Rocephin and doxycycline   Consultants: Orthopedic  team   ------------------------------------------------------------------------------------------------------------------------------------------------  DVT prophylaxis:  SCD/Compression stockings Code Status:   Code Status: DNR  Family Communication: No family member present at bedside- they expressed understanding and agreement of above. -Advance care planning reviewed.   Admission status:   Status is: Observation  The patient remains OBS appropriate and will d/c before 2 midnights.  Dispo: The patient is from: SNF              Anticipated d/c is to: SNF              Anticipated d/c date is: 2 days              Patient currently is not medically stable to d/c.   Difficult to place patient No      Level of care: Telemetry   Procedures:   No admission procedures for hospital encounter.     Antimicrobials:  Anti-infectives (From admission, onward)   Start     Dose/Rate Route Frequency Ordered Stop   05/03/20 2200  cefTRIAXone (ROCEPHIN) 2 g in sodium chloride 0.9 % 100 mL IVPB  Status:  Discontinued        2 g 200 mL/hr over 30 Minutes Intravenous Every 24 hours 05/03/20 0136 05/03/20 0758   05/03/20 2200  cefTRIAXone (ROCEPHIN) 1 g in sodium chloride 0.9 % 100 mL IVPB        1 g 200 mL/hr over 30 Minutes Intravenous Every 24 hours 05/03/20 0800     05/03/20 1000  doxycycline (VIBRAMYCIN) 100 mg in sodium chloride 0.9 % 250 mL IVPB  Status:  Discontinued        100 mg 125 mL/hr over 120 Minutes Intravenous Every 12 hours 05/03/20 0136 05/03/20 0758   05/03/20 1000  doxycycline (VIBRAMYCIN) 100 mg in sodium chloride 0.9 % 250 mL IVPB        100 mg 125 mL/hr over 120 Minutes Intravenous Every 12 hours 05/03/20 0800     05/02/20 2315  cefTRIAXone (ROCEPHIN) 1 g in sodium chloride 0.9 % 100 mL IVPB        1 g 200 mL/hr over 30 Minutes Intravenous  Once 05/02/20 2305 05/03/20 0054   05/02/20 2315  doxycycline (VIBRA-TABS) tablet 100 mg        100 mg Oral  Once  05/02/20 2305 05/02/20 2315       Medication:  . atorvastatin  5 mg Oral Daily  . buPROPion  150 mg Oral Daily  . levothyroxine  100 mcg Oral Q0600  . mouth rinse  15 mL Mouth Rinse BID  . memantine  14 mg Oral Daily  . metoprolol tartrate  50 mg Oral BID  . mirtazapine  7.5 mg Oral QHS    bisacodyl, hydrALAZINE, morphine injection **OR** morphine injection, polyethylene glycol   Objective:   Vitals:   05/03/20 0100 05/03/20 0142 05/03/20 0143 05/03/20 0510  BP: (!) 155/89 (!) 192/87  (!) 180/88  Pulse: 79 85  70  Resp: 20 18  18   Temp: (!) 97.5 F (36.4 C) 98.7 F (37.1 C)  98.9 F (37.2 C)  TempSrc: Oral     SpO2: 97% 98%  100%  Weight:   55.4 kg   Height:   5\' 8"  (1.727 m)     Intake/Output  Summary (Last 24 hours) at 05/03/2020 7169 Last data filed at 05/03/2020 0700 Gross per 24 hour  Intake 129.97 ml  Output 500 ml  Net -370.03 ml   Filed Weights   05/03/20 0143  Weight: 55.4 kg     Examination:   Physical Exam  Constitution:  Alert, calm and comfortable (confused-baseline) Psychiatric: Normal and stable mood and affect, cognition intact,   HEENT: Normocephalic, PERRL, otherwise with in Normal limits  Chest:Chest symmetric Cardio vascular:  S1/S2, RRR, No murmure, No Rubs or Gallops  pulmonary: Clear to auscultation bilaterally, respirations unlabored, negative wheezes / crackles Abdomen: Soft, non-tender, non-distended, bowel sounds,no masses, no organomegaly Muscular skeletal: Limited exam - in bed, minimal movement of lower extremities, hip pain Neuro: CNII-XII intact. , normal motor and sensation, reflexes intact  Extremities: No pitting edema lower extremities, +2 pulses  Skin: Dry, warm to touch, negative for any Rashes, No open wounds Wounds: per nursing documentation    ------------------------------------------------------------------------------------------------------------------------------------------    LABs:  CBC Latest Ref Rng &  Units 05/03/2020 05/02/2020 12/08/2019  WBC 4.0 - 10.5 K/uL 16.0(H) 10.6(H) 10.1  Hemoglobin 12.0 - 15.0 g/dL 13.7 13.1 12.2  Hematocrit 36.0 - 46.0 % 43.5 41.8 39.3  Platelets 150 - 400 K/uL 170 189 126(L)   CMP Latest Ref Rng & Units 05/03/2020 05/02/2020 12/08/2019  Glucose 70 - 99 mg/dL 156(H) 165(H) 91  BUN 8 - 23 mg/dL 29(H) 31(H) 14  Creatinine 0.44 - 1.00 mg/dL 1.04(H) 1.19(H) 0.98  Sodium 135 - 145 mmol/L 140 142 141  Potassium 3.5 - 5.1 mmol/L 4.2 4.3 3.8  Chloride 98 - 111 mmol/L 104 104 104  CO2 22 - 32 mmol/L 22 28 24   Calcium 8.9 - 10.3 mg/dL 7.8(L) 7.7(L) 8.1(L)  Total Protein 6.5 - 8.1 g/dL 7.0 6.8 -  Total Bilirubin 0.3 - 1.2 mg/dL 1.0 0.7 -  Alkaline Phos 38 - 126 U/L 107 101 -  AST 15 - 41 U/L 41 36 -  ALT 0 - 44 U/L 35 32 -       Micro Results Recent Results (from the past 240 hour(s))  SARS CORONAVIRUS 2 (TAT 6-24 HRS) Nasopharyngeal Nasopharyngeal Swab     Status: None   Collection Time: 05/02/20 10:33 PM   Specimen: Nasopharyngeal Swab  Result Value Ref Range Status   SARS Coronavirus 2 NEGATIVE NEGATIVE Final    Comment: (NOTE) SARS-CoV-2 target nucleic acids are NOT DETECTED.  The SARS-CoV-2 RNA is generally detectable in upper and lower respiratory specimens during the acute phase of infection. Negative results do not preclude SARS-CoV-2 infection, do not rule out co-infections with other pathogens, and should not be used as the sole basis for treatment or other patient management decisions. Negative results must be combined with clinical observations, patient history, and epidemiological information. The expected result is Negative.  Fact Sheet for Patients: SugarRoll.be  Fact Sheet for Healthcare Providers: https://www.woods-mathews.com/  This test is not yet approved or cleared by the [place] FDA and  has been authorized for detection and/or diagnosis of SARS-CoV-2 by FDA under an Emergency Use  Authorization (EUA). This EUA will remain  in effect (meaning this test can be used) for the duration of the COVID-19 declaration under Se ction 564(b)(1) of the Act, 21 U.S.C. section 360bbb-3(b)(1), unless the authorization is terminated or revoked sooner.  Performed at [facility], [place] [address] [place], [place] [address]   Resp Panel by RT-PCR (Flu A&B, Covid) Nasopharyngeal Swab  Status: None   Collection Time: 05/03/20 12:44 AM   Specimen: Nasopharyngeal Swab; Nasopharyngeal(NP) swabs in vial transport medium  Result Value Ref Range Status   SARS Coronavirus 2 by RT PCR NEGATIVE NEGATIVE Final    Comment: (NOTE) SARS-CoV-2 target nucleic acids are NOT DETECTED.  The SARS-CoV-2 RNA is generally detectable in upper respiratory specimens during the acute phase of infection. The lowest concentration of SARS-CoV-2 viral copies this assay can detect is 138 copies/mL. A negative result does not preclude SARS-Cov-2 infection and should not be used as the sole basis for treatment or other patient management decisions. A negative result may occur with  improper specimen collection/handling, submission of specimen other than nasopharyngeal swab, presence of viral mutation(s) within the areas targeted by this assay, and inadequate number of viral copies(<138 copies/mL). A negative result must be combined with clinical observations, patient history, and epidemiological information. The expected result is Negative.  Fact Sheet for Patients:  EntrepreneurPulse.com.au  Fact Sheet for Healthcare Providers:  IncredibleEmployment.be  This test is no t yet approved or cleared by the [place] FDA and  has been authorized for detection and/or diagnosis of SARS-CoV-2 by FDA under an Emergency Use Authorization (EUA). This EUA will remain  in effect (meaning this test can be used) for the duration of the COVID-19 declaration under Section  564(b)(1) of the Act, 21 U.S.C.section 360bbb-3(b)(1), unless the authorization is terminated  or revoked sooner.       Influenza A by PCR NEGATIVE NEGATIVE Final   Influenza B by PCR NEGATIVE NEGATIVE Final    Comment: (NOTE) The Xpert Xpress SARS-CoV-2/FLU/RSV plus assay is intended as an aid in the diagnosis of influenza from Nasopharyngeal swab specimens and should not be used as a sole basis for treatment. Nasal washings and aspirates are unacceptable for Xpert Xpress SARS-CoV-2/FLU/RSV testing.  Fact Sheet for Patients: EntrepreneurPulse.com.au  Fact Sheet for Healthcare Providers: IncredibleEmployment.be  This test is not yet approved or cleared by the [place] FDA and has been authorized for detection and/or diagnosis of SARS-CoV-2 by FDA under an Emergency Use Authorization (EUA). This EUA will remain in effect (meaning this test can be used) for the duration of the COVID-19 declaration under Section 564(b)(1) of the Act, 21 U.S.C. section 360bbb-3(b)(1), unless the authorization is terminated or revoked.  Performed at [facility], [place] [address] [place], [place] [address]     Radiology Reports DG Chest 1 View  Result Date: 05/02/2020 CLINICAL DATA:  Fall EXAM: CHEST  1 VIEW COMPARISON:  12/05/2019 FINDINGS: Patchy focus of airspace disease in the right mid lung. Suspected consolidation left base with possible small effusion. Stable cardiomediastinal silhouette. No pneumothorax IMPRESSION: Patchy focus of airspace disease in the right mid lung with suspected patchy consolidation left base, possible pneumonia. Probable small left effusion. Electronically Signed   By: Donavan Foil M.D.   On: 05/02/2020 22:13   DG Hip Unilat W or Wo Pelvis 2-3 Views Right  Result Date: 05/02/2020 CLINICAL DATA:  Fall EXAM: DG HIP (WITH OR WITHOUT PELVIS) 2-3V RIGHT COMPARISON:  None. FINDINGS: SI joints are non widened. Pubic  symphysis appears intact. Acute nondisplaced right femoral neck fracture. No femoral head dislocation. IMPRESSION: Acute nondisplaced right femoral neck fracture. Electronically Signed   By: Donavan Foil M.D.   On: 05/02/2020 22:12    SIGNED: Deatra James, MD, FHM. [facility],  Pager (please use [email or web address] to page/text) Please use Epic Secure Chat for non-urgent communication (7AM-7PM)  If  7PM-7AM, please contact night-coverage [email or web address], 05/03/2020, 9:37 AM

## 2020-05-03 NOTE — TOC Initial Note (Signed)
Transition of Care [facility]) - Initial/Assessment Note    Patient Details  Name: Lauren [name] MRN: [id number] Date of Birth: 20-Sep-1924  Transition of Care [facility]) CM/SW Contact:    Ross Ludwig, LCSW Phone Number: 05/03/2020, 4:03 PM  Clinical Narrative:                  Patient is a 87 year old female who has advanced dementia.  Patient is from [facility] memory care ALF.  Patient had a fall at ALF, ortho met with patient's family they are trying to decide what they want to do.  CSW to continue to follow patient's progress throughout discharge planning.  Expected Discharge Plan: Memory Care ([facility]) Barriers to Discharge: Continued Medical Work up   Patient Goals and CMS Choice Patient states their goals for this hospitalization and ongoing recovery are:: To return back to Memory Care ALF      Expected Discharge Plan and Services Expected Discharge Plan: Memory Care ([place])       Living arrangements for the past 2 months: [place] (Memory care)                                      Prior Living Arrangements/Services Living arrangements for the past 2 months: [place] (Memory care) Lives with:: Facility Resident Patient language and need for interpreter reviewed:: Yes        Need for Family Participation in Patient Care: Yes (Comment) Care giver support system in place?: Yes (comment)   Criminal Activity/Legal Involvement Pertinent to Current Situation/Hospitalization: No - Comment as needed  Activities of Daily Living Home Assistive Devices/Equipment: Oxygen ADL Screening (condition at time of admission) Patient's cognitive ability adequate to safely complete daily activities?: No Is the patient deaf or have difficulty hearing?: No Does the patient have difficulty seeing, even when wearing glasses/contacts?: No Does the patient have difficulty concentrating, remembering, or  making decisions?: Yes Patient able to express need for assistance with ADLs?: Yes Does the patient have difficulty dressing or bathing?: Yes Independently performs ADLs?: No Communication: Independent Dressing (OT): Needs assistance Is this a change from baseline?: Pre-admission baseline Grooming: Needs assistance Is this a change from baseline?: Pre-admission baseline Feeding: Independent Bathing: Needs assistance Is this a change from baseline?: Pre-admission baseline Toileting: Needs assistance Is this a change from baseline?: Pre-admission baseline In/Out Bed: Needs assistance Is this a change from baseline?: Pre-admission baseline Walks in Home: Independent Does the patient have difficulty walking or climbing stairs?: No Weakness of Legs: None Weakness of Arms/Hands: None  Permission Sought/Granted Permission sought to share information with : Facility Contact Representative,Family Supports Permission granted to share information with : Yes, Release of Information Signed  Share Information with NAME: Blankley,Cathy Daughter [phone]           Emotional Assessment Appearance:: Appears stated age   Affect (typically observed): Accepting,Calm,Appropriate Orientation: : Oriented to Self Alcohol / Substance Use: Not Applicable Psych Involvement: No (comment)  Admission diagnosis:  Fracture of femoral neck, right, closed ([place]) [S72.001A] Closed fracture of neck of right femur, initial encounter ([place]) [S72.001A] Patient Active Problem List   Diagnosis Date Noted  . Fracture of femoral neck, right, closed ([place]) 05/03/2020  . Mixed hyperlipidemia 05/03/2020  . Acute respiratory failure with hypoxemia ([place]) 12/05/2019  . Acute hypoxemic respiratory failure ([place]) 12/05/2019  . Pneumonia  of both lungs due to infectious organism 12/02/2019  . CAP (community acquired pneumonia) 12/01/2019  . Respiratory failure ([place]) 12/01/2019  . Hypotension 12/01/2019  . Chronic kidney  disease, stage 3a ([place]) 09/01/2017  . Osteoporosis 10/21/2016  . Weight gain 04/16/2016  . Hypocalcemia 04/16/2016  . Hypothyroidism 04/16/2016  . Edema of right foot 04/16/2016  . Dementia without behavioral disturbance ([place]) 10/31/2015  . 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 Loss of weight 03/24/2014  . Essential hypertension   . High cholesterol   . Anxiety    PCP:  Patient, No Pcp Per Pharmacy:   [facility] [address] [place] [address] Phone: [phone] Fax: [phone]     Social Determinants of Health (SDOH) Interventions    Readmission Risk Interventions No flowsheet data found.

## 2020-05-03 NOTE — Progress Notes (Signed)
Initial Nutrition Assessment  DOCUMENTATION CODES:   Non-severe (moderate) malnutrition in context of chronic illness  INTERVENTION:  - will order Ensure Enlive po BID, each supplement provides 350 kcal and 20 grams of protein - monitor for need to adjust diet texture.   NUTRITION DIAGNOSIS:   Moderate Malnutrition related to chronic illness (advanced dementia) as evidenced by mild fat depletion,mild muscle depletion,moderate fat depletion,moderate muscle depletion.  GOAL:   Patient will meet greater than or equal to 90% of their needs  MONITOR:   PO intake,Supplement acceptance,Labs,Weight trends  REASON FOR ASSESSMENT:   Consult Hip fracture protocol  ASSESSMENT:   87 year old female with medical history of advanced dementia, HLD, hypothyroidism, HTN, and stage 3 CKD. She presented to the ED from [facility] skilled nursing facility via EMS after patient fell out of bed. In the ED, R hip xray indicated nondisplaced R femoral neck fx.  Diet advanced from NPO to Regular today at 1155 and plan to return to NPO at midnight.  Patient laying in bed and noted to be a/o to self only. Daughter at bedside and provides all information.   She reports that for several months patient had been slowly, steadily losing weight, but that in recent weeks patient has been doing better cognitively and overall and that she gained a pound recently.  Weight today is 122 lb and PTA the most recently documented weight was on 12/05/19 when she weighed 118 lb. Weight on 05/31/19 was 127 lb which indicates 5 lb weight loss (4% body weight) which is not significant for time frame.   Daughter is unsure if patient receives altered texture foods at [facility]. She reports that this AM patient coughed with a sip of water. RN, who was at bedside, reported that patient coughed while taking PO pills. Daughter denies [employer] staff ever making her aware of patient coughing or having any difficulties  with PO intakes.    Labs reviewed; BUN: 29 mg/dl, creatinine: 1.04 mg/dl, Ca: 7.8 mg/dl, GFR: 50 ml/min.  Medications reviewed; 100 mcg oral synthroid/day. IVF; LR @ 75 ml/hr.     NUTRITION - FOCUSED PHYSICAL EXAM:  Flowsheet Row Most Recent Value  Orbital Region Mild depletion  Upper Arm Region Moderate depletion  Thoracic and Lumbar Region Unable to assess  Buccal Region Mild depletion  Temple Region Moderate depletion  Clavicle Bone Region Moderate depletion  Clavicle and Acromion Bone Region Moderate depletion  Scapular Bone Region Unable to assess  Dorsal Hand Mild depletion  Patellar Region Moderate depletion  Anterior Thigh Region Unable to assess  Posterior Calf Region Moderate depletion  Edema (RD Assessment) None  Hair Reviewed  Eyes Reviewed  Mouth Unable to assess  Skin Reviewed  Nails Reviewed       Diet Order:   Diet Order            Diet regular Room service appropriate? Yes; Fluid consistency: Thin  Diet effective now                 EDUCATION NEEDS:   No education needs have been identified at this time  Skin:  Skin Assessment: Reviewed RN Assessment  Last BM:  PTA/unknown  Height:   Ht Readings from Last 1 Encounters:  05/03/20 5\' 8"  (1.727 m)    Weight:   Wt Readings from Last 1 Encounters:  05/03/20 55.4 kg    Estimated Nutritional Needs:  Kcal:  1390-1600 kcal Protein:  60-75 grams Fluid:  >/= 1.8 L/day  Kyeisha Janowicz, MS, RD, LDN, CNSC Inpatient Clinical Dietitian RD pager # available in AMION  After hours/weekend pager # available in AMION  

## 2020-05-03 NOTE — ED Notes (Signed)
ED TO INPATIENT HANDOFF REPORT  Name/Age/Gender Lauren [name] 87 y.o. female  Code Status Code Status History    Date Active Date Inactive Code Status Order ID Comments User Context   12/05/2019 2127 12/09/2019 2030 DNR [id number]  Roney Jaffe, MD ED   12/02/2019 1058 12/04/2019 2238 DNR [id number]  Florencia Reasons, MD Inpatient   12/01/2019 1811 12/02/2019 1058 Full Code [id number]  Harold Hedge, MD ED   05/31/2019 1450 12/01/2019 1203 DNR [id number]  [name], NP Outpatient   Advance Care Planning Activity    Questions for Most Recent Historical Code Status (Order [id number])    Question Answer   In the event of cardiac or respiratory ARREST Do not call a "code blue"   In the event of cardiac or respiratory ARREST Do not perform Intubation, CPR, defibrillation or ACLS   In the event of cardiac or respiratory ARREST Use medication by any route, position, wound care, and other measures to relive pain and suffering. May use oxygen, suction and manual treatment of airway obstruction as needed for comfort.      Home/SNF/Other Nursing Home  Chief Complaint Fracture of femoral neck, right, closed ([place]) [S72.001A]  Level of Care/Admitting Diagnosis ED Disposition    ED Disposition Condition [place]: [place] [100102]  Level of Care: Telemetry [5]  Admit to tele based on following criteria: Monitor for Ischemic changes  Covid Evaluation: Asymptomatic Screening Protocol (No Symptoms)  Diagnosis: Fracture of femoral neck, right, closed [facility]) [403474]  Admitting Physician: Vernelle Emerald [[id number]]  Attending Physician: Vernelle Emerald [[id number]]       Medical History Past Medical History:  Diagnosis Date  . Anxiety   . Cancer ([place]) 12/01/2018   skin cancer on her head(L)  . High cholesterol   . Hypertension   . Osteoporosis   . Rosacea   . Thyroid disease   . Weight loss     Allergies Allergies  Allergen Reactions  .  Aricept [Donepezil Hcl] Other (See Comments)    Unknown reaction  . Aspirin Other (See Comments)    Upset stomach     IV Location/Drains/Wounds Patient Lines/Drains/Airways Status    Active Line/Drains/Airways    Name Placement date Placement time Site Days   Peripheral IV 05/03/20 Left Antecubital 05/03/20  0055  Antecubital  less than 1          Labs/Imaging Results for orders placed or performed during the hospital encounter of 05/02/20 (from the past 48 hour(s))  CBC with Differential     Status: Abnormal   Collection Time: 05/02/20 10:31 PM  Result Value Ref Range   WBC 10.6 (H) 4.0 - 10.5 K/uL   RBC 4.28 3.87 - 5.11 MIL/uL   Hemoglobin 13.1 12.0 - 15.0 g/dL   HCT 41.8 36.0 - 46.0 %   MCV 97.7 80.0 - 100.0 fL   MCH 30.6 26.0 - 34.0 pg   MCHC 31.3 30.0 - 36.0 g/dL   RDW 14.8 11.5 - 15.5 %   Platelets 189 150 - 400 K/uL   nRBC 0.0 0.0 - 0.2 %   Neutrophils Relative % 82 %   Neutro Abs 8.7 (H) 1.7 - 7.7 K/uL   Lymphocytes Relative 10 %   Lymphs Abs 1.1 0.7 - 4.0 K/uL   Monocytes Relative 7 %   Monocytes Absolute 0.7 0.1 - 1.0 K/uL   Eosinophils Relative 0 %   Eosinophils Absolute 0.0 0.0 - 0.5  K/uL   Basophils Relative 0 %   Basophils Absolute 0.0 0.0 - 0.1 K/uL   Immature Granulocytes 1 %   Abs Immature Granulocytes 0.11 (H) 0.00 - 0.07 K/uL    Comment: Performed at [facility], [place] [address], [place], [place] [address]  Comprehensive metabolic panel     Status: Abnormal   Collection Time: 05/02/20 10:31 PM  Result Value Ref Range   Sodium 142 135 - 145 mmol/L   Potassium 4.3 3.5 - 5.1 mmol/L   Chloride 104 98 - 111 mmol/L   CO2 28 22 - 32 mmol/L   Glucose, Bld 165 (H) 70 - 99 mg/dL    Comment: Glucose reference range applies only to samples taken after fasting for at least 8 hours.   BUN 31 (H) 8 - 23 mg/dL   Creatinine, Ser 1.19 (H) 0.44 - 1.00 mg/dL   Calcium 7.7 (L) 8.9 - 10.3 mg/dL   Total Protein 6.8 6.5 - 8.1 g/dL   Albumin 3.9  3.5 - 5.0 g/dL   AST 36 15 - 41 U/L   ALT 32 0 - 44 U/L   Alkaline Phosphatase 101 38 - 126 U/L   Total Bilirubin 0.7 0.3 - 1.2 mg/dL   GFR, Estimated 42 (L) >60 mL/min    Comment: (NOTE) Calculated using the CKD-EPI Creatinine Equation (2021)    Anion gap 10 5 - 15    Comment: Performed at [facility], [place] [address] [place], [place] [address]  Protime-INR     Status: None   Collection Time: 05/02/20 10:31 PM  Result Value Ref Range   Prothrombin Time 13.8 11.4 - 15.2 seconds   INR 1.1 0.8 - 1.2    Comment: (NOTE) INR goal varies based on device and disease states. Performed at [facility], [place] [address] [place], [place] [address]   Urinalysis, Routine w reflex microscopic Urine, Clean Catch     Status: Abnormal   Collection Time: 05/02/20 10:33 PM  Result Value Ref Range   Color, Urine YELLOW YELLOW   APPearance CLEAR CLEAR   Specific Gravity, Urine 1.014 1.005 - 1.030   pH 6.0 5.0 - 8.0   Glucose, UA NEGATIVE NEGATIVE mg/dL   Hgb urine dipstick NEGATIVE NEGATIVE   Bilirubin Urine NEGATIVE NEGATIVE   Ketones, ur NEGATIVE NEGATIVE mg/dL   Protein, ur 30 (A) NEGATIVE mg/dL   Nitrite NEGATIVE NEGATIVE   Leukocytes,Ua NEGATIVE NEGATIVE   RBC / HPF 6-10 0 - 5 RBC/hpf   WBC, UA 0-5 0 - 5 WBC/hpf   Bacteria, UA NONE SEEN NONE SEEN    Comment: Performed at [facility], [place] [address] [place],  [address]   DG Chest 1 View  Result Date: 05/02/2020 CLINICAL DATA:  Fall EXAM: CHEST  1 VIEW COMPARISON:  12/05/2019 FINDINGS: Patchy focus of airspace disease in the right mid lung. Suspected consolidation left base with possible small effusion. Stable cardiomediastinal silhouette. No pneumothorax IMPRESSION: Patchy focus of airspace disease in the right mid lung with suspected patchy consolidation left base, possible pneumonia. Probable small left effusion. Electronically Signed   By: Donavan Foil M.D.   On:  05/02/2020 22:13   DG Hip Unilat W or Wo Pelvis 2-3 Views Right  Result Date: 05/02/2020 CLINICAL DATA:  Fall EXAM: DG HIP (WITH OR WITHOUT PELVIS) 2-3V RIGHT COMPARISON:  None. FINDINGS: SI joints are non widened. Pubic symphysis appears intact. Acute nondisplaced right femoral neck fracture. No femoral  head dislocation. IMPRESSION: Acute nondisplaced right femoral neck fracture. Electronically Signed   By: Donavan Foil M.D.   On: 05/02/2020 22:12    Pending Labs Unresulted Labs (From admission, onward)          Start     Ordered   05/03/20 0045  C-reactive protein  Add-on,   AD        05/03/20 0045   05/03/20 0045  Procalcitonin - Baseline  Add-on,   AD        05/03/20 0045   05/03/20 0044  Resp Panel by RT-PCR (Flu A&B, Covid) Nasopharyngeal Swab  (Tier 2 - Symptomatic/asymptomatic with Precautions )  Once,   STAT       Question Answer Comment  Is this test for diagnosis or screening Diagnosis of ill patient   Symptomatic for COVID-19 as defined by CDC Yes   Date of Symptom Onset 05/02/2020   Hospitalized for COVID-19 Yes   Admitted to ICU for COVID-19 No   Previously tested for COVID-19 Yes   Resident in a congregate (group) care setting No   Employed in healthcare setting No   Pregnant No   Has patient completed COVID vaccination(s) (2 doses of Pfizer/Moderna 1 dose of [employer]) Yes   Has patient completed COVID Booster / 3rd dose Unknown      05/03/20 0044   05/02/20 2221  SARS CORONAVIRUS 2 (TAT 6-24 HRS) Nasopharyngeal Nasopharyngeal Swab  (Tier 3 - Symptomatic/asymptomatic with Precautions)  Once,   STAT       Question Answer Comment  Is this test for diagnosis or screening Screening   Symptomatic for COVID-19 as defined by CDC No   Hospitalized for COVID-19 No   Admitted to ICU for COVID-19 No   Previously tested for COVID-19 Yes   Resident in a congregate (group) care setting Yes   Employed in healthcare setting No   Pregnant No   Has patient completed  COVID vaccination(s) (2 doses of Pfizer/Moderna 1 dose of [employer]) Yes   Has patient completed COVID Booster / 3rd dose Yes      05/02/20 2220          Vitals/Pain Today's Vitals   05/02/20 2212 05/02/20 2230 05/02/20 2300 05/03/20 0059  BP:  (!) 189/103 (!) 188/83   Pulse:  73 70   Resp:  14 16   Temp:      TempSrc:      SpO2:  94% (!) 88%   PainSc: 7    Asleep    Isolation Precautions Airborne and Contact precautions  Medications Medications  fentaNYL (SUBLIMAZE) injection 25 mcg (25 mcg Intravenous Given 05/02/20 2132)  fentaNYL (SUBLIMAZE) injection 50 mcg (50 mcg Intravenous Given 05/02/20 2241)  cefTRIAXone (ROCEPHIN) 1 g in sodium chloride 0.9 % 100 mL IVPB (0 g Intravenous Stopped 05/03/20 0054)  doxycycline (VIBRA-TABS) tablet 100 mg (100 mg Oral Given 05/02/20 2315)    Mobility walks with device

## 2020-05-03 NOTE — Plan of Care (Signed)
  Problem: Coping: Goal: Level of anxiety will decrease Outcome: Progressing   Problem: Elimination: Goal: Will not experience complications related to urinary retention Outcome: Progressing   

## 2020-05-03 NOTE — Progress Notes (Signed)
ORTHOPAEDIC CONSULTATION  REQUESTING PHYSICIAN: Deatra James, MD  PCP:  Patient, No Pcp Per  Chief Complaint: Fall, right hip pain   HPI: Lauren [name] is a 87 y.o. female who presented to [facility] ED on 05/02/20 via EMS after a fall at her residence at [facility]. EMS reported that the patient fell out of bed. Patient provides limited history due to advanced dementia. Patient reports she was doing her exercises when she fell. In the ED, imaging revealed right femoral neck fracture. Dr. Alvan Dame was consulted for orthopaedic management.  Today, on exam, she is resting comfortably in bed. She is alert and cooperative, and is able to have a limited conversation. I called her daughter, [name], who is coming to the hospital today. She reports that her mom does walk with a walker or use a wheelchair. She is not on anticoagulation.   Past Medical History:  Diagnosis Date  . Anxiety   . Cancer ([place]) 12/01/2018   skin cancer on her head([place])  . High cholesterol   . Hypertension   . Osteoporosis   . Rosacea   . Thyroid disease   . Weight loss    Past Surgical History:  Procedure Laterality Date  . CHOLECYSTECTOMY  1959   [place]  . THYROIDECTOMY  2003   [place]  . TONSILLECTOMY AND ADENOIDECTOMY     Social History   Socioeconomic History  . Marital status: Widowed    Spouse name: Not on file  . Number of children: Not on file  . Years of education: Not on file  . Highest education level: Not on file  Occupational History  . Not on file  Tobacco Use  . Smoking status: Never Smoker  . Smokeless tobacco: Never Used  Vaping Use  . Vaping Use: Never used  Substance and Sexual Activity  . Alcohol use: No    Alcohol/week: 0.0 standard drinks  . Drug use: No  . Sexual activity: Not Currently  Other Topics Concern  . Not on file  Social History Narrative   As of 03/24/2014:   Diet-good   Widow   Lives in [place], 1 stories, 1 person, no pets   Past/current  profession- receptionist   No exercise    Social Determinants of Health   Financial Resource Strain: Not on file  Food Insecurity: Not on file  Transportation Needs: Not on file  Physical Activity: Not on file  Stress: Not on file  Social Connections: Not on file   Family History  Problem Relation Age of Onset  . Hypertension Mother   . Stroke Mother   . Stroke Father   . Lung cancer Daughter    Allergies  Allergen Reactions  . Aricept [Donepezil Hcl] Other (See Comments)    Unknown reaction  . Aspirin Other (See Comments)    Upset stomach    Prior to Admission medications   Medication Sig Start Date End Date Taking? Authorizing Provider  acetaminophen (TYLENOL) 500 MG tablet Take 500 mg by mouth every 8 (eight) hours as needed for moderate pain.   Yes [provider]  atorvastatin (LIPITOR) 10 MG tablet Take 0.5 tablets (5 mg total) by mouth daily. 01/19/19  Yes [name], NP  buPROPion (WELLBUTRIN XL) 150 MG 24 hr tablet TAKE 1 TABLET DAILY IN THE MORNING FOR ANXIETY Patient taking differently: Take 150 mg by mouth daily. 06/03/19  Yes [name], NP  levothyroxine (SYNTHROID) 100 MCG tablet Take 100 mcg by  mouth daily before breakfast.   Yes [provider]  memantine (NAMENDA XR) 14 MG CP24 24 hr capsule TAKE 1 CAPSULE DAILY FOR MEMORY Patient taking differently: Take 14 mg by mouth daily. 01/18/19  Yes [name], NP  metoprolol tartrate (LOPRESSOR) 50 MG tablet TAKE 1 TABLET TWICE A DAY Patient taking differently: Take 50 mg by mouth 2 (two) times daily. 09/22/18  Yes [name], NP  mirtazapine (REMERON) 7.5 MG tablet Take 7.5 mg by mouth at bedtime.   Yes [provider]  Nutritional Supplements (ENSURE PO) Take 1 Can by mouth in the morning and at bedtime.    Yes [provider]  OXYGEN Inhale 2 L into the lungs daily as needed (patient comfort).   Yes [provider]  bethanechol (URECHOLINE) 10  MG tablet Take 1 tablet (10 mg total) by mouth 3 (three) times daily. Patient not taking: Reported on 05/02/2020 12/09/19   Hosie Poisson, MD  guaiFENesin (MUCINEX) 600 MG 12 hr tablet Take 1 tablet (600 mg total) by mouth 2 (two) times daily. Patient not taking: Reported on 05/02/2020 12/04/19   Florencia Reasons, MD  mirtazapine (REMERON) 15 MG tablet Take 1 tablet (15 mg total) by mouth at bedtime. Patient not taking: Reported on 05/02/2020 12/04/19 01/03/20  Florencia Reasons, MD   DG Chest 1 View  Result Date: 05/02/2020 CLINICAL DATA:  Fall EXAM: CHEST  1 VIEW COMPARISON:  12/05/2019 FINDINGS: Patchy focus of airspace disease in the right mid lung. Suspected consolidation left base with possible small effusion. Stable cardiomediastinal silhouette. No pneumothorax IMPRESSION: Patchy focus of airspace disease in the right mid lung with suspected patchy consolidation left base, possible pneumonia. Probable small left effusion. Electronically Signed   By: Donavan Foil M.D.   On: 05/02/2020 22:13   DG Hip Unilat W or Wo Pelvis 2-3 Views Right  Result Date: 05/02/2020 CLINICAL DATA:  Fall EXAM: DG HIP (WITH OR WITHOUT PELVIS) 2-3V RIGHT COMPARISON:  None. FINDINGS: SI joints are non widened. Pubic symphysis appears intact. Acute nondisplaced right femoral neck fracture. No femoral head dislocation. IMPRESSION: Acute nondisplaced right femoral neck fracture. Electronically Signed   By: Donavan Foil M.D.   On: 05/02/2020 22:12    Positive ROS: All other systems have been reviewed and were otherwise negative with the exception of those mentioned in the HPI and as above.  Physical Exam: General: Alert, no acute distress Cardiovascular: No pedal edema Respiratory: No cyanosis, no use of accessory musculature GI: No organomegaly, abdomen is soft and non-tender Skin: No lesions in the area of chief complaint Neurologic: Sensation intact distally Psychiatric: Patient is competent for consent with normal mood and  affect Lymphatic: No axillary or cervical lymphadenopathy  MUSCULOSKELETAL:  Right Hip Exam: Skin intact. No skin lesions. Compartments soft. Distal pulses intact. She is able to wiggle her toes on command.   Assessment: Right femoral neck fracture  Plan: I discussed the diagnosis of right femoral neck fracture with Ms. Mallen and her daughter, [name]. We discussed treatment options, including non-operative management vs surgical treatment with hip replacement. At this point, they are undecided on surgery based on her age and advanced dementia. We will let her eat today, and have Dr. Alvan Dame discuss with them this evening. Possible surgery tomorrow if decided. Okay for one dose of Lovenox now, hold anticoagulation after that. Continue pain management as needed.     Maurice March, PA-C Cell [phone]   05/03/2020 11:25 AM

## 2020-05-03 NOTE — H&P (Signed)
History and Physical    Lauren [name] EXH:[id number] DOB: [birth year] DOA: 05/02/2020  PCP: Patient, No Pcp Per  Patient coming from: [name] via EMS   Chief Complaint: Fall, RLE pain   HPI:    87 year old female with past medical history of dementia, hyperlipidemia, hypothyroidism, hyper tension and chronic kidney disease stage IIIa who presents to [facility] emergency department from [facility] skilled nursing facility via EMS after patient fell out of bed.  Patient is unable to provide a history due to advanced dementia.  Per discussion with emergency department staff and review of records patient fell out of bed while at [facility] skilled nursing facility the evening of 2/15.  It is of head injury during the fall and no witnessed loss of consciousness.  Immediately after the fall, the patient complained of right lower extremity pain.  EMS was contacted who promptly came to evaluate the patient and brought the patient to [facility] emergency department for evaluation.  Upon evaluation in the emergency department, right hip x-ray revealed a acute nondisplaced right femoral neck fracture.  Case was discussed with Dr. Alvan Dame with orthopedic surgery recommended the patient be n.p.o. after midnight orthopedic surgery consultation for consideration of intervention in the morning.  The hospitalist group was then called to assess the patient for mission to the hospital.  Review of Systems:   Review of Systems  Unable to perform ROS: Dementia    Past Medical History:  Diagnosis Date  . Anxiety   . Cancer ([place]) 12/01/2018   skin cancer on her head(L)  . High cholesterol   . Hypertension   . Osteoporosis   . Rosacea   . Thyroid disease   . Weight loss     Past Surgical History:  Procedure Laterality Date  . CHOLECYSTECTOMY  1959   [place]  . THYROIDECTOMY  2003   [place]  . TONSILLECTOMY AND ADENOIDECTOMY        reports that she has never smoked. She has never used smokeless tobacco. She reports that she does not drink alcohol and does not use drugs.  Allergies  Allergen Reactions  . Aricept [Donepezil Hcl] Other (See Comments)    Unknown reaction  . Aspirin Other (See Comments)    Upset stomach     Family History  Problem Relation Age of Onset  . Hypertension Mother   . Stroke Mother   . Stroke Father   . Lung cancer Daughter      Prior to Admission medications   Medication Sig Start Date End Date Taking? Authorizing Provider  acetaminophen (TYLENOL) 500 MG tablet Take 500 mg by mouth every 8 (eight) hours as needed for moderate pain.   Yes [provider]  atorvastatin (LIPITOR) 10 MG tablet Take 0.5 tablets (5 mg total) by mouth daily. 01/19/19  Yes [name], NP  buPROPion (WELLBUTRIN XL) 150 MG 24 hr tablet TAKE 1 TABLET DAILY IN THE MORNING FOR ANXIETY Patient taking differently: Take 150 mg by mouth daily. 06/03/19  Yes [name], NP  levothyroxine (SYNTHROID) 100 MCG tablet Take 100 mcg by mouth daily before breakfast.   Yes [provider]  memantine (NAMENDA XR) 14 MG CP24 24 hr capsule TAKE 1 CAPSULE DAILY FOR MEMORY Patient taking differently: Take 14 mg by mouth daily. 01/18/19  Yes [name], NP  metoprolol tartrate (LOPRESSOR) 50 MG tablet TAKE 1 TABLET TWICE A DAY Patient taking differently: Take 50 mg by  mouth 2 (two) times daily. 09/22/18  Yes [name], NP  mirtazapine (REMERON) 7.5 MG tablet Take 7.5 mg by mouth at bedtime.   Yes [provider]  Nutritional Supplements (ENSURE PO) Take 1 Can by mouth in the morning and at bedtime.    Yes [provider]  OXYGEN Inhale 2 L into the lungs daily as needed (patient comfort).   Yes [provider]  bethanechol (URECHOLINE) 10 MG tablet Take 1 tablet (10 mg total) by mouth 3 (three) times daily. Patient not taking: Reported on 05/02/2020 12/09/19    Hosie Poisson, MD  guaiFENesin (MUCINEX) 600 MG 12 hr tablet Take 1 tablet (600 mg total) by mouth 2 (two) times daily. Patient not taking: Reported on 05/02/2020 12/04/19   Florencia Reasons, MD  mirtazapine (REMERON) 15 MG tablet Take 1 tablet (15 mg total) by mouth at bedtime. Patient not taking: Reported on 05/02/2020 12/04/19 01/03/20  Florencia Reasons, MD    Physical Exam: Vitals:   05/02/20 2300 05/03/20 0100 05/03/20 0142 05/03/20 0143  BP: (!) 188/83 (!) 155/89 (!) 192/87   Pulse: 70 79 85   Resp: 16 20 18    Temp:  (!) 97.5 F (36.4 C) 98.7 F (37.1 C)   TempSrc:  Oral    SpO2: (!) 88% 97% 98%   Weight:    55.4 kg  Height:    5\' 8"  (1.727 m)    Constitutional: Lethargic arousable and oriented x1.  Patient is in mild distress due to agitation and right lower extremity pain.     Skin: no rashes, no lesions, poor skin turgor noted. Eyes: Pupils are equally reactive to light.  No evidence of scleral icterus or conjunctival pallor.  ENMT: Dry mucous membranes noted.  Posterior pharynx clear of any exudate or lesions.   Neck: normal, supple, no masses, no thyromegaly.  No evidence of jugular venous distension.   Respiratory: clear to auscultation bilaterally, no wheezing, mild bibasilar rales noted.  Normal respiratory effort. No accessory muscle use.  Cardiovascular: Regular rate and rhythm, no murmurs / rubs / gallops. No extremity edema. 2+ pedal pulses. No carotid bruits.  Chest:   Nontender without crepitus or deformity.   Back:   Nontender without crepitus or deformity. Abdomen: Abdomen is soft and nontender.  No evidence of intra-abdominal masses.  Positive bowel sounds noted in all quadrants.   Musculoskeletal: Significant pain with both passive and active range of motion of the right hip.  No significant deformities noted of the extremities however. no contractures. Normal muscle tone.  Neurologic: Patient is lethargic but arousable and or x1.  Patient is intermittently agitated throughout  the interview.  Patient does not consistently follow commands.  Patient is moving all 4 extremities spontaneously.  Sensation is grossly intact.  Sponsored verbal and painful stimuli.  Psychiatric: Unable to evaluate fully due to significant agitation and confusion.  Patient currently does not seem to possess insight as to her current situation.   Labs on Admission: I have personally reviewed following labs and imaging studies -   CBC: Recent Labs  Lab 05/02/20 2231  WBC 10.6*  NEUTROABS 8.7*  HGB 13.1  HCT 41.8  MCV 97.7  PLT 174   Basic Metabolic Panel: Recent Labs  Lab 05/02/20 2231  NA 142  K 4.3  CL 104  CO2 28  GLUCOSE 165*  BUN 31*  CREATININE 1.19*  CALCIUM 7.7*   GFR: Estimated Creatinine Clearance: 25.3 mL/min (A) (by C-G formula based on  SCr of 1.19 mg/dL (H)). Liver Function Tests: Recent Labs  Lab 05/02/20 2231  AST 36  ALT 32  ALKPHOS 101  BILITOT 0.7  PROT 6.8  ALBUMIN 3.9   No results for input(s): LIPASE, AMYLASE in the last 168 hours. No results for input(s): AMMONIA in the last 168 hours. Coagulation Profile: Recent Labs  Lab 05/02/20 2231  INR 1.1   Cardiac Enzymes: No results for input(s): CKTOTAL, CKMB, CKMBINDEX, TROPONINI in the last 168 hours. BNP (last 3 results) No results for input(s): PROBNP in the last 8760 hours. HbA1C: No results for input(s): HGBA1C in the last 72 hours. CBG: No results for input(s): GLUCAP in the last 168 hours. Lipid Profile: No results for input(s): CHOL, HDL, LDLCALC, TRIG, CHOLHDL, LDLDIRECT in the last 72 hours. Thyroid Function Tests: No results for input(s): TSH, T4TOTAL, FREET4, T3FREE, THYROIDAB in the last 72 hours. Anemia Panel: No results for input(s): VITAMINB12, FOLATE, FERRITIN, TIBC, IRON, RETICCTPCT in the last 72 hours. Urine analysis:    Component Value Date/Time   COLORURINE YELLOW 05/02/2020 2233   APPEARANCEUR CLEAR 05/02/2020 2233   LABSPEC 1.014 05/02/2020 2233   PHURINE  6.0 05/02/2020 [place] 05/02/2020 2233   HGBUR NEGATIVE 05/02/2020 2233   [place] 05/02/2020 2233   KETONESUR NEGATIVE 05/02/2020 2233   PROTEINUR 30 (A) 05/02/2020 2233   UROBILINOGEN 0.2 02/12/2014 1015   NITRITE NEGATIVE 05/02/2020 2233   LEUKOCYTESUR NEGATIVE 05/02/2020 2233    Radiological Exams on Admission - Personally Reviewed: DG Chest 1 View  Result Date: 05/02/2020 CLINICAL DATA:  Fall EXAM: CHEST  1 VIEW COMPARISON:  12/05/2019 FINDINGS: Patchy focus of airspace disease in the right mid lung. Suspected consolidation left base with possible small effusion. Stable cardiomediastinal silhouette. No pneumothorax IMPRESSION: Patchy focus of airspace disease in the right mid lung with suspected patchy consolidation left base, possible pneumonia. Probable small left effusion. Electronically Signed   By: Donavan Foil M.D.   On: 05/02/2020 22:13   DG Hip Unilat W or Wo Pelvis 2-3 Views Right  Result Date: 05/02/2020 CLINICAL DATA:  Fall EXAM: DG HIP (WITH OR WITHOUT PELVIS) 2-3V RIGHT COMPARISON:  None. FINDINGS: SI joints are non widened. Pubic symphysis appears intact. Acute nondisplaced right femoral neck fracture. No femoral head dislocation. IMPRESSION: Acute nondisplaced right femoral neck fracture. Electronically Signed   By: Donavan Foil M.D.   On: 05/02/2020 22:12    EKG: Personally reviewed.  Rhythm is normal sinus rhythm with heart rate of 78 bpm.  No dynamic ST segment changes appreciated.  Assessment/Plan Principal Problem:   Fracture of femoral neck, right, closed ([place])  Patient suffered a fracture of the right femoral neck after a fall that occurred yesterday evening during the evening med Pass.  Fall apparently seem to be mechanical in nature.  No evidence of loss of consciousness or medical cause of the fall.  Case already discussed with Dr. Alvan Dame by the emergency room provider who graciously has agreed to come evaluate the patient the  morning of 2/16 for possible operative intervention.  Will await orthopedic surgery's input, which is appreciated.  Patient is currently n.p.o.  As needed intravenous analgesics for substantial associated pain  Active Problems:   Pneumonia of both lungs due to infectious organism  Multifocal pneumonia noted on chest x-ray during his presentation similar to chest imaging performed in September 2021  That being said, patient does possess a substantial leukocytosis of 16,000 but no obvious clinical symptoms consistent  with pneumonia  Will empirically treat with intravenous ceftriaxone and doxycycline for now  In the meantime obtaining Covid testing, CRP, procalcitonin, BNP and echocardiogram in the morning to evaluate further  If this initial work-up is not helpful in identifying an etiology then repeat CT imaging may be warranted later in the hospitalization.    Essential hypertension   Continue home regimen of antihypertensive therapy    Dementia without behavioral disturbance ([place])   Patient exhibiting significant agitation and confusion in the setting of advanced dementia  Attempting to manage patient's pain adequately  Attempting to avoid additional painful stimuli as much as possible  Fall precautions  Frequent redirection    Hypothyroidism   Continue home regimen of levothyroxine    Chronic kidney disease, stage 3a (HCC)   Creatinine near baseline  Strict input and output monitoring  Avoiding nephrotoxic agents if at all possible  Monitoring renal function and electrolytes with serial chemistries    Mixed hyperlipidemia   Continue home regimen of statin therapy   Code Status:  DNR Family Communication: Unsuccessful in contacting family  Status is: Observation  The patient remains OBS appropriate and will d/c before 2 midnights.  Dispo: The patient is from: SNF              Anticipated d/c is to: SNF              Anticipated d/c date is: 2  days              Patient currently is not medically stable to d/c.   Difficult to place patient No        Vernelle Emerald MD [facility] Pager [phone]  If 7PM-7AM, please contact night-coverage [email or web address] Use universal [place] password for that web site. If you do not have the password, please call the hospital operator.  05/03/2020, 4:33 AM

## 2020-05-04 ENCOUNTER — Inpatient Hospital Stay (HOSPITAL_COMMUNITY): Payer: Medicare HMO | Admitting: Anesthesiology

## 2020-05-04 ENCOUNTER — Encounter (HOSPITAL_COMMUNITY)
Admission: EM | Disposition: A | Discharge status: Skilled Nursing Facility | Payer: Self-pay | Source: Skilled Nursing Facility | Attending: Family Medicine

## 2020-05-04 ENCOUNTER — Inpatient Hospital Stay (HOSPITAL_COMMUNITY): Payer: Medicare HMO

## 2020-05-04 ENCOUNTER — Encounter (HOSPITAL_COMMUNITY): Payer: Self-pay | Admitting: Internal Medicine

## 2020-05-04 DIAGNOSIS — E440 Moderate protein-calorie malnutrition: Secondary | ICD-10-CM | POA: Insufficient documentation

## 2020-05-04 HISTORY — PX: TOTAL HIP ARTHROPLASTY: SHX124

## 2020-05-04 SURGERY — ARTHROPLASTY, HIP, TOTAL,POSTERIOR APPROACH
Anesthesia: General | Site: Hip

## 2020-05-04 MED ORDER — TRANEXAMIC ACID-NACL 1000-0.7 MG/100ML-% IV SOLN
1000.0000 mg | Freq: Once | INTRAVENOUS | Status: AC
  Administered 2020-05-05: 1000 mg via INTRAVENOUS
  Filled 2020-05-04: qty 100

## 2020-05-04 MED ORDER — LABETALOL HCL 5 MG/ML IV SOLN
5.0000 mg | INTRAVENOUS | Status: AC | PRN
  Administered 2020-05-04 (×2): 5 mg via INTRAVENOUS

## 2020-05-04 MED ORDER — KETAMINE HCL 10 MG/ML IJ SOLN
INTRAMUSCULAR | Status: DC | PRN
  Administered 2020-05-04 (×2): 30 mg via INTRAVENOUS

## 2020-05-04 MED ORDER — LABETALOL HCL 5 MG/ML IV SOLN
INTRAVENOUS | Status: AC
  Administered 2020-05-04: 5 mg via INTRAVENOUS
  Filled 2020-05-04: qty 4

## 2020-05-04 MED ORDER — POVIDONE-IODINE 10 % EX SWAB: 2.0000 "application " | Freq: Once | CUTANEOUS | Status: DC

## 2020-05-04 MED ORDER — FENTANYL CITRATE (PF) 100 MCG/2ML IJ SOLN
INTRAMUSCULAR | Status: DC | PRN
  Administered 2020-05-04: 25 ug via INTRAVENOUS
  Administered 2020-05-04: 50 ug via INTRAVENOUS
  Administered 2020-05-04: 25 ug via INTRAVENOUS

## 2020-05-04 MED ORDER — LIDOCAINE 2% (20 MG/ML) 5 ML SYRINGE
INTRAMUSCULAR | Status: DC | PRN
  Administered 2020-05-04: 80 mg via INTRAVENOUS

## 2020-05-04 MED ORDER — PHENYLEPHRINE 40 MCG/ML (10ML) SYRINGE FOR IV PUSH (FOR BLOOD PRESSURE SUPPORT)
PREFILLED_SYRINGE | INTRAVENOUS | Status: DC | PRN
  Administered 2020-05-04: 80 ug via INTRAVENOUS

## 2020-05-04 MED ORDER — METOCLOPRAMIDE HCL 5 MG/ML IJ SOLN: 5.0000 mg | Freq: Three times a day (TID) | INTRAMUSCULAR | Status: DC | PRN

## 2020-05-04 MED ORDER — PHENOL 1.4 % MT LIQD: 1.0000 | OROMUCOSAL | Status: DC | PRN

## 2020-05-04 MED ORDER — CHLORHEXIDINE GLUCONATE CLOTH 2 % EX PADS
6.0000 | MEDICATED_PAD | Freq: Every day | CUTANEOUS | Status: DC
  Administered 2020-05-05 – 2020-05-11 (×8): 6 via TOPICAL

## 2020-05-04 MED ORDER — ONDANSETRON HCL 4 MG/2ML IJ SOLN: 4.0000 mg | Freq: Four times a day (QID) | INTRAMUSCULAR | Status: DC | PRN

## 2020-05-04 MED ORDER — PROPOFOL 10 MG/ML IV BOLUS
INTRAVENOUS | Status: AC
  Filled 2020-05-04: qty 20

## 2020-05-04 MED ORDER — SODIUM CHLORIDE 0.9 % IV SOLN: INTRAVENOUS | Status: DC

## 2020-05-04 MED ORDER — CEFAZOLIN SODIUM-DEXTROSE 2-4 GM/100ML-% IV SOLN
2.0000 g | Freq: Four times a day (QID) | INTRAVENOUS | Status: AC
  Administered 2020-05-05 (×2): 2 g via INTRAVENOUS
  Filled 2020-05-04 (×2): qty 100

## 2020-05-04 MED ORDER — STERILE WATER FOR IRRIGATION IR SOLN
Status: DC | PRN
  Administered 2020-05-04: 2000 mL

## 2020-05-04 MED ORDER — KETAMINE HCL 10 MG/ML IJ SOLN
INTRAMUSCULAR | Status: AC
  Filled 2020-05-04: qty 1

## 2020-05-04 MED ORDER — METHOCARBAMOL 500 MG PO TABS
500.0000 mg | ORAL_TABLET | Freq: Four times a day (QID) | ORAL | Status: DC | PRN
  Administered 2020-05-05: 500 mg via ORAL
  Filled 2020-05-04: qty 1

## 2020-05-04 MED ORDER — PHENYLEPHRINE HCL (PRESSORS) 10 MG/ML IV SOLN
INTRAVENOUS | Status: AC
  Filled 2020-05-04: qty 1

## 2020-05-04 MED ORDER — METHOCARBAMOL 1000 MG/10ML IJ SOLN
500.0000 mg | Freq: Four times a day (QID) | INTRAVENOUS | Status: DC | PRN
  Filled 2020-05-04: qty 5

## 2020-05-04 MED ORDER — PROPOFOL 10 MG/ML IV BOLUS
INTRAVENOUS | Status: DC | PRN
  Administered 2020-05-04: 70 mg via INTRAVENOUS

## 2020-05-04 MED ORDER — POVIDONE-IODINE 10 % EX SWAB
2.0000 "application " | Freq: Once | CUTANEOUS | Status: AC
  Administered 2020-05-04: 2 via TOPICAL

## 2020-05-04 MED ORDER — MORPHINE SULFATE (PF) 2 MG/ML IV SOLN
0.5000 mg | INTRAVENOUS | Status: DC | PRN
  Administered 2020-05-05: 1 mg via INTRAVENOUS
  Filled 2020-05-04: qty 1

## 2020-05-04 MED ORDER — ACETAMINOPHEN 500 MG PO TABS
500.0000 mg | ORAL_TABLET | Freq: Four times a day (QID) | ORAL | Status: DC | PRN
  Administered 2020-05-05 – 2020-05-10 (×3): 500 mg via ORAL
  Filled 2020-05-04 (×3): qty 1

## 2020-05-04 MED ORDER — ONDANSETRON HCL 4 MG PO TABS: 4.0000 mg | ORAL_TABLET | Freq: Four times a day (QID) | ORAL | Status: DC | PRN

## 2020-05-04 MED ORDER — 0.9 % SODIUM CHLORIDE (POUR BTL) OPTIME
TOPICAL | Status: DC | PRN
  Administered 2020-05-04: 1000 mL

## 2020-05-04 MED ORDER — LIDOCAINE HCL (PF) 2 % IJ SOLN
INTRAMUSCULAR | Status: AC
  Filled 2020-05-04: qty 5

## 2020-05-04 MED ORDER — ROCURONIUM BROMIDE 10 MG/ML (PF) SYRINGE
PREFILLED_SYRINGE | INTRAVENOUS | Status: DC | PRN
  Administered 2020-05-04: 50 mg via INTRAVENOUS

## 2020-05-04 MED ORDER — FERROUS SULFATE 325 (65 FE) MG PO TABS
325.0000 mg | ORAL_TABLET | Freq: Three times a day (TID) | ORAL | Status: DC
Start: 2020-05-05 — End: 2020-05-11
  Administered 2020-05-05 – 2020-05-11 (×13): 325 mg via ORAL
  Filled 2020-05-04 (×14): qty 1

## 2020-05-04 MED ORDER — METOCLOPRAMIDE HCL 5 MG PO TABS: 5.0000 mg | ORAL_TABLET | Freq: Three times a day (TID) | ORAL | Status: DC | PRN

## 2020-05-04 MED ORDER — CHLORHEXIDINE GLUCONATE 4 % EX LIQD: 60.0000 mL | Freq: Once | CUTANEOUS | Status: DC

## 2020-05-04 MED ORDER — SUGAMMADEX SODIUM 200 MG/2ML IV SOLN
INTRAVENOUS | Status: DC | PRN
  Administered 2020-05-04: 200 mg via INTRAVENOUS

## 2020-05-04 MED ORDER — TRANEXAMIC ACID-NACL 1000-0.7 MG/100ML-% IV SOLN
1000.0000 mg | INTRAVENOUS | Status: AC
  Administered 2020-05-04: 1000 mg via INTRAVENOUS
  Filled 2020-05-04: qty 100

## 2020-05-04 MED ORDER — TRAMADOL HCL 50 MG PO TABS
50.0000 mg | ORAL_TABLET | Freq: Four times a day (QID) | ORAL | Status: DC | PRN
  Administered 2020-05-05 – 2020-05-06 (×2): 100 mg via ORAL
  Administered 2020-05-09 – 2020-05-10 (×2): 50 mg via ORAL
  Filled 2020-05-04: qty 2
  Filled 2020-05-04: qty 1
  Filled 2020-05-04: qty 2
  Filled 2020-05-04 (×2): qty 1

## 2020-05-04 MED ORDER — ASPIRIN EC 81 MG PO TBEC
81.0000 mg | DELAYED_RELEASE_TABLET | Freq: Two times a day (BID) | ORAL | Status: DC
  Administered 2020-05-05 – 2020-05-11 (×13): 81 mg via ORAL
  Filled 2020-05-04 (×12): qty 1

## 2020-05-04 MED ORDER — CEFAZOLIN SODIUM-DEXTROSE 2-4 GM/100ML-% IV SOLN
2.0000 g | INTRAVENOUS | Status: AC
  Administered 2020-05-04: 2 g via INTRAVENOUS
  Filled 2020-05-04: qty 100

## 2020-05-04 MED ORDER — FENTANYL CITRATE (PF) 100 MCG/2ML IJ SOLN
INTRAMUSCULAR | Status: AC
  Filled 2020-05-04: qty 2

## 2020-05-04 MED ORDER — MENTHOL 3 MG MT LOZG: 1.0000 | LOZENGE | OROMUCOSAL | Status: DC | PRN

## 2020-05-04 MED ORDER — LACTATED RINGERS IV SOLN: INTRAVENOUS | Status: DC

## 2020-05-04 MED ORDER — HYDRALAZINE HCL 20 MG/ML IJ SOLN
10.0000 mg | INTRAMUSCULAR | Status: DC | PRN
  Administered 2020-05-05 – 2020-05-09 (×6): 10 mg via INTRAVENOUS
  Filled 2020-05-04 (×8): qty 1

## 2020-05-04 MED ORDER — DEXAMETHASONE SODIUM PHOSPHATE 10 MG/ML IJ SOLN
INTRAMUSCULAR | Status: DC | PRN
  Administered 2020-05-04: 5 mg via INTRAVENOUS

## 2020-05-04 MED ORDER — DOCUSATE SODIUM 100 MG PO CAPS
100.0000 mg | ORAL_CAPSULE | Freq: Two times a day (BID) | ORAL | Status: DC
  Administered 2020-05-05 – 2020-05-11 (×11): 100 mg via ORAL
  Filled 2020-05-04 (×14): qty 1

## 2020-05-04 SURGICAL SUPPLY — 54 items
ADH SKN CLS APL DERMABOND .7 (GAUZE/BANDAGES/DRESSINGS) ×1
BAG DECANTER FOR FLEXI CONT (MISCELLANEOUS) ×2 IMPLANT
BAG SPEC THK2 15X12 ZIP CLS (MISCELLANEOUS) ×1
BAG ZIPLOCK 12X15 (MISCELLANEOUS) ×2 IMPLANT
BLADE SAW SGTL 11.0X1.19X90.0M (BLADE) IMPLANT
BLADE SAW SGTL 18X1.27X75 (BLADE) ×2 IMPLANT
BLADE SURG SZ10 CARB STEEL (BLADE) ×4 IMPLANT
COVER SURGICAL LIGHT HANDLE (MISCELLANEOUS) ×2 IMPLANT
COVER WAND RF STERILE (DRAPES) IMPLANT
DERMABOND ADVANCED (GAUZE/BANDAGES/DRESSINGS) ×1
DERMABOND ADVANCED .7 DNX12 (GAUZE/BANDAGES/DRESSINGS) ×1 IMPLANT
DRAPE ORTHO SPLIT 77X108 STRL (DRAPES) ×4
DRAPE POUCH INSTRU U-SHP 10X18 (DRAPES) ×2 IMPLANT
DRAPE SURG 17X11 SM STRL (DRAPES) ×2 IMPLANT
DRAPE SURG ORHT 6 SPLT 77X108 (DRAPES) ×2 IMPLANT
DRAPE U-SHAPE 47X51 STRL (DRAPES) ×2 IMPLANT
DRESSING AQUACEL AG SP 3.5X10 (GAUZE/BANDAGES/DRESSINGS) ×1 IMPLANT
DRSG AQUACEL AG SP 3.5X10 (GAUZE/BANDAGES/DRESSINGS) ×2
DURAPREP 26ML APPLICATOR (WOUND CARE) ×2 IMPLANT
ELECT BLADE TIP CTD 4 INCH (ELECTRODE) ×2 IMPLANT
ELECT REM PT RETURN 15FT ADLT (MISCELLANEOUS) ×2 IMPLANT
FACESHIELD WRAPAROUND (MASK) ×8 IMPLANT
FACESHIELD WRAPAROUND OR TEAM (MASK) ×4 IMPLANT
GLOVE BIOGEL M 7.0 STRL (GLOVE) IMPLANT
GLOVE ECLIPSE 8.0 STRL XLNG CF (GLOVE) ×2 IMPLANT
GLOVE ORTHO TXT STRL SZ7.5 (GLOVE) ×4 IMPLANT
GLOVE SURG UNDER POLY LF SZ7.5 (GLOVE) ×4 IMPLANT
GLOVE SURG UNDER POLY LF SZ8.5 (GLOVE) ×2 IMPLANT
GOWN STRL REUS W/TWL LRG LVL3 (GOWN DISPOSABLE) ×2 IMPLANT
GOWN STRL REUS W/TWL XL LVL3 (GOWN DISPOSABLE) ×4 IMPLANT
HEAD FEM UNIPOLAR 49 OD STRL (Hips) ×1 IMPLANT
KIT TURNOVER KIT A (KITS) ×2 IMPLANT
MANIFOLD NEPTUNE II (INSTRUMENTS) ×2 IMPLANT
MARKER SKIN DUAL TIP RULER LAB (MISCELLANEOUS) ×2 IMPLANT
NDL SAFETY ECLIPSE 18X1.5 (NEEDLE) ×1 IMPLANT
NEEDLE HYPO 18GX1.5 SHARP (NEEDLE) ×2
NS IRRIG 1000ML POUR BTL (IV SOLUTION) ×2 IMPLANT
PADDING CAST COTTON 6X4 STRL (CAST SUPPLIES) ×2 IMPLANT
PENCIL SMOKE EVACUATOR (MISCELLANEOUS) IMPLANT
PROTECTOR NERVE ULNAR (MISCELLANEOUS) ×2 IMPLANT
SPACER FEM TAPERED +0 12/14 (Hips) ×1 IMPLANT
STEM FEMORAL SZ8 STD ACTIS (Stem) ×1 IMPLANT
SUCTION FRAZIER HANDLE 10FR (MISCELLANEOUS) ×2
SUCTION TUBE FRAZIER 10FR DISP (MISCELLANEOUS) ×1 IMPLANT
SUT MNCRL AB 4-0 PS2 18 (SUTURE) ×2 IMPLANT
SUT STRATAFIX 0 PDS 27 VIOLET (SUTURE) ×2
SUT VIC AB 1 CT1 36 (SUTURE) ×6 IMPLANT
SUT VIC AB 2-0 CT1 27 (SUTURE) ×4
SUT VIC AB 2-0 CT1 TAPERPNT 27 (SUTURE) ×2 IMPLANT
SUTURE STRATFX 0 PDS 27 VIOLET (SUTURE) ×1 IMPLANT
SYR 50ML LL SCALE MARK (SYRINGE) ×2 IMPLANT
TOWEL OR 17X26 10 PK STRL BLUE (TOWEL DISPOSABLE) ×4 IMPLANT
TRAY FOLEY MTR SLVR 16FR STAT (SET/KITS/TRAYS/PACK) ×2 IMPLANT
WATER STERILE IRR 1000ML POUR (IV SOLUTION) ×2 IMPLANT

## 2020-05-04 NOTE — Progress Notes (Addendum)
Patient ID: Lauren [name], female   DOB: 03-Nov-1924, 87 y.o.   MRN: [id number]  Stable without noted events  Right femoral neck fracture  I will plan to discuss with her HCPOA plans for surgery today  Indications include pain relief and maintenance of functionality despite h/o dementia  NPO now with plans for surgery around noon today  I spoke to her daughter around 7:30 and reviewed the indications and associated risks of surgery.  We will plan to proceed with a right hip hemiarthroplasty today.

## 2020-05-04 NOTE — H&P (View-Only) (Signed)
Patient ID: Lauren [name], female   DOB: 1924-12-23, 87 y.o.   MRN: [id number]  Stable without noted events  Right femoral neck fracture  I will plan to discuss with her HCPOA plans for surgery today  Indications include pain relief and maintenance of functionality despite h/o dementia  NPO now with plans for surgery around noon today  I spoke to her daughter around 7:30 and reviewed the indications and associated risks of surgery.  We will plan to proceed with a right hip hemiarthroplasty today.

## 2020-05-04 NOTE — Plan of Care (Signed)
?  Problem: Clinical Measurements: ?Goal: Will remain free from infection ?Outcome: Progressing ?Goal: Respiratory complications will improve ?Outcome: Progressing ?  ?Problem: Elimination: ?Goal: Will not experience complications related to bowel motility ?Outcome: Progressing ?Goal: Will not experience complications related to urinary retention ?Outcome: Progressing ?  ?

## 2020-05-04 NOTE — Anesthesia Postprocedure Evaluation (Signed)
Anesthesia Post Note  Patient: Lauren [name]  Procedure(s) Performed: RIGHT HIP HEMIARTHROPLASTY (N/A Hip)     Patient location during evaluation: PACU Anesthesia Type: General Level of consciousness: awake and alert Pain management: pain level controlled Vital Signs Assessment: post-procedure vital signs reviewed and stable Respiratory status: spontaneous breathing, nonlabored ventilation, respiratory function stable and patient connected to nasal cannula oxygen Cardiovascular status: blood pressure returned to baseline and stable Postop Assessment: no apparent nausea or vomiting Anesthetic complications: no   No complications documented.  Last Vitals:  Vitals:   05/04/20 1930 05/04/20 1945  BP: (!) 183/110 (!) 171/92  Pulse: 81 93  Resp: 16 20  Temp:    SpO2: 93% 98%    Last Pain:  Vitals:   05/04/20 1911  TempSrc:   PainSc: Asleep                 Kaizen Ibsen

## 2020-05-04 NOTE — Op Note (Signed)
NAME:  Lauren [name]                ACCOUNT NO.:  [id number]   MEDICAL RECORD NO.: [id number]   LOCATION:  3419                         FACILITY:  [facility]   DATE OF BIRTH:  09/08/1924  PHYSICIAN:  Pietro Cassis. Alvan Dame, M.D.     DATE OF PROCEDURE:  05/04/2020                               OPERATIVE REPORT     PREOPERATIVE DIAGNOSIS:  Right displaced femoral neck fracture.   POSTOPERATIVE DIAGNOSIS:  Right displaced femoral neck fracture.   PROCEDURE:  Right hip hemiarthroplasty utilizing DePuy component, size 8 standard Actis stem with a 49 unipolar ball with a +0 adapter.   SURGEON:  Pietro Cassis. Alvan Dame, MD   ASSISTANT:  Danae Orleans, PA-C.   ANESTHESIA:  General.   SPECIMENS:  None.   DRAINS:  None.   BLOOD LOSS:  About 100 cc.   COMPLICATIONS:  None.   INDICATION OF PROCEDURE:  Ms Lauren [name] is a 87 year old female with dementia who lives at [facility], a memory care unti.  She unfortunately reportedly fell out of her bed.  She had to be transported to the ER due to pain. She was admitted to the hospital after radiographs revealed a femoral neck fracture.  She was seen and evaluated and was scheduled for surgery for fixation.  The necessity of surgical repair was discussed with her daughter, HCPOA.  Consent was obtained after reviewing risks of infection, DVT, component failure, and need for revision surgery.   PROCEDURE IN DETAIL:  The patient was brought to the operative theater. Once adequate anesthesia, preoperative antibiotics, 2 g of Ancef administered, the patient was positioned into the left lateral decubitus position with the right side up.  The right lower extremity was then prepped and draped in sterile fashion.  A time-out was performed identifying the patient, planned procedure, and extremity.   A lateral incision was made off the proximal trochanter. Sharp dissection was carried down to the iliotibial band and gluteal fascia. The gluteal fascia was then  incised for posterior approach.  The short external rotators were taken down separate from the posterior capsule. An L capsulotomy was made preserving the posterior leaflet for later anatomic repair. Fracture site was identified and after removing comminuted segments of the posterior femoral neck, the femoral head was removed without difficulty and measured on the back table  using the sizing rings and determined to be 49 mm in diameter.   The proximal femur was then exposed.  Retractors placed.  I then drilled, opened the proximal femur.  Then I hand reamed once and  Irrigated the canal to try to prevent fat emboli.  I began broaching the femur with a starter broach up to a size 8 broach with good medial and lateral metaphyseal fit without evidence of any torsion or movement.  A trial reduction was carried out with a standard offset neck and a +0 adapter with a 49 mm ball.  The hip reduced nicely.  The leg lengths appeared to be equal compared to the down leg.   The hip went through a range of motion without evidence of any subluxation or impingement.   Given these findings, the trial components removed.  The final 8 standard  Actis stem was opened.  After irrigating the canal, the final stem was impacted and sat at the level where the broach was. Based on this and the trial reduction, a +0 adapter was opened and impacted in the 49 mm unipolar ball onto a clean and dry trunnion.  The hip had been irrigated throughout the case and again at this point.  I re- Approximated the posterior capsule to the superior leaflet using a  #1 Vicryl.  The remainder of the wound was closed with #1 Vicryl in the iliotibial band and gluteal fascia, a  2-0 Vicryl in the sub-Q tissue and a running 4-0 Monocryl in the skin.  The hip was cleaned, dried, and dressed sterilely using Dermabond and Aquacel dressing.  She was then brought to recovery room, extubated in stable condition, tolerating the procedure  well.  Danae Orleans, PA-C was present and utilized as Environmental consultant for the entire case from  Preoperative positioning to management of the contralateral extremity and retractors to  General facilitation of the procedure.  He was also involved with primary wound closure.         Pietro Cassis Alvan Dame, M.D.

## 2020-05-04 NOTE — Anesthesia Procedure Notes (Signed)
Procedure Name: Intubation Date/Time: 05/04/2020 5:39 PM Performed by: Gerald Leitz, CRNA Pre-anesthesia Checklist: Patient identified, Patient being monitored, Timeout performed, Emergency Drugs available and Suction available Patient Re-evaluated:Patient Re-evaluated prior to induction Oxygen Delivery Method: Circle system utilized Preoxygenation: Pre-oxygenation with 100% oxygen Induction Type: IV induction Ventilation: Mask ventilation without difficulty Laryngoscope Size: Mac and 3 Grade View: Grade I Tube type: Oral Tube size: 7.0 mm Number of attempts: 1 Airway Equipment and Method: Stylet Placement Confirmation: ETT inserted through vocal cords under direct vision,  positive ETCO2 and breath sounds checked- equal and bilateral Secured at: 21 cm Tube secured with: Tape Dental Injury: Teeth and Oropharynx as per pre-operative assessment

## 2020-05-04 NOTE — Progress Notes (Signed)
Chaplain engaged in initial visit with Lauren [name] and her daughter.  Chaplain offered prayer over daughter who is experiencing some anxiety as she makes major health decisions for her mom and thinks about the care she will need.  Chaplain affirmed [name]'s ability to advocate and honor her mom while uplifting how hard it is to be a caregiver.  Chaplain provided support, listening, and prayer.     05/04/20 1300  Clinical Encounter Type  Visited With Patient and family together  Visit Type Initial

## 2020-05-04 NOTE — Interval H&P Note (Signed)
History and Physical Interval Note:  05/04/2020 5:28 PM  Lauren [name]  has presented today for surgery, with the diagnosis of right femoral neck fracture.  The various methods of treatment have been discussed with the patient and family. After consideration of risks, benefits and other options for treatment, the patient has consented to  Procedure(s): RIGHT HIP HEMIARTHROPLASTY (N/A) as a surgical intervention.  The patient's history has been reviewed, patient examined, no change in status, stable for surgery.  I have reviewed the patient's chart and labs.  Questions were answered to the patient's satisfaction.     Mauri Pole

## 2020-05-04 NOTE — Transfer of Care (Signed)
Immediate Anesthesia Transfer of Care Note  Patient: Lauren [name]  Procedure(s) Performed: RIGHT HIP HEMIARTHROPLASTY (N/A Hip)  Patient Location: PACU  Anesthesia Type:General  Level of Consciousness: awake and drowsy  Airway & Oxygen Therapy: Patient Spontanous Breathing and Patient connected to face mask oxygen  Post-op Assessment: Report given to RN and Post -op Vital signs reviewed and stable  Post vital signs: Reviewed and stable  Last Vitals:  Vitals Value Taken Time  BP 204/101 05/04/20 1911  Temp    Pulse    Resp 15 05/04/20 1913  SpO2    Vitals shown include unvalidated device data.  Last Pain:  Vitals:   05/04/20 1635  TempSrc:   PainSc: 0-No pain      Patients Stated Pain Goal: 3 (40/98/11 9147)  Complications: No complications documented.

## 2020-05-04 NOTE — Progress Notes (Signed)
PROGRESS NOTE    Patient: Lauren [name]                            PCP: Patient, No Pcp Per                    DOB: 1924-11-23            DOA: 05/02/2020 ZOX:[id number]             DOS: 05/04/2020, 10:09 AM   LOS: 1 day   Date of Service: The patient was seen and examined on 05/04/2020  Subjective:   The patient was seen and examined this, sleepy, confused at baseline Hemodynamically stable No issues overnight per nursing staff   Brief Narrative:   87 year old female with past medical history of dementia, hyperlipidemia, hypothyroidism, hyper tension and chronic kidney disease stage IIIa who presents to [facility] long hospital emergency department from [place] facility via EMS after patient fell out of bed. Patient is unable to provide a history due to advanced dementia.  Upon evaluation in the emergency department, right hip x-ray revealed a acute nondisplaced right femoral neck fracture.  Case was discussed with Dr. Alvan Dame with orthopedic surgery Planning for ORIF 05/04/2020  -     Assessment & Plan:   Principal Problem:   Fracture of femoral neck, right, closed ([employer]) Active Problems:   Essential hypertension   Dementia without behavioral disturbance ([place])   Hypothyroidism   Chronic kidney disease, stage 3a ([place])   Pneumonia of both lungs due to infectious organism   Mixed hyperlipidemia   Malnutrition of moderate degree  Principal Problem:    Fracture of femoral neck, right, closed ([place])  Patient suffered a fracture of the right femoral neck after a fall on evening of 05/02/2020   fall apparently seem to be mechanical in nature. ,  No evidence of LOC, syncope, or meds   Orthopedic team, Dr. Alvan Dame following planning for ORIF 2/17,   Patient is currently n.p.o   Continue with pain management, as needed analgesics  Active Problems:    Pneumonia of both lungs due to infectious organism   Multifocal pneumonia noted on chest x-ray during his  presentation similar to chest imaging performed in September 2021 --remains clinically stable but a mild leukocytosis  Will agree with empiric IV antibiotics for now Rocephin and doxycycline  Hemodynamically stable afebrile normotensive   SARS-CoV-2 negative,   Procalcitonin <0.10  CRP 2.3 Echocardiogram  >> pending    Essential hypertension N.p.o., mildly hypertensive As needed hydralazine     Dementia without behavioral disturbance ([place])   Patient exhibiting significant agitation and confusion in the setting of advanced dementia  Attempting to manage patient's pain adequately  Attempting to avoid additional painful stimuli as much as possible  Fall precautions  Frequent redirection    Hypothyroidism   Continue home regimen of levothyroxine    Chronic kidney disease, stage 3a ([place])   Creatinine near baseline, 1.19, 1.04,  Strict input and output monitoring  Avoiding nephrotoxic agents if at all possible  Monitoring renal function and electrolytes with serial chemistries    Mixed hyperlipidemia   Continue home regimen of statin therapy    ------------------------------------------------------------------------------------------------------------------------------------ Nutritional status:  The patient's BMI is: Body mass index is 18.57 kg/m. I agree with the assessment and plan as outlined --   ------------------------------------------------------------------------------------------------------------------------------------ Cultures; Blood Cultures x 2 >> NGT Urine Culture  >>> NGT  Antimicrobials:  IV antibiotics for now Rocephin and doxycycline   Consultants: Orthopedic team   ------------------------------------------------------------------------------------------------------------------------------------  DVT prophylaxis:  SCD/Compression stockings Code Status:   Code Status: DNR  Family Communication: No family member  present at bedside- they expressed understanding and agreement of above. -Advance care planning reviewed.   Admission status:   Status is: Inpatient  The patient remains OBS appropriate and will d/c before 2 midnights.  Dispo: The patient is from: SNF              Anticipated d/c is to: SNF              Anticipated d/c date is: 2 days              Patient currently is not medically stable to d/c.   Difficult to place patient No  Level of care: Telemetry   Procedures:   No admission procedures for hospital encounter.     Antimicrobials:  Anti-infectives (From admission, onward)   Start     Dose/Rate Route Frequency Ordered Stop   05/03/20 2200  cefTRIAXone (ROCEPHIN) 2 g in sodium chloride 0.9 % 100 mL IVPB  Status:  Discontinued        2 g 200 mL/hr over 30 Minutes Intravenous Every 24 hours 05/03/20 0136 05/03/20 0758   05/03/20 2200  cefTRIAXone (ROCEPHIN) 1 g in sodium chloride 0.9 % 100 mL IVPB        1 g 200 mL/hr over 30 Minutes Intravenous Every 24 hours 05/03/20 0800     05/03/20 1000  doxycycline (VIBRAMYCIN) 100 mg in sodium chloride 0.9 % 250 mL IVPB  Status:  Discontinued        100 mg 125 mL/hr over 120 Minutes Intravenous Every 12 hours 05/03/20 0136 05/03/20 0758   05/03/20 1000  doxycycline (VIBRAMYCIN) 100 mg in sodium chloride 0.9 % 250 mL IVPB        100 mg 125 mL/hr over 120 Minutes Intravenous Every 12 hours 05/03/20 0800     05/02/20 2315  cefTRIAXone (ROCEPHIN) 1 g in sodium chloride 0.9 % 100 mL IVPB        1 g 200 mL/hr over 30 Minutes Intravenous  Once 05/02/20 2305 05/03/20 0054   05/02/20 2315  doxycycline (VIBRA-TABS) tablet 100 mg        100 mg Oral  Once 05/02/20 2305 05/02/20 2315       Medication:  . atorvastatin  5 mg Oral Daily  . buPROPion  150 mg Oral Daily  . chlorhexidine  15 mL Mouth Rinse BID  . feeding supplement  237 mL Oral BID BM  . levothyroxine  100 mcg Oral Q0600  . mouth rinse  15 mL Mouth Rinse BID  . memantine   14 mg Oral Daily  . metoprolol tartrate  50 mg Oral BID  . mirtazapine  7.5 mg Oral QHS    bisacodyl, hydrALAZINE, morphine injection **OR** morphine injection, polyethylene glycol   Objective:   Vitals:   05/03/20 2000 05/03/20 2300 05/04/20 0009 05/04/20 0336  BP: (!) 191/107 (!) 214/100 (!) 179/79 (!) 167/81  Pulse: 90   75  Resp: (!) 21   (!) 21  Temp: 98.5 F (36.9 C)   98.3 F (36.8 C)  TempSrc: Oral   Oral  SpO2: 96%     Weight:      Height:        Intake/Output Summary (Last 24 hours) at 05/04/2020 1009 Last data filed at 05/03/2020  2300 Gross per 24 hour  Intake 1418.31 ml  Output 700 ml  Net 718.31 ml   Filed Weights   05/03/20 0143  Weight: 55.4 kg     Examination:      Physical Exam:   General:   Confused at baseline, drowsy this morning  HEENT:  Normocephalic, PERRL, otherwise with in Normal limits   Neuro:  CNII-XII intact. , normal motor and sensation, reflexes intact   Lungs:   Clear to auscultation BL, Respirations unlabored, no wheezes / crackles  Cardio:    S1/S2, RRR, No murmure, No Rubs or Gallops   Abdomen:   Soft, non-tender, bowel sounds active all four quadrants,  no guarding or peritoneal signs.  Muscular skeletal:   Limited range of motion at hip  Limited exam - in bed, minimal movement in bed of lower extremities moving upper extremities,  2+ pulses,  symmetric, No pitting edema  Skin:  Dry, warm to touch, negative for any Rashes,  Wounds: Please see nursing documentation            LABs:  CBC Latest Ref Rng & Units 05/03/2020 05/02/2020 12/08/2019  WBC 4.0 - 10.5 K/uL 16.0(H) 10.6(H) 10.1  Hemoglobin 12.0 - 15.0 g/dL 13.7 13.1 12.2  Hematocrit 36.0 - 46.0 % 43.5 41.8 39.3  Platelets 150 - 400 K/uL 170 189 126(L)   CMP Latest Ref Rng & Units 05/03/2020 05/02/2020 12/08/2019  Glucose 70 - 99 mg/dL 156(H) 165(H) 91  BUN 8 - 23 mg/dL 29(H) 31(H) 14  Creatinine 0.44 - 1.00 mg/dL 1.04(H) 1.19(H) 0.98  Sodium 135 - 145 mmol/L 140  142 141  Potassium 3.5 - 5.1 mmol/L 4.2 4.3 3.8  Chloride 98 - 111 mmol/L 104 104 104  CO2 22 - 32 mmol/L 22 28 24   Calcium 8.9 - 10.3 mg/dL 7.8(L) 7.7(L) 8.1(L)  Total Protein 6.5 - 8.1 g/dL 7.0 6.8 -  Total Bilirubin 0.3 - 1.2 mg/dL 1.0 0.7 -  Alkaline Phos 38 - 126 U/L 107 101 -  AST 15 - 41 U/L 41 36 -  ALT 0 - 44 U/L 35 32 -       Micro Results Recent Results (from the past 240 hour(s))  SARS CORONAVIRUS 2 (TAT 6-24 HRS) Nasopharyngeal Nasopharyngeal Swab     Status: None   Collection Time: 05/02/20 10:33 PM   Specimen: Nasopharyngeal Swab  Result Value Ref Range Status   SARS Coronavirus 2 NEGATIVE NEGATIVE Final    Comment: (NOTE) SARS-CoV-2 target nucleic acids are NOT DETECTED.  The SARS-CoV-2 RNA is generally detectable in upper and lower respiratory specimens during the acute phase of infection. Negative results do not preclude SARS-CoV-2 infection, do not rule out co-infections with other pathogens, and should not be used as the sole basis for treatment or other patient management decisions. Negative results must be combined with clinical observations, patient history, and epidemiological information. The expected result is Negative.  Fact Sheet for Patients: SugarRoll.be  Fact Sheet for Healthcare Providers: https://www.woods-mathews.com/  This test is not yet approved or cleared by the [place] FDA and  has been authorized for detection and/or diagnosis of SARS-CoV-2 by FDA under an Emergency Use Authorization (EUA). This EUA will remain  in effect (meaning this test can be used) for the duration of the COVID-19 declaration under Se ction 564(b)(1) of the Act, 21 U.S.C. section 360bbb-3(b)(1), unless the authorization is terminated or revoked sooner.  Performed at [facility], [place] [address], [place], [place] [address]  Resp Panel by RT-PCR (Flu A&B, Covid) Nasopharyngeal Swab     Status: None    Collection Time: 05/03/20 12:44 AM   Specimen: Nasopharyngeal Swab; Nasopharyngeal(NP) swabs in vial transport medium  Result Value Ref Range Status   SARS Coronavirus 2 by RT PCR NEGATIVE NEGATIVE Final    Comment: (NOTE) SARS-CoV-2 target nucleic acids are NOT DETECTED.  The SARS-CoV-2 RNA is generally detectable in upper respiratory specimens during the acute phase of infection. The lowest concentration of SARS-CoV-2 viral copies this assay can detect is 138 copies/mL. A negative result does not preclude SARS-Cov-2 infection and should not be used as the sole basis for treatment or other patient management decisions. A negative result may occur with  improper specimen collection/handling, submission of specimen other than nasopharyngeal swab, presence of viral mutation(s) within the areas targeted by this assay, and inadequate number of viral copies(<138 copies/mL). A negative result must be combined with clinical observations, patient history, and epidemiological information. The expected result is Negative.  Fact Sheet for Patients:  EntrepreneurPulse.com.au  Fact Sheet for Healthcare Providers:  IncredibleEmployment.be  This test is no t yet approved or cleared by the [place] FDA and  has been authorized for detection and/or diagnosis of SARS-CoV-2 by FDA under an Emergency Use Authorization (EUA). This EUA will remain  in effect (meaning this test can be used) for the duration of the COVID-19 declaration under Section 564(b)(1) of the Act, 21 U.S.C.section 360bbb-3(b)(1), unless the authorization is terminated  or revoked sooner.       Influenza A by PCR NEGATIVE NEGATIVE Final   Influenza B by PCR NEGATIVE NEGATIVE Final    Comment: (NOTE) The Xpert Xpress SARS-CoV-2/FLU/RSV plus assay is intended as an aid in the diagnosis of influenza from Nasopharyngeal swab specimens and should not be used as a sole basis for treatment.  Nasal washings and aspirates are unacceptable for Xpert Xpress SARS-CoV-2/FLU/RSV testing.  Fact Sheet for Patients: EntrepreneurPulse.com.au  Fact Sheet for Healthcare Providers: IncredibleEmployment.be  This test is not yet approved or cleared by the [place] FDA and has been authorized for detection and/or diagnosis of SARS-CoV-2 by FDA under an Emergency Use Authorization (EUA). This EUA will remain in effect (meaning this test can be used) for the duration of the COVID-19 declaration under Section 564(b)(1) of the Act, 21 U.S.C. section 360bbb-3(b)(1), unless the authorization is terminated or revoked.  Performed at [facility], [place] [address] [place], [place] [address]   Culture, blood (routine x 2)     Status: None (Preliminary result)   Collection Time: 05/03/20  5:28 AM   Specimen: BLOOD LEFT HAND  Result Value Ref Range Status   Specimen Description   Final    BLOOD LEFT HAND Performed at [place] [address], [place], [place] [address]    Special Requests   Final    BOTTLES DRAWN AEROBIC ONLY Blood Culture adequate volume Performed at [place] [address] [place], [place] [address]    Culture   Final    NO GROWTH 1 DAY Performed at [facility], [place] [address] [place], [place] [address]    Report Status PENDING  Incomplete  Culture, blood (routine x 2)     Status: None (Preliminary result)   Collection Time: 05/03/20  5:28 AM   Specimen: BLOOD RIGHT HAND  Result Value Ref Range Status   Specimen Description   Final    BLOOD RIGHT HAND Performed  at [facility], [place] [address], [place], [place] [address]    Special Requests   Final    BOTTLES DRAWN AEROBIC ONLY Blood Culture adequate volume Performed at [place] [address] [place], [place] [address]    Culture   Final    NO GROWTH 1  DAY Performed at [facility], [place] [address] [place], [place] [address]    Report Status PENDING  Incomplete    Radiology Reports DG Chest 1 View  Result Date: 05/02/2020 CLINICAL DATA:  Fall EXAM: CHEST  1 VIEW COMPARISON:  12/05/2019 FINDINGS: Patchy focus of airspace disease in the right mid lung. Suspected consolidation left base with possible small effusion. Stable cardiomediastinal silhouette. No pneumothorax IMPRESSION: Patchy focus of airspace disease in the right mid lung with suspected patchy consolidation left base, possible pneumonia. Probable small left effusion. Electronically Signed   By: Donavan Foil M.D.   On: 05/02/2020 22:13   DG Hip Unilat W or Wo Pelvis 2-3 Views Right  Result Date: 05/02/2020 CLINICAL DATA:  Fall EXAM: DG HIP (WITH OR WITHOUT PELVIS) 2-3V RIGHT COMPARISON:  None. FINDINGS: SI joints are non widened. Pubic symphysis appears intact. Acute nondisplaced right femoral neck fracture. No femoral head dislocation. IMPRESSION: Acute nondisplaced right femoral neck fracture. Electronically Signed   By: Donavan Foil M.D.   On: 05/02/2020 22:12    SIGNED: Deatra James, MD, FHM. [facility],  Pager (please use [email or web address] to page/text) Please use Epic Secure Chat for non-urgent communication (7AM-7PM)  If 7PM-7AM, please contact night-coverage [email or web address], 05/04/2020, 10:09 AM

## 2020-05-04 NOTE — Anesthesia Preprocedure Evaluation (Addendum)
Anesthesia Evaluation  Patient identified by MRN, date of birth, ID band Patient awake    Reviewed: Allergy & Precautions, H&P , NPO status , Patient's Chart, lab work & pertinent test results, reviewed documented beta blocker date and time   Airway Mallampati: II  TM Distance: >3 FB Neck ROM: full    Dental no notable dental hx.    Pulmonary    Pulmonary exam normal breath sounds clear to auscultation       Cardiovascular Exercise Tolerance: Good hypertension, Pt. on medications negative cardio ROS   Rhythm:regular Rate:Normal  ECHO 22  1. Left ventricular ejection fraction, by estimation, is 55 to 60%. The left ventricle has normal function. The left ventricle has no regional wall motion abnormalities. There is severe asymmetric left ventricular hypertrophy of the basal-septal segment. Left ventricular diastolic parameters are consistent with Grade II diastolic dysfunction (pseudonormalization). Elevated left atrial pressure. 2. Right ventricular systolic function is mildly reduced. The right ventricular size is mildly enlarged. There is moderately elevated pulmonary artery systolic pressure. The estimated right ventricular systolic pressure is 52.8 mmHg. 3. Right atrial size was mildly dilated. 4. The mitral valve is degenerative. Trivial mitral valve regurgitation. No evidence of mitral stenosis. Moderate mitral annular calcification. 5. Tricuspid valve regurgitation is moderate. 6. The aortic valve is tricuspid. Aortic valve regurgitation is mild. Mild aortic valve sclerosis is present, with no evidence of aortic valve stenosis. 7. Aortic dilatation noted. There is mild dilatation of the ascending aorta, measuring 37 mm.   Neuro/Psych PSYCHIATRIC DISORDERS Anxiety Dementia negative neurological ROS     GI/Hepatic negative GI ROS, Neg liver ROS,   Endo/Other  Hypothyroidism   Renal/GU negative Renal ROS  negative  genitourinary   Musculoskeletal  (+) Arthritis , Osteoarthritis,    Abdominal   Peds  Hematology negative hematology ROS (+)   Anesthesia Other Findings   Reproductive/Obstetrics negative OB ROS                            Anesthesia Physical Anesthesia Plan  ASA: IV and emergent  Anesthesia Plan: General   Post-op Pain Management:    Induction:   PONV Risk Score and Plan: 3  Airway Management Planned: LMA and Oral ETT  Additional Equipment:   Intra-op Plan:   Post-operative Plan:   Informed Consent: I have reviewed the patients History and Physical, chart, labs and discussed the procedure including the risks, benefits and alternatives for the proposed anesthesia with the patient or authorized representative who has indicated his/her understanding and acceptance.   Patient has DNR.  Discussed DNR with power of attorney and Continue DNR.   Dental Advisory Given  Plan Discussed with: CRNA and Anesthesiologist  Anesthesia Plan Comments: (I had an extended discussion with her daughter that has power of attorney.  Her wishes are for no chest compressions with only ventilatory support and cardiac drugs if indicated.  )       Anesthesia Quick Evaluation

## 2020-05-05 ENCOUNTER — Encounter (HOSPITAL_COMMUNITY): Payer: Self-pay | Admitting: Orthopedic Surgery

## 2020-05-05 LAB — CBC
HCT: 36.6 % (ref 36.0–46.0)
Hemoglobin: 11.7 g/dL — ABNORMAL LOW (ref 12.0–15.0)
MCH: 31 pg (ref 26.0–34.0)
MCHC: 32 g/dL (ref 30.0–36.0)
MCV: 96.8 fL (ref 80.0–100.0)
Platelets: 123 10*3/uL — ABNORMAL LOW (ref 150–400)
RBC: 3.78 MIL/uL — ABNORMAL LOW (ref 3.87–5.11)
RDW: 15.4 % (ref 11.5–15.5)
WBC: 12.8 10*3/uL — ABNORMAL HIGH (ref 4.0–10.5)
nRBC: 0 % (ref 0.0–0.2)

## 2020-05-05 LAB — BASIC METABOLIC PANEL
Anion gap: 12 (ref 5–15)
BUN: 25 mg/dL — ABNORMAL HIGH (ref 8–23)
CO2: 25 mmol/L (ref 22–32)
Calcium: 7.1 mg/dL — ABNORMAL LOW (ref 8.9–10.3)
Chloride: 105 mmol/L (ref 98–111)
Creatinine, Ser: 0.98 mg/dL (ref 0.44–1.00)
GFR, Estimated: 53 mL/min — ABNORMAL LOW (ref >60)
Glucose, Bld: 147 mg/dL — ABNORMAL HIGH (ref 70–99)
Potassium: 3.7 mmol/L (ref 3.5–5.1)
Sodium: 142 mmol/L (ref 135–145)

## 2020-05-05 LAB — GLUCOSE, CAPILLARY: Glucose-Capillary: 101 mg/dL — ABNORMAL HIGH (ref 70–99)

## 2020-05-05 LAB — MAGNESIUM: Magnesium: 1.8 mg/dL (ref 1.7–2.4)

## 2020-05-05 MED ORDER — ALBUTEROL SULFATE (2.5 MG/3ML) 0.083% IN NEBU
2.5000 mg | INHALATION_SOLUTION | Freq: Once | RESPIRATORY_TRACT | Status: AC
  Administered 2020-05-05: 2.5 mg via RESPIRATORY_TRACT
  Filled 2020-05-05: qty 3

## 2020-05-05 MED ORDER — MORPHINE SULFATE (PF) 2 MG/ML IV SOLN
0.5000 mg | INTRAVENOUS | Status: DC | PRN
  Administered 2020-05-07 – 2020-05-08 (×3): 0.5 mg via INTRAVENOUS
  Filled 2020-05-05 (×3): qty 1

## 2020-05-05 MED ORDER — TRAMADOL HCL 50 MG PO TABS: 50.0000 mg | ORAL_TABLET | Freq: Four times a day (QID) | ORAL | 0 refills | Status: AC | PRN

## 2020-05-05 MED ORDER — FUROSEMIDE 10 MG/ML IJ SOLN
40.0000 mg | Freq: Once | INTRAMUSCULAR | Status: AC
  Administered 2020-05-05: 40 mg via INTRAVENOUS
  Filled 2020-05-05: qty 4

## 2020-05-05 MED ORDER — MAGNESIUM SULFATE 2 GM/50ML IV SOLN
2.0000 g | Freq: Once | INTRAVENOUS | Status: AC
  Administered 2020-05-05: 2 g via INTRAVENOUS
  Filled 2020-05-05: qty 50

## 2020-05-05 MED ORDER — ASPIRIN 81 MG PO TBEC: 81.0000 mg | DELAYED_RELEASE_TABLET | Freq: Two times a day (BID) | ORAL | 0 refills | Status: AC

## 2020-05-05 MED ORDER — GLYCOPYRROLATE 0.2 MG/ML IJ SOLN
0.4000 mg | Freq: Three times a day (TID) | INTRAMUSCULAR | Status: DC
  Administered 2020-05-05 – 2020-05-09 (×11): 0.4 mg via INTRAVENOUS
  Filled 2020-05-05 (×19): qty 2

## 2020-05-05 MED ORDER — ACETAMINOPHEN 500 MG PO TABS: 1000.0000 mg | ORAL_TABLET | Freq: Three times a day (TID) | ORAL | 0 refills | Status: AC

## 2020-05-05 MED ORDER — DOXYCYCLINE HYCLATE 100 MG PO TABS
100.0000 mg | ORAL_TABLET | Freq: Two times a day (BID) | ORAL | Status: DC
  Administered 2020-05-05 – 2020-05-10 (×11): 100 mg via ORAL
  Filled 2020-05-05 (×11): qty 1

## 2020-05-05 NOTE — NC FL2 (Signed)
[place] LEVEL OF CARE SCREENING TOOL     IDENTIFICATION  Patient Name: Lauren [name] Birthdate: 1924/05/05 Sex: female Admission Date (Current Location): 05/02/2020  [facility] [place] Number:  Herbalist and Address:  [facility],  [facility] [address] [place]      Provider Number: [id number]  Attending Physician Name and Address:  Deatra James, MD  Relative Name and Phone Number:  [name] NTZ [phone]    Current Level of Care: Hospital Recommended Level of Care: Memory [place] Prior Approval Number:    Date Approved/Denied:   PASRR Number: [id number]  Discharge Plan: SNF    Current Diagnoses: Patient Active Problem List   Diagnosis Date Noted  . Malnutrition of moderate degree 05/04/2020  . Fracture of femoral neck, right, closed ([place]) 05/03/2020  . Mixed hyperlipidemia 05/03/2020  . Acute respiratory failure with hypoxemia ([place]) 12/05/2019  . Acute hypoxemic respiratory failure ([place]) 12/05/2019  . Pneumonia of both lungs due to infectious organism 12/02/2019  . CAP (community acquired pneumonia) 12/01/2019  . Respiratory failure ([place]) 12/01/2019  . Hypotension 12/01/2019  . Chronic kidney disease, stage 3a ([place]) 09/01/2017  . Osteoporosis 10/21/2016  . Weight gain 04/16/2016  . Hypocalcemia 04/16/2016  . Hypothyroidism 04/16/2016  . Edema of right foot 04/16/2016  . Dementia without behavioral disturbance ([place]) 10/31/2015  . 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 Loss of weight 03/24/2014  . Essential hypertension   . High cholesterol   . Anxiety     Orientation RESPIRATION BLADDER Height & Weight     Self,Place  Normal Continent Weight: 55.4 kg Height:  5\' 8"  (172.7 cm)  BEHAVIORAL SYMPTOMS/MOOD NEUROLOGICAL BOWEL NUTRITION STATUS      Continent Diet (Regular)  AMBULATORY STATUS COMMUNICATION OF NEEDS Skin   Limited Assist Verbally Surgical wounds (R hip  Hemiarthroplasty-surgical incision site.)                       Personal Care Assistance Level of Assistance  Bathing,Feeding,Dressing Bathing Assistance: Limited assistance Feeding assistance: Limited assistance Dressing Assistance: Limited assistance     Functional Limitations Info  Sight,Hearing,Speech Sight Info: Impaired (eyeglasses)   Speech Info: Adequate    SPECIAL CARE FACTORS FREQUENCY  PT (By licensed PT),OT (By licensed OT)     PT Frequency: 5x week OT Frequency: 5x week            Contractures Contractures Info: Not present    Additional Factors Info  Code Status,Allergies,Psychotropic Code Status Info:  (DNR) Allergies Info:  (Aricept;aspirin) Psychotropic Info:  (namenda;remeron,wellbutrin)         Current Medications (05/05/2020):  This is the current hospital active medication list Current Facility-Administered Medications  Medication Dose Route Frequency Provider Last Rate Last Admin  . acetaminophen (TYLENOL) tablet 500 mg  500 mg Oral Q6H PRN Babish, Matthew, PA-C      . aspirin EC tablet 81 mg  81 mg Oral BID Danae Orleans, PA-C   81 mg at 05/05/20 0729  . atorvastatin (LIPITOR) tablet 5 mg  5 mg Oral Daily Danae Orleans, PA-C   5 mg at 05/05/20 0940  . bisacodyl (DULCOLAX) suppository 10 mg  10 mg Rectal Daily PRN Babish, Matthew, PA-C      . buPROPion (WELLBUTRIN XL) 24 hr tablet 150 mg  150 mg Oral Daily Danae Orleans, PA-C   150 mg at 05/05/20 5993  . cefTRIAXone (ROCEPHIN) 1 g in sodium chloride  0.9 % 100 mL IVPB  1 g Intravenous Q24H Danae Orleans, PA-C   Stopped at 05/05/20 0028  . chlorhexidine (PERIDEX) 0.12 % solution 15 mL  15 mL Mouth Rinse BID Danae Orleans, PA-C   15 mL at 05/05/20 0936  . Chlorhexidine Gluconate Cloth 2 % PADS 6 each  6 each Topical Daily Deatra James, MD   6 each at 05/05/20 0940  . docusate sodium (COLACE) capsule 100 mg  100 mg Oral BID Danae Orleans, PA-C   100 mg at 05/05/20 7096  .  doxycycline (VIBRA-TABS) tablet 100 mg  100 mg Oral Q12H Shahmehdi, Seyed A, MD   100 mg at 05/05/20 0939  . feeding supplement (ENSURE ENLIVE / ENSURE PLUS) liquid 237 mL  237 mL Oral BID BM Danae Orleans, PA-C   237 mL at 05/05/20 0940  . ferrous sulfate tablet 325 mg  325 mg Oral TID PC Babish, Matthew, PA-C      . glycopyrrolate (ROBINUL) injection 0.4 mg  0.4 mg Intravenous TID Shahmehdi, Seyed A, MD      . hydrALAZINE (APRESOLINE) injection 10 mg  10 mg Intravenous Q4H PRN Danae Orleans, PA-C   10 mg at 05/05/20 0706  . lactated ringers infusion   Intravenous Continuous Deatra James, MD 75 mL/hr at 05/05/20 0545 New Bag at 05/05/20 0545  . levothyroxine (SYNTHROID) tablet 100 mcg  100 mcg Oral Q0600 Danae Orleans, PA-C   100 mcg at 05/05/20 0559  . magnesium sulfate IVPB 2 g 50 mL  2 g Intravenous Once Skipper Cliche A, MD 50 mL/hr at 05/05/20 1314 2 g at 05/05/20 1314  . MEDLINE mouth rinse  15 mL Mouth Rinse BID Danae Orleans, PA-C   15 mL at 05/04/20 1016  . memantine (NAMENDA XR) 24 hr capsule 14 mg  14 mg Oral Daily Danae Orleans, PA-C   14 mg at 05/05/20 2836  . menthol-cetylpyridinium (CEPACOL) lozenge 3 mg  1 lozenge Oral PRN Danae Orleans, PA-C       Or  . phenol (CHLORASEPTIC) mouth spray 1 spray  1 spray Mouth/Throat PRN Babish, [place]man [place], PA-C      . methocarbamol (ROBAXIN) tablet 500 mg  500 mg Oral Q6H PRN Danae Orleans, PA-C   500 mg at 05/05/20 1203   Or  . methocarbamol (ROBAXIN) 500 mg in dextrose 5 % 50 mL IVPB  500 mg Intravenous Q6H PRN Babish, Matthew, PA-C      . metoCLOPramide (REGLAN) tablet 5-10 mg  5-10 mg Oral Q8H PRN Danae Orleans, PA-C       Or  . metoCLOPramide (REGLAN) injection 5-10 mg  5-10 mg Intravenous Q8H PRN Babish, Matthew, PA-C      . metoprolol tartrate (LOPRESSOR) tablet 50 mg  50 mg Oral BID Danae Orleans, PA-C   50 mg at 05/05/20 6294  . mirtazapine (REMERON) tablet 7.5 mg  7.5 mg Oral QHS Danae Orleans, PA-C   7.5 mg at  05/04/20 2254  . morphine 2 MG/ML injection 0.5 mg  0.5 mg Intravenous Q2H PRN Shahmehdi, Seyed A, MD      . morphine 2 MG/ML injection 0.5-1 mg  0.5-1 mg Intravenous Q2H PRN Danae Orleans, PA-C   1 mg at 05/05/20 0729  . ondansetron (ZOFRAN) tablet 4 mg  4 mg Oral Q6H PRN Danae Orleans, PA-C       Or  . ondansetron (ZOFRAN) injection 4 mg  4 mg Intravenous Q6H PRN Danae Orleans, PA-C      .  polyethylene glycol (MIRALAX / GLYCOLAX) packet 17 g  17 g Oral Daily PRN Babish, Rodman Key, PA-C      . traMADol Veatrice [place]bon) tablet 50-100 mg  50-100 mg Oral Q6H PRN Danae Orleans, PA-C   100 mg at 05/05/20 7939     Discharge Medications: Please see discharge summary for a list of discharge medications.  Relevant Imaging Results:  Relevant Lab Results:   Additional Information SSN [place] [id number]  Leane Loring, Juliann Pulse, RN

## 2020-05-05 NOTE — Progress Notes (Signed)
Chaplain engaged in follow-up visit with Lauren [name] and her daughter, Lauren [name].  Chaplain spent time with Lauren [name] discussing her mom's health and life, her role as a caregiver, and what Lauren [name] desires for the future.  Chaplain offered ministries of listening and presence.  Chaplain was able to offer support as Lauren [name] spoke candidly about what it means to be a caregiver.  Chaplain affirmed [name]'s role and the ways in which she has honored her mother.    Chaplain is available to continue to provide support.    05/05/20 1600  Clinical Encounter Type  Visited With Patient and family together  Visit Type Follow-up

## 2020-05-05 NOTE — TOC Progression Note (Signed)
Transition of Care [facility]) - Progression Note    Patient Details  Name: Lauren [name] MRN: [id number] Date of Birth: 08-Apr-1924  Transition of Care [facility]) CM/SW Contact  Jozalyn Baglio, Juliann Pulse, RN Phone Number: 05/05/2020, 1:34 PM  Clinical Narrative: Faxed fl2,H&P to [facility] fax#'s [phone]-KDPT vm w/Charity rep tel#[phone]-await call back-informed of current status-Palliative care to eval.      Expected Discharge Plan: Memory Care ([facility]) Barriers to Discharge: Continued Medical Work up  Expected Discharge Plan and Services Expected Discharge Plan: Memory Care ([place])       Living arrangements for the past 2 months: [place] (Memory care)                                       Social Determinants of Health (SDOH) Interventions    Readmission Risk Interventions No flowsheet data found.

## 2020-05-05 NOTE — Progress Notes (Signed)
PROGRESS NOTE    Patient: Lauren [name]                            PCP: Patient, No Pcp Per                    DOB: 07-Dec-1924            DOA: 05/02/2020 WCH:[id number]             DOS: 05/05/2020, 9:49 AM   LOS: 2 days   Date of Service: The patient was seen and examined on 05/05/2020  Subjective:   Postop day #1 status post right hip hemiarthroplasty Tolerated procedure well.  Confused, hypertensive, lethargic this morning Over night per nursing staff was persistently hypertensive   Brief Narrative:   87 year old female with past medical history of dementia, hyperlipidemia, hypothyroidism, hyper tension and chronic kidney disease stage IIIa who presents to [facility] long hospital emergency department from [place] facility via EMS after patient fell out of bed. Patient is unable to provide a history due to advanced dementia.  Upon evaluation in the emergency department, right hip x-ray revealed a acute nondisplaced right femoral neck fracture.  Case was discussed with Dr. Alvan Dame with orthopedic surgery Right hip hemiarthroplasty 05/04/2020  -     Assessment & Plan:   Principal Problem:   Fracture of femoral neck, right, closed ([place]) Active Problems:   Essential hypertension   Dementia without behavioral disturbance ([place])   Hypothyroidism   Chronic kidney disease, stage 3a ([place])   Pneumonia of both lungs due to infectious organism   Mixed hyperlipidemia   Malnutrition of moderate degree  Principal Problem:    Fracture of femoral neck, right, closed ([place])  Patient suffered a fracture of the right femoral neck after a fall on evening of 05/02/2020  Postop day #1  Status post right hip hemiarthroplasty 06/01/2020  fall apparently seem to be mechanical in nature. ,  No evidence of LOC, syncope, or meds    Continue with pain management  Discussed with patient daughter that this is a palliative procedure, not anticipating much improvement post  procedure with exception of better pain management-comfort  Daughter expressed understanding and agreed with current plan.  Active Problems:    Pneumonia of both lungs due to infectious organism   Multifocal pneumonia noted on chest x-ray during his presentation similar to chest imaging performed in September 2021 --remains clinically stable but a mild leukocytosis  We will continue empiric IV antibiotics for now Rocephin and doxycycline  Mildly hypertensive otherwise afebrile   SARS-CoV-2 negative,   Procalcitonin <0.10  CRP 2.3 Echocardiogram  >> prelim report reviewed    Essential hypertension We will continue as needed hydralazine, resume home medication of metoprolol -If no response will add as needed labetalol      Dementia without behavioral disturbance (HCC)   Patient exhibiting significant agitation and confusion in the setting of advanced dementia  Attempting to manage patient's pain adequately  Attempting to avoid additional painful stimuli as much as possible  Fall precautions  Frequent redirection  Currently worsening mentation likely due to pain, narcotics    Hypothyroidism   Continue home regimen of levothyroxine    Chronic kidney disease, stage 3a (HCC)   Creatinine near baseline, 1.19, 1.04, 0.98  Strict input and output monitoring  Avoiding nephrotoxic agents if at all possible  Monitoring renal function and electrolytes with serial chemistries  Mixed hyperlipidemia   Continue home regimen of statin therapy  Goal of care: Discussed with daughter in detail the procedure is more palliative in nature to improve pain and comfort. She confirmed DNR/DNI status Agreed to palliative care consultation. If no improvement in this hospitalization family is agreeable to comfort care measures, possible  hospice  ------------------------------------------------------------------------------------------------------------------------------------ Nutritional status:  The patient's BMI is: Body mass index is 18.57 kg/m. I agree with the assessment and plan as outlined --   ------------------------------------------------------------------------------------------------------------------------------------ Cultures; Blood Cultures x 2 >> NGT Urine Culture  >>> NGT    Antimicrobials:  IV antibiotics for now Rocephin and doxycycline  Consultants: Orthopedic team palliative care team   ------------------------------------------------------------------------------------------------------------------------------------  DVT prophylaxis:  SCD/Compression stockings Code Status:   Code Status: DNR  Family Communication: Discussed with daughter in detail- they expressed understanding and agreement of above. -Advance care planning reviewed.   Admission status:   Status is: Inpatient  The patient remains OBS appropriate and will d/c before 2 midnights.  Dispo: The patient is from: SNF              Anticipated d/c is to: SNF              Anticipated d/c date is: 2 days              Patient currently is not medically stable to d/c.   Difficult to place patient No  Level of care: Telemetry   Procedures:   No admission procedures for hospital encounter.     Antimicrobials:  Anti-infectives (From admission, onward)   Start     Dose/Rate Route Frequency Ordered Stop   05/05/20 1000  doxycycline (VIBRA-TABS) tablet 100 mg        100 mg Oral Every 12 hours 05/05/20 0902     05/04/20 2300  ceFAZolin (ANCEF) IVPB 2g/100 mL premix        2 g 200 mL/hr over 30 Minutes Intravenous Every 6 hours 05/04/20 2200 05/05/20 0836   05/04/20 1630  ceFAZolin (ANCEF) IVPB 2g/100 mL premix        2 g 200 mL/hr over 30 Minutes Intravenous On call to O.R. 05/04/20 1622 05/04/20 1750   05/03/20 2200   cefTRIAXone (ROCEPHIN) 2 g in sodium chloride 0.9 % 100 mL IVPB  Status:  Discontinued        2 g 200 mL/hr over 30 Minutes Intravenous Every 24 hours 05/03/20 0136 05/03/20 0758   05/03/20 2200  cefTRIAXone (ROCEPHIN) 1 g in sodium chloride 0.9 % 100 mL IVPB        1 g 200 mL/hr over 30 Minutes Intravenous Every 24 hours 05/03/20 0800     05/03/20 1000  doxycycline (VIBRAMYCIN) 100 mg in sodium chloride 0.9 % 250 mL IVPB  Status:  Discontinued        100 mg 125 mL/hr over 120 Minutes Intravenous Every 12 hours 05/03/20 0136 05/03/20 0758   05/03/20 1000  doxycycline (VIBRAMYCIN) 100 mg in sodium chloride 0.9 % 250 mL IVPB  Status:  Discontinued        100 mg 125 mL/hr over 120 Minutes Intravenous Every 12 hours 05/03/20 0800 05/05/20 0902   05/02/20 2315  cefTRIAXone (ROCEPHIN) 1 g in sodium chloride 0.9 % 100 mL IVPB        1 g 200 mL/hr over 30 Minutes Intravenous  Once 05/02/20 2305 05/03/20 0054   05/02/20 2315  doxycycline (VIBRA-TABS) tablet 100 mg        100  mg Oral  Once 05/02/20 2305 05/02/20 2315       Medication:  . aspirin EC  81 mg Oral BID  . atorvastatin  5 mg Oral Daily  . buPROPion  150 mg Oral Daily  . chlorhexidine  15 mL Mouth Rinse BID  . Chlorhexidine Gluconate Cloth  6 each Topical Daily  . docusate sodium  100 mg Oral BID  . doxycycline  100 mg Oral Q12H  . feeding supplement  237 mL Oral BID BM  . ferrous sulfate  325 mg Oral TID PC  . levothyroxine  100 mcg Oral Q0600  . mouth rinse  15 mL Mouth Rinse BID  . memantine  14 mg Oral Daily  . metoprolol tartrate  50 mg Oral BID  . mirtazapine  7.5 mg Oral QHS    acetaminophen, bisacodyl, hydrALAZINE, menthol-cetylpyridinium **OR** phenol, methocarbamol **OR** methocarbamol (ROBAXIN) IV, metoCLOPramide **OR** metoCLOPramide (REGLAN) injection, morphine injection, ondansetron **OR** ondansetron (ZOFRAN) IV, polyethylene glycol, traMADol   Objective:   Vitals:   05/05/20 0240 05/05/20 0400 05/05/20 0706  05/05/20 0931  BP: (!) 159/75 (!) 162/83 (!) 171/72 (!) 150/75  Pulse: 82 83  94  Resp:  20    Temp:  98.8 F (37.1 C)  98 F (36.7 C)  TempSrc:  Oral  Oral  SpO2:  99%  93%  Weight:      Height:        Intake/Output Summary (Last 24 hours) at 05/05/2020 0949 Last data filed at 05/05/2020 3762 Gross per 24 hour  Intake 3174.71 ml  Output 1000 ml  Net 2174.71 ml   Filed Weights   05/03/20 0143  Weight: 55.4 kg     Examination:        Physical Exam:   General:   Lethargic, confused  HEENT:  Normocephalic, PERRL, otherwise with in Normal limits   Neuro:   Limited exam-CNII-XII intact. , normal motor and sensation,    Lungs:   Clear to auscultation BL, Respirations unlabored, no wheezes / crackles  Cardio:    S1/S2, RRR, No murmure, No Rubs or Gallops   Abdomen:   Soft, non-tender, bowel sounds active all four quadrants,  no guarding or peritoneal signs.  Muscular skeletal:  Limited exam - in bed, able to move all 4 extremities,  2+ pulses,  symmetric, No pitting edema  Skin:  Dry, warm to touch, negative for any Rashes, right hip surgical wound clean negative erythema edema mild tenderness  Wounds: Please see nursing documentation -hip surgical wound               LABs:  CBC Latest Ref Rng & Units 05/05/2020 05/03/2020 05/02/2020  WBC 4.0 - 10.5 K/uL 12.8(H) 16.0(H) 10.6(H)  Hemoglobin 12.0 - 15.0 g/dL 11.7(L) 13.7 13.1  Hematocrit 36.0 - 46.0 % 36.6 43.5 41.8  Platelets 150 - 400 K/uL 123(L) 170 189   CMP Latest Ref Rng & Units 05/05/2020 05/03/2020 05/02/2020  Glucose 70 - 99 mg/dL 147(H) 156(H) 165(H)  BUN 8 - 23 mg/dL 25(H) 29(H) 31(H)  Creatinine 0.44 - 1.00 mg/dL 0.98 1.04(H) 1.19(H)  Sodium 135 - 145 mmol/L 142 140 142  Potassium 3.5 - 5.1 mmol/L 3.7 4.2 4.3  Chloride 98 - 111 mmol/L 105 104 104  CO2 22 - 32 mmol/L 25 22 28   Calcium 8.9 - 10.3 mg/dL 7.1(L) 7.8(L) 7.7(L)  Total Protein 6.5 - 8.1 g/dL - 7.0 6.8  Total Bilirubin 0.3 - 1.2 mg/dL - 1.0  0.7  Alkaline  Phos 38 - 126 U/L - 107 101  AST 15 - 41 U/L - 41 36  ALT 0 - 44 U/L - 35 32       Micro Results Recent Results (from the past 240 hour(s))  SARS CORONAVIRUS 2 (TAT 6-24 HRS) Nasopharyngeal Nasopharyngeal Swab     Status: None   Collection Time: 05/02/20 10:33 PM   Specimen: Nasopharyngeal Swab  Result Value Ref Range Status   SARS Coronavirus 2 NEGATIVE NEGATIVE Final    Comment: (NOTE) SARS-CoV-2 target nucleic acids are NOT DETECTED.  The SARS-CoV-2 RNA is generally detectable in upper and lower respiratory specimens during the acute phase of infection. Negative results do not preclude SARS-CoV-2 infection, do not rule out co-infections with other pathogens, and should not be used as the sole basis for treatment or other patient management decisions. Negative results must be combined with clinical observations, patient history, and epidemiological information. The expected result is Negative.  Fact Sheet for Patients: SugarRoll.be  Fact Sheet for Healthcare Providers: https://www.woods-mathews.com/  This test is not yet approved or cleared by the [place] FDA and  has been authorized for detection and/or diagnosis of SARS-CoV-2 by FDA under an Emergency Use Authorization (EUA). This EUA will remain  in effect (meaning this test can be used) for the duration of the COVID-19 declaration under Se ction 564(b)(1) of the Act, 21 U.S.C. section 360bbb-3(b)(1), unless the authorization is terminated or revoked sooner.  Performed at [facility], [place] [address], [place], [place] [address]   Resp Panel by RT-PCR (Flu A&B, Covid) Nasopharyngeal Swab     Status: None   Collection Time: 05/03/20 12:44 AM   Specimen: Nasopharyngeal Swab; Nasopharyngeal(NP) swabs in vial transport medium  Result Value Ref Range Status   SARS Coronavirus 2 by RT PCR NEGATIVE NEGATIVE Final    Comment: (NOTE) SARS-CoV-2 target  nucleic acids are NOT DETECTED.  The SARS-CoV-2 RNA is generally detectable in upper respiratory specimens during the acute phase of infection. The lowest concentration of SARS-CoV-2 viral copies this assay can detect is 138 copies/mL. A negative result does not preclude SARS-Cov-2 infection and should not be used as the sole basis for treatment or other patient management decisions. A negative result may occur with  improper specimen collection/handling, submission of specimen other than nasopharyngeal swab, presence of viral mutation(s) within the areas targeted by this assay, and inadequate number of viral copies(<138 copies/mL). A negative result must be combined with clinical observations, patient history, and epidemiological information. The expected result is Negative.  Fact Sheet for Patients:  EntrepreneurPulse.com.au  Fact Sheet for Healthcare Providers:  IncredibleEmployment.be  This test is no t yet approved or cleared by the [place] FDA and  has been authorized for detection and/or diagnosis of SARS-CoV-2 by FDA under an Emergency Use Authorization (EUA). This EUA will remain  in effect (meaning this test can be used) for the duration of the COVID-19 declaration under Section 564(b)(1) of the Act, 21 U.S.C.section 360bbb-3(b)(1), unless the authorization is terminated  or revoked sooner.       Influenza A by PCR NEGATIVE NEGATIVE Final   Influenza B by PCR NEGATIVE NEGATIVE Final    Comment: (NOTE) The Xpert Xpress SARS-CoV-2/FLU/RSV plus assay is intended as an aid in the diagnosis of influenza from Nasopharyngeal swab specimens and should not be used as a sole basis for treatment. Nasal washings and aspirates are unacceptable for Xpert Xpress SARS-CoV-2/FLU/RSV testing.  Fact Sheet for Patients: EntrepreneurPulse.com.au  Fact  Sheet for Healthcare  Providers: IncredibleEmployment.be  This test is not yet approved or cleared by the [place] and has been authorized for detection and/or diagnosis of SARS-CoV-2 by FDA under an Emergency Use Authorization (EUA). This EUA will remain in effect (meaning this test can be used) for the duration of the COVID-19 declaration under Section 564(b)(1) of the Act, 21 U.S.C. section 360bbb-3(b)(1), unless the authorization is terminated or revoked.  Performed at [facility], [place] [address], [place], [place] [address]   Culture, blood (routine x 2)     Status: None (Preliminary result)   Collection Time: 05/03/20  5:28 AM   Specimen: BLOOD LEFT HAND  Result Value Ref Range Status   Specimen Description   Final    BLOOD LEFT HAND Performed at [facility] [address], [place], [place] [address]    Special Requests   Final    BOTTLES DRAWN AEROBIC ONLY Blood Culture adequate volume Performed at [facility] [address] [place], [place] [address]    Culture   Final    NO GROWTH 1 DAY Performed at [facility], [place] [address], [place], [place] [address]    Report Status PENDING  Incomplete  Culture, blood (routine x 2)     Status: None (Preliminary result)   Collection Time: 05/03/20  5:28 AM   Specimen: BLOOD RIGHT HAND  Result Value Ref Range Status   Specimen Description   Final    BLOOD RIGHT HAND Performed at [facility] [address], [place], [place] [address]    Special Requests   Final    BOTTLES DRAWN AEROBIC ONLY Blood Culture adequate volume Performed at [facility] [address], [place], [place] [address]    Culture   Final    NO GROWTH 1 DAY Performed at [facility], [place] [address] [place], [place] [address]    Report Status PENDING  Incomplete    Radiology Reports DG Chest 1 View  Result Date: 05/02/2020 CLINICAL  DATA:  Fall EXAM: CHEST  1 VIEW COMPARISON:  12/05/2019 FINDINGS: Patchy focus of airspace disease in the right mid lung. Suspected consolidation left base with possible small effusion. Stable cardiomediastinal silhouette. No pneumothorax IMPRESSION: Patchy focus of airspace disease in the right mid lung with suspected patchy consolidation left base, possible pneumonia. Probable small left effusion. Electronically Signed   By: Donavan Foil M.D.   On: 05/02/2020 22:13   DG Pelvis Portable  Result Date: 05/04/2020 CLINICAL DATA:  Status post hemiarthroplasty. EXAM: PORTABLE PELVIS 1-2 VIEWS COMPARISON:  Radiograph 05/02/2020 FINDINGS: Right hemiarthroplasty in expected alignment. There is no periprosthetic lucency or fracture. Recent postsurgical change includes air and edema in the soft tissues. IMPRESSION: Right hemiarthroplasty without immediate postoperative complication. Electronically Signed   By: Keith Rake M.D.   On: 05/04/2020 22:41   DG Hip Unilat W or Wo Pelvis 2-3 Views Right  Result Date: 05/02/2020 CLINICAL DATA:  Fall EXAM: DG HIP (WITH OR WITHOUT PELVIS) 2-3V RIGHT COMPARISON:  None. FINDINGS: SI joints are non widened. Pubic symphysis appears intact. Acute nondisplaced right femoral neck fracture. No femoral head dislocation. IMPRESSION: Acute nondisplaced right femoral neck fracture. Electronically Signed   By: Donavan Foil M.D.   On: 05/02/2020 22:12    SIGNED: Deatra James, MD, FHM. [facility],  Pager (please use [email or web address] to page/text) Please use Epic Secure Chat for non-urgent communication (7AM-7PM)  If 7PM-7AM, please contact night-coverage [email or web address], 05/05/2020, 9:49 AM

## 2020-05-05 NOTE — Progress Notes (Signed)
Notified provider on call about patient's high blood pressures and patient having 4 runs of V-Tach. Provider on call advised nurse to give PRN Hydralazine as ordered if patient's blood pressure continued to be greater than 160, but did not want to give anything in between to bottom patient's blood pressure.

## 2020-05-05 NOTE — Progress Notes (Signed)
PHARMACIST - PHYSICIAN COMMUNICATION DR:   Alyson Reedy CONCERNING: Antibiotic IV to Oral Route Change Policy  RECOMMENDATION: This patient is receiving doxycycline by the intravenous route.  Based on criteria approved by the Pharmacy and Therapeutics Committee, the antibiotic(s) is/are being converted to the equivalent oral dose form(s).   DESCRIPTION: These criteria include:  Patient being treated for a respiratory tract infection, urinary tract infection, cellulitis or clostridium difficile associated diarrhea if on metronidazole  The patient is not neutropenic and does not exhibit a GI malabsorption state  The patient is eating (either orally or via tube) and/or has been taking other orally administered medications for a least 24 hours  The patient is improving clinically and has a Tmax < 100.5  If you have questions about this conversion, please contact the Pharmacy Department  []   [phone] )  [facility] []   [phone] )  [facility] []   [phone] )  [facility] [x]   [phone] )  [facility] []   [phone] )  [place], PharmD, BCPS 05/05/2020 9:04 AM

## 2020-05-05 NOTE — Plan of Care (Signed)
  Problem: Coping: Goal: Level of anxiety will decrease Outcome: Progressing   Problem: Pain Managment: Goal: General experience of comfort will improve Outcome: Progressing   Problem: Safety: Goal: Ability to remain free from injury will improve Outcome: Progressing   Problem: Skin Integrity: Goal: Risk for impaired skin integrity will decrease Outcome: Progressing   Problem: Clinical Measurements: Goal: Ability to maintain a body temperature in the normal range will improve Outcome: Progressing   Problem: Respiratory: Goal: Ability to maintain adequate ventilation will improve Outcome: Progressing Goal: Ability to maintain a clear airway will improve Outcome: Progressing   Problem: Pain Management: Goal: Pain level will decrease Outcome: Progressing

## 2020-05-05 NOTE — Evaluation (Signed)
Physical Therapy Evaluation Patient Details Name: Lauren [name] MRN: [id number] DOB: 06/23/1924 Today's Date: 05/05/2020   History of Present Illness  Patient is a 87 year old female with past medical history of dementia, hyperlipidemia, hypothyroidism, hyper tension and chronic kidney disease stage IIIa who was admitted after fall out of bed at memory care facility.  Had R femoral neck fracture, now s/p R hip hemiarthroplasty.  Clinical Impression  Patient presents with decreased mobility due to limited activity tolerance, decreased balance, pain in the R hip and decreased safety awareness.  She may benefit from follow up HHPT at ALF/memory care facility depending on her prior level of mobility before the fall.  She does follow simple one step commands and seemed comfortable seated at EOB.  PT to continue while in acute care for progressing mobility as tolerated and decreasing burden of care.    Follow Up Recommendations Home health PT    Equipment Recommendations  None recommended by PT    Recommendations for Other Services       Precautions / Restrictions Precautions Precautions: Fall;Posterior Hip Precaution Comments: assuming posterior hip precautions as hemiarthroplasty, no orders Restrictions RLE Weight Bearing: Weight bearing as tolerated      Mobility  Bed Mobility Overal bed mobility: Needs Assistance Bed Mobility: Supine to Sit;Sit to Supine     Supine to sit: +2 for physical assistance;Total assist;HOB elevated Sit to supine: Total assist;+2 for physical assistance   General bed mobility comments: used pad under her to move shoulders and legs in one movement with +2 A to limit pain in R hip.  to supine again with assist for legs and trunk and to scoot up in bed,  positioned with rolled blanket on R side to prevent falling/leaning to R.    Transfers                 General transfer comment: NT due to limited activity tolerance/pain and  dementia  Ambulation/Gait                Stairs            Wheelchair Mobility    Modified Rankin (Stroke Patients Only)       Balance Overall balance assessment: Needs assistance   Sitting balance-Leahy Scale: Fair Sitting balance - Comments: can sit unsupported at EOB once positioned, but noting postural deficits and tic needing at least S for safety as fell out of bed at facility                                     Pertinent Vitals/Pain Pain Assessment: Faces Faces Pain Scale: Hurts even more Pain Location: R hip with moving toward EOB, but denies pain in sitting Pain Descriptors / Indicators: Grimacing;Guarding Pain Intervention(s): Monitored during session;Repositioned;Limited activity within patient's tolerance    Home Living Family/patient expects to be discharged to:: Assisted living                 Additional Comments: from [facility] memory care unit    Prior Function Level of Independence: Needs assistance   Gait / Transfers Assistance Needed: unknown prior functional level due to dementia  ADL's / Homemaking Assistance Needed: assist 2/2 dementia        Hand Dominance        Extremity/Trunk Assessment   Upper Extremity Assessment Upper Extremity Assessment: RUE deficits/detail RUE Deficits / Details: able to mirror and  lift L arm to command, but no attempts to move R arm, with passive movement she still did not initiate, but did not let the arm flop back on the bed RUE Coordination: decreased gross motor;decreased fine motor    Lower Extremity Assessment Lower Extremity Assessment: RLE deficits/detail RLE Deficits / Details: limited due to pain, noted able to wiggle toes to commands with increased time and multimodal cues, but no attempts to lift the leg    Cervical / Trunk Assessment Cervical / Trunk Assessment: Kyphotic;Other exceptions Cervical / Trunk Exceptions: cervical/facial tic noted, forward  head, rounded shoulders with sitting EOB  Communication   Communication: Expressive difficulties (very limited verbal responses)  Cognition Arousal/Alertness: Awake/alert Behavior During Therapy: Flat affect Overall Cognitive Status: No family/caregiver present to determine baseline cognitive functioning                                 General Comments: not oriented to place, time or situation, able to state first, name and maybe her mainden name.  She attends to follow simple one step commands with increased time and mod cues.      General Comments General comments (skin integrity, edema, etc.): errythema under heels noted so floated on pillow after back to bed    Exercises     Assessment/Plan    PT Assessment Patient needs continued PT services  PT Problem List Decreased activity tolerance;Pain;Decreased mobility;Decreased strength;Decreased balance       PT Treatment Interventions Therapeutic activities;Therapeutic exercise;Patient/family education;DME instruction;Balance training;Functional mobility training    PT Goals (Current goals can be found in the Care Plan section)  Acute Rehab PT Goals Patient Stated Goal: unable to state PT Goal Formulation: Patient unable to participate in goal setting Time For Goal Achievement: 05/19/20 Potential to Achieve Goals: Fair    Frequency Min 2X/week   Barriers to discharge        Co-evaluation               AM-PAC PT "6 Clicks" Mobility  Outcome Measure Help needed turning from your back to your side while in a flat bed without using bedrails?: Total Help needed moving from lying on your back to sitting on the side of a flat bed without using bedrails?: Total Help needed moving to and from a bed to a chair (including a wheelchair)?: Total Help needed standing up from a chair using your arms (e.g., wheelchair or bedside chair)?: Total Help needed to walk in hospital room?: Total Help needed climbing 3-5  steps with a railing? : Total 6 Click Score: 6    End of Session   Activity Tolerance: Patient tolerated treatment well Patient left: in bed;with call bell/phone within reach Nurse Communication: Mobility status PT Visit Diagnosis: History of falling (Z91.81);Muscle weakness (generalized) (M62.81);Other abnormalities of gait and mobility (R26.89)    Time: 2836-6294 PT Time Calculation (min) (ACUTE ONLY): 30 min   Charges:   PT Evaluation $PT Eval High Complexity: 1 High PT Treatments $Therapeutic Activity: 8-22 mins        Magda Kiel, PT Acute Rehabilitation Services TMLYY:[phone] [phone] 05/05/2020   Reginia Naas 05/05/2020, 12:38 PM

## 2020-05-05 NOTE — Progress Notes (Addendum)
     Referral received for Lauren [name] :goals of care discussion. Chart reviewed. Patient unable to engage in discussions due to confusion. Attempted to contact patient's daughter, Lauren [name]. Unable to reach. Voicemail left with contact information given.   1525:Daughter returned call. Tried to call back. Additional VM has been left.   Advanced Directives reviewed on file. Patient seen by my colleague, Lauren Rough, MD (11/2019) at that time MOST was completed with selections of DNR, comfort care, no artificial feeding, no IVF, and determine limitation and use of abx. At that time patient was discharged back to Memory Care unit with [facility] hospice being arranged.   PMT will re-attempt to contact family at a later time/date. Detailed note and recommendations to follow once GOC has been completed.   Thank you for your referral and allowing PMT to assist in Mrs. Lauren [name]'s care.   Alda Lea, AGPCNP-BC Palliative Medicine Team  Phone: [phone] Pager: [phone] Amion: N. Cousar   NO CHARGE

## 2020-05-05 NOTE — Care Management Important Message (Signed)
Important Message  Patient Details IM Letter placed in Patient's room. Name: Lauren [name] MRN: [id number] Date of Birth: June 29, 1924   Medicare Important Message Given:  Yes     [name] 05/05/2020, 1:05 PM

## 2020-05-05 NOTE — Progress Notes (Signed)
     Subjective: 1 Day Post-Op Procedure(s) (LRB): RIGHT HIP HEMIARTHROPLASTY (N/A)   Seen by Dr. Alvan Dame. Patient resting comfortably.  Stable with no reported events throughout the night.  Discharge disposition TBD.     Objective:   VITALS:   Vitals:   05/05/20 0706 05/05/20 0931  BP: (!) 171/72 (!) 150/75  Pulse:  94  Resp:    Temp:  98 F (36.7 C)  SpO2:  93%    Dorsiflexion/Plantar flexion intact Incision: dressing C/D/I No cellulitis present Compartment soft  LABS Recent Labs    05/02/20 2231 05/03/20 0528 05/05/20 0433  HGB 13.1 13.7 11.7*  HCT 41.8 43.5 36.6  WBC 10.6* 16.0* 12.8*  PLT 189 170 123*    Recent Labs    05/02/20 2231 05/03/20 0528 05/05/20 0433  NA 142 140 142  K 4.3 4.2 3.7  BUN 31* 29* 25*  CREATININE 1.19* 1.04* 0.98  GLUCOSE 165* 156* 147*     Assessment/Plan: 1 Day Post-Op Procedure(s) (LRB): RIGHT HIP HEMIARTHROPLASTY (N/A)  Up with therapy Discharge disposition TBD  Ortho recommendations:  Due to a fall risk would prefer ASA EC 81 mg (if tolerable) vs other anticoagulation, unless other medically indicated.  Tramadol for pain management (Rx written).  MiraLax and Colace for constipation  Iron 325 mg tid for 2-3 weeks (if tolerated)  WBAT on the right leg.  Dressing to remain in place until follow in clinic in 2 weeks.  Dressing is waterproof and may shower with it in place.  Follow up in 2 weeks at [facility]. Follow up with OLIN,Allison Deshotels D in 2 weeks.  Contact information:  [facility] [address], [address] [place] [phone]            Danae Orleans PA-C  [facility]  [place] [address] [place], [place], [place] [address] Phone: [phone] [email or web address] Facebook  [employer]

## 2020-05-05 NOTE — Significant Event (Signed)
Rapid Response Event Note   Reason for Call :  Called to bedside for concern of seizure  Initial Focused Assessment:  Neuro: Alert but not following commands or answering questions. History of dementia. Flinch to threat directly infront, but not to sides Resp: 3L [employer] satting 97%. Rhonchi and lots of oral secretions Cardiac: BP 145/70 (92) NSR in the 80s   Interventions:  Pt placed of HFNC due to increased WOB and increased oxygen needs Lots of deep oral suctioning dt concern for aspiration.  Breathing treatment given per protocol  MD came to bedside to evaluation pt. Daughter at bedside  Plan of Care:  MD at bedside and discussed goals of care with patient's daughter  Event Summary:   MD Notified: Shahmehdi Call Time: 1215 Arrival Time: 1217 End Time: [place], RN

## 2020-05-06 LAB — CBC
HCT: 32.1 % — ABNORMAL LOW (ref 36.0–46.0)
Hemoglobin: 10.3 g/dL — ABNORMAL LOW (ref 12.0–15.0)
MCH: 31.2 pg (ref 26.0–34.0)
MCHC: 32.1 g/dL (ref 30.0–36.0)
MCV: 97.3 fL (ref 80.0–100.0)
Platelets: 114 10*3/uL — ABNORMAL LOW (ref 150–400)
RBC: 3.3 MIL/uL — ABNORMAL LOW (ref 3.87–5.11)
RDW: 15.6 % — ABNORMAL HIGH (ref 11.5–15.5)
WBC: 10.9 10*3/uL — ABNORMAL HIGH (ref 4.0–10.5)
nRBC: 0 % (ref 0.0–0.2)

## 2020-05-06 LAB — BASIC METABOLIC PANEL
Anion gap: 11 (ref 5–15)
BUN: 29 mg/dL — ABNORMAL HIGH (ref 8–23)
CO2: 25 mmol/L (ref 22–32)
Calcium: 6.9 mg/dL — ABNORMAL LOW (ref 8.9–10.3)
Chloride: 104 mmol/L (ref 98–111)
Creatinine, Ser: 1.19 mg/dL — ABNORMAL HIGH (ref 0.44–1.00)
GFR, Estimated: 42 mL/min — ABNORMAL LOW (ref >60)
Glucose, Bld: 106 mg/dL — ABNORMAL HIGH (ref 70–99)
Potassium: 3.2 mmol/L — ABNORMAL LOW (ref 3.5–5.1)
Sodium: 140 mmol/L (ref 135–145)

## 2020-05-06 MED ORDER — POTASSIUM CHLORIDE 10 MEQ/100ML IV SOLN
10.0000 meq | INTRAVENOUS | Status: AC
Start: 2020-05-06 — End: 2020-05-06
  Administered 2020-05-06 (×2): 10 meq via INTRAVENOUS
  Filled 2020-05-06 (×2): qty 100

## 2020-05-06 MED ORDER — FUROSEMIDE 10 MG/ML IJ SOLN
40.0000 mg | Freq: Once | INTRAMUSCULAR | Status: AC
  Administered 2020-05-06: 40 mg via INTRAVENOUS
  Filled 2020-05-06: qty 4

## 2020-05-06 NOTE — Progress Notes (Signed)
PROGRESS NOTE    Patient: Lauren [name]                            PCP: Patient, No Pcp Per                    DOB: May 08, 1924            DOA: 05/02/2020 [id number]:[id number]             DOS: 05/06/2020, 11:55 AM   LOS: 3 days   Date of Service: The patient was seen and examined on 05/06/2020  Subjective:   Postop day #2 status post right hip hemiarthroplasty Tolerated procedure well.  Remained stable -on 6 7 L of oxygen, satting 94% Per nursing report yesterday and evaluation patient was respiratory stress, nebulizers, Lasix was given..  Patient family daughter was updated at bedside--discussed no heroic measures, continue to treat reversible causes .Marland Kitchen Recommending comfort measures which she is agreeable to.   Brief Narrative:   87 year old female with past medical history of dementia, hyperlipidemia, hypothyroidism, hyper tension and chronic kidney disease stage IIIa who presents to [facility] emergency department from [place] facility via EMS after patient fell out of bed. Patient is unable to provide a history due to advanced dementia.  Upon evaluation in the emergency department, right hip x-ray revealed a acute nondisplaced right femoral neck fracture.  Case was discussed with Dr. Alvan Dame with orthopedic surgery Right hip hemiarthroplasty 05/04/2020  -     Assessment & Plan:   Principal Problem:   Fracture of femoral neck, right, closed ([place]) Active Problems:   Essential hypertension   Dementia without behavioral disturbance ([place])   Hypothyroidism   Chronic kidney disease, stage 3a ([place])   Pneumonia of both lungs due to infectious organism   Mixed hyperlipidemia   Malnutrition of moderate degree  Principal Problem:    Fracture of femoral neck, right, closed ([place])  Patient suffered a fracture of the right femoral neck after a fall on evening of 05/02/2020  Remained stable, in respiratory distress overnight has stabilized    Postop day  #2  Status post right hip hemiarthroplasty 06/01/2020    Continue with pain management  Discussed with patient daughter that this is a palliative procedure, not anticipating much improvement post procedure with exception of better pain management-comfort  Daughter expressed understanding and agreed with current plan.  Acute respiratory failure -Patient remains on 7 L of oxygen, satting 94% at this time. -in the setting of possible pneumonia, postop, IV fluids -We will continue DuoNeb treatment, supplemental oxygen -We will keep the patient as comfortable as possible,  -Treat reversible causes such as volume overload, pain, pneumonia  Encephalopathic in setting of advanced dementia -Superimposed by dementia, multifactorial due to pneumonia, hypoxia, medication postop -We will continue to monitor closely -Treat reversible causes .Marland Kitchen With no heroic measures, keeping patient comfortable per family wishes   Active Problems:    Pneumonia of both lungs due to infectious organism   Multifocal pneumonia noted on chest x-ray during his presentation similar to chest imaging performed in September 2021 --remains clinically stable but a mild leukocytosis  We will continue empiric IV antibiotics for now Rocephin and doxycycline  Mildly hypertensive otherwise afebrile   SARS-CoV-2 negative,   Procalcitonin <0.10  CRP 2.3 Echocardiogram  >> prelim report reviewed    Essential hypertension We will continue as needed hydralazine, resume home medication of  metoprolol -If no response will add as needed labetalol     Dementia without behavioral disturbance (HCC)   Patient exhibiting significant agitation and confusion in the setting of advanced dementia  Attempting to manage patient's pain adequately  Attempting to avoid additional painful stimuli as much as possible  Fall precautions  Frequent redirection  Currently worsening mentation likely due to pain, narcotics     Hypothyroidism   Continue home regimen of levothyroxine    Chronic kidney disease, stage 3a (HCC)   Creatinine near baseline, 1.19, 1.04, 0.98  Strict input and output monitoring  Avoiding nephrotoxic agents if at all possible  Monitoring renal function and electrolytes with serial chemistries    Mixed hyperlipidemia   Continue home regimen of statin therapy  Goal of care: -Discussed with daughter at bedside, agreeable to treat reversible cause, may proceed with palliative care She confirmed DNR/DNI status  If no improvement in this hospitalization family is agreeable to comfort care measures, possible hospice  ------------------------------------------------------------------------------------------------------------------------------------ Nutritional status:  The patient's BMI is: Body mass index is 18.57 kg/m. I agree with the assessment and plan as outlined --   ------------------------------------------------------------------------------------------------------------------------------------ Cultures; Blood Cultures x 2 >> NGT Urine Culture  >>> NGT    Antimicrobials:  IV antibiotics for now Rocephin and doxycycline  Consultants: Orthopedic team palliative care team   ------------------------------------------------------------------------------------------------------------------------------------  DVT prophylaxis:  SCD/Compression stockings Code Status:   Code Status: DNR  Family Communication: Discussed with daughter in detail- they expressed understanding and agreement of above. -Advance care planning reviewed.   Admission status:   Status is: Inpatient   Dispo: The patient is from: SNF ([facility])              Anticipated d/c is to: SNF with palliative care              Anticipated d/c date is: 2 days              Patient currently is not medically stable to d/c.   Difficult to place patient No  Level of care:  Telemetry   Procedures:   No admission procedures for hospital encounter.     Antimicrobials:  Anti-infectives (From admission, onward)   Start     Dose/Rate Route Frequency Ordered Stop   05/05/20 1000  doxycycline (VIBRA-TABS) tablet 100 mg        100 mg Oral Every 12 hours 05/05/20 0902     05/04/20 2300  ceFAZolin (ANCEF) IVPB 2g/100 mL premix        2 g 200 mL/hr over 30 Minutes Intravenous Every 6 hours 05/04/20 2200 05/05/20 0836   05/04/20 1630  ceFAZolin (ANCEF) IVPB 2g/100 mL premix        2 g 200 mL/hr over 30 Minutes Intravenous On call to O.R. 05/04/20 1622 05/04/20 1750   05/03/20 2200  cefTRIAXone (ROCEPHIN) 2 g in sodium chloride 0.9 % 100 mL IVPB  Status:  Discontinued        2 g 200 mL/hr over 30 Minutes Intravenous Every 24 hours 05/03/20 0136 05/03/20 0758   05/03/20 2200  cefTRIAXone (ROCEPHIN) 1 g in sodium chloride 0.9 % 100 mL IVPB        1 g 200 mL/hr over 30 Minutes Intravenous Every 24 hours 05/03/20 0800     05/03/20 1000  doxycycline (VIBRAMYCIN) 100 mg in sodium chloride 0.9 % 250 mL IVPB  Status:  Discontinued        100 mg 125 mL/hr  over 120 Minutes Intravenous Every 12 hours 05/03/20 0136 05/03/20 0758   05/03/20 1000  doxycycline (VIBRAMYCIN) 100 mg in sodium chloride 0.9 % 250 mL IVPB  Status:  Discontinued        100 mg 125 mL/hr over 120 Minutes Intravenous Every 12 hours 05/03/20 0800 05/05/20 0902   05/02/20 2315  cefTRIAXone (ROCEPHIN) 1 g in sodium chloride 0.9 % 100 mL IVPB        1 g 200 mL/hr over 30 Minutes Intravenous  Once 05/02/20 2305 05/03/20 0054   05/02/20 2315  doxycycline (VIBRA-TABS) tablet 100 mg        100 mg Oral  Once 05/02/20 2305 05/02/20 2315       Medication:  . aspirin EC  81 mg Oral BID  . atorvastatin  5 mg Oral Daily  . buPROPion  150 mg Oral Daily  . chlorhexidine  15 mL Mouth Rinse BID  . Chlorhexidine Gluconate Cloth  6 each Topical Daily  . docusate sodium  100 mg Oral BID  . doxycycline  100 mg  Oral Q12H  . feeding supplement  237 mL Oral BID BM  . ferrous sulfate  325 mg Oral TID PC  . glycopyrrolate  0.4 mg Intravenous TID  . levothyroxine  100 mcg Oral Q0600  . mouth rinse  15 mL Mouth Rinse BID  . memantine  14 mg Oral Daily  . metoprolol tartrate  50 mg Oral BID  . mirtazapine  7.5 mg Oral QHS    acetaminophen, bisacodyl, hydrALAZINE, menthol-cetylpyridinium **OR** phenol, methocarbamol **OR** methocarbamol (ROBAXIN) IV, metoCLOPramide **OR** metoCLOPramide (REGLAN) injection, morphine injection, ondansetron **OR** ondansetron (ZOFRAN) IV, polyethylene glycol, traMADol   Objective:   Vitals:   05/06/20 0000 05/06/20 0400 05/06/20 0757 05/06/20 0935  BP:  135/88  137/74  Pulse:  73  80  Resp:  20  (!) 21  Temp: 99.5 F (37.5 C) 99.4 F (37.4 C)  99.3 F (37.4 C)  TempSrc: Axillary Axillary  Oral  SpO2:  98% 97% 94%  Weight:      Height:        Intake/Output Summary (Last 24 hours) at 05/06/2020 1155 Last data filed at 05/06/2020 0811 Gross per 24 hour  Intake 911.71 ml  Output 1250 ml  Net -338.29 ml   Filed Weights   05/03/20 0143  Weight: 55.4 kg     Examination:      Physical Exam:   General:   Lethargic, confused  HEENT:  Normocephalic, PERRL, otherwise with in Normal limits   Neuro:  CNII-XII intact. , normal motor and sensation, reflexes intact   Lungs:   Clear to auscultation BL, Respirations unlabored, no wheezes / crackles  Cardio:    S1/S2, RRR, No murmure, No Rubs or Gallops   Abdomen:   Soft, non-tender, bowel sounds active all four quadrants,  no guarding or peritoneal signs.  Muscular skeletal:   Severe generalized weaknesses Limited exam - in bed, able to move all 4 extremities, Normal strength,  2+ pulses,  symmetric, No pitting edema  Skin:  Dry, warm to touch, negative for any Rashes, right hip surgical wound  Wounds: Please see nursing documentation                LABs:  CBC Latest Ref Rng & Units 05/06/2020  05/05/2020 05/03/2020  WBC 4.0 - 10.5 K/uL 10.9(H) 12.8(H) 16.0(H)  Hemoglobin 12.0 - 15.0 g/dL 10.3(L) 11.7(L) 13.7  Hematocrit 36.0 - 46.0 % 32.1(L) 36.6 43.5  Platelets 150 - 400 K/uL 114(L) 123(L) 170   CMP Latest Ref Rng & Units 05/06/2020 05/05/2020 05/03/2020  Glucose 70 - 99 mg/dL 106(H) 147(H) 156(H)  BUN 8 - 23 mg/dL 29(H) 25(H) 29(H)  Creatinine 0.44 - 1.00 mg/dL 1.19(H) 0.98 1.04(H)  Sodium 135 - 145 mmol/L 140 142 140  Potassium 3.5 - 5.1 mmol/L 3.2(L) 3.7 4.2  Chloride 98 - 111 mmol/L 104 105 104  CO2 22 - 32 mmol/L 25 25 22   Calcium 8.9 - 10.3 mg/dL 6.9(L) 7.1(L) 7.8(L)  Total Protein 6.5 - 8.1 g/dL - - 7.0  Total Bilirubin 0.3 - 1.2 mg/dL - - 1.0  Alkaline Phos 38 - 126 U/L - - 107  AST 15 - 41 U/L - - 41  ALT 0 - 44 U/L - - 35       Micro Results Recent Results (from the past 240 hour(s))  SARS CORONAVIRUS 2 (TAT 6-24 HRS) Nasopharyngeal Nasopharyngeal Swab     Status: None   Collection Time: 05/02/20 10:33 PM   Specimen: Nasopharyngeal Swab  Result Value Ref Range Status   SARS Coronavirus 2 NEGATIVE NEGATIVE Final    Comment: (NOTE) SARS-CoV-2 target nucleic acids are NOT DETECTED.  The SARS-CoV-2 RNA is generally detectable in upper and lower respiratory specimens during the acute phase of infection. Negative results do not preclude SARS-CoV-2 infection, do not rule out co-infections with other pathogens, and should not be used as the sole basis for treatment or other patient management decisions. Negative results must be combined with clinical observations, patient history, and epidemiological information. The expected result is Negative.  Fact Sheet for Patients: SugarRoll.be  Fact Sheet for Healthcare Providers: https://www.woods-mathews.com/  This test is not yet approved or cleared by the [place] FDA and  has been authorized for detection and/or diagnosis of SARS-CoV-2 by FDA under an Emergency Use  Authorization (EUA). This EUA will remain  in effect (meaning this test can be used) for the duration of the COVID-19 declaration under Se ction 564(b)(1) of the Act, 21 U.S.C. section 360bbb-3(b)(1), unless the authorization is terminated or revoked sooner.  Performed at [facility], [place] [address], [place], [place] [address]   Resp Panel by RT-PCR (Flu A&B, Covid) Nasopharyngeal Swab     Status: None   Collection Time: 05/03/20 12:44 AM   Specimen: Nasopharyngeal Swab; Nasopharyngeal(NP) swabs in vial transport medium  Result Value Ref Range Status   SARS Coronavirus 2 by RT PCR NEGATIVE NEGATIVE Final    Comment: (NOTE) SARS-CoV-2 target nucleic acids are NOT DETECTED.  The SARS-CoV-2 RNA is generally detectable in upper respiratory specimens during the acute phase of infection. The lowest concentration of SARS-CoV-2 viral copies this assay can detect is 138 copies/mL. A negative result does not preclude SARS-Cov-2 infection and should not be used as the sole basis for treatment or other patient management decisions. A negative result may occur with  improper specimen collection/handling, submission of specimen other than nasopharyngeal swab, presence of viral mutation(s) within the areas targeted by this assay, and inadequate number of viral copies(<138 copies/mL). A negative result must be combined with clinical observations, patient history, and epidemiological information. The expected result is Negative.  Fact Sheet for Patients:  EntrepreneurPulse.com.au  Fact Sheet for Healthcare Providers:  IncredibleEmployment.be  This test is no t yet approved or cleared by the [place] FDA and  has been authorized for detection and/or diagnosis of SARS-CoV-2 by FDA under an Emergency Use Authorization (EUA). This EUA  will remain  in effect (meaning this test can be used) for the duration of the COVID-19 declaration under Section  564(b)(1) of the Act, 21 U.S.C.section 360bbb-3(b)(1), unless the authorization is terminated  or revoked sooner.       Influenza A by PCR NEGATIVE NEGATIVE Final   Influenza B by PCR NEGATIVE NEGATIVE Final    Comment: (NOTE) The Xpert Xpress SARS-CoV-2/FLU/RSV plus assay is intended as an aid in the diagnosis of influenza from Nasopharyngeal swab specimens and should not be used as a sole basis for treatment. Nasal washings and aspirates are unacceptable for Xpert Xpress SARS-CoV-2/FLU/RSV testing.  Fact Sheet for Patients: EntrepreneurPulse.com.au  Fact Sheet for Healthcare Providers: IncredibleEmployment.be  This test is not yet approved or cleared by the [place] FDA and has been authorized for detection and/or diagnosis of SARS-CoV-2 by FDA under an Emergency Use Authorization (EUA). This EUA will remain in effect (meaning this test can be used) for the duration of the COVID-19 declaration under Section 564(b)(1) of the Act, 21 U.S.C. section 360bbb-3(b)(1), unless the authorization is terminated or revoked.  Performed at [facility], [place] [address], [place], [place] [address]   Culture, blood (routine x 2)     Status: None (Preliminary result)   Collection Time: 05/03/20  5:28 AM   Specimen: BLOOD LEFT HAND  Result Value Ref Range Status   Specimen Description   Final    BLOOD LEFT HAND Performed at [facility] [address], [place], [place] [address]    Special Requests   Final    BOTTLES DRAWN AEROBIC ONLY Blood Culture adequate volume Performed at [facility] [address], [place], [place] [address]    Culture   Final    NO GROWTH 3 DAYS Performed at [facility], [place] [address] [place], [place] [address]    Report Status PENDING  Incomplete  Culture, blood (routine x 2)     Status: None (Preliminary result)   Collection Time: 05/03/20  5:28 AM    Specimen: BLOOD RIGHT HAND  Result Value Ref Range Status   Specimen Description   Final    BLOOD RIGHT HAND Performed at [facility] [address], [place], [place] [address]    Special Requests   Final    BOTTLES DRAWN AEROBIC ONLY Blood Culture adequate volume Performed at [facility] [address], [place], [place] [address]    Culture   Final    NO GROWTH 3 DAYS Performed at [facility], [place] [address], [place], [place] [address]    Report Status PENDING  Incomplete    Radiology Reports DG Chest 1 View  Result Date: 05/02/2020 CLINICAL DATA:  Fall EXAM: CHEST  1 VIEW COMPARISON:  12/05/2019 FINDINGS: Patchy focus of airspace disease in the right mid lung. Suspected consolidation left base with possible small effusion. Stable cardiomediastinal silhouette. No pneumothorax IMPRESSION: Patchy focus of airspace disease in the right mid lung with suspected patchy consolidation left base, possible pneumonia. Probable small left effusion. Electronically Signed   By: Donavan Foil M.D.   On: 05/02/2020 22:13   DG Pelvis Portable  Result Date: 05/04/2020 CLINICAL DATA:  Status post hemiarthroplasty. EXAM: PORTABLE PELVIS 1-2 VIEWS COMPARISON:  Radiograph 05/02/2020 FINDINGS: Right hemiarthroplasty in expected alignment. There is no periprosthetic lucency or fracture. Recent postsurgical change includes air and edema in the soft tissues. IMPRESSION: Right hemiarthroplasty without immediate postoperative complication. Electronically Signed  By: Keith Rake M.D.   On: 05/04/2020 22:41   DG Hip Unilat W or Wo Pelvis 2-3 Views Right  Result Date: 05/02/2020 CLINICAL DATA:  Fall EXAM: DG HIP (WITH OR WITHOUT PELVIS) 2-3V RIGHT COMPARISON:  None. FINDINGS: SI joints are non widened. Pubic symphysis appears intact. Acute nondisplaced right femoral neck fracture. No femoral head dislocation. IMPRESSION: Acute nondisplaced right femoral neck  fracture. Electronically Signed   By: Donavan Foil M.D.   On: 05/02/2020 22:12    SIGNED: Deatra James, MD, FHM. [facility],  Pager (please use [email or web address] to page/text) Please use Epic Secure Chat for non-urgent communication (7AM-7PM)  If 7PM-7AM, please contact night-coverage [email or web address], 05/06/2020, 11:55 AM

## 2020-05-07 DIAGNOSIS — Z515 Encounter for palliative care: Secondary | ICD-10-CM

## 2020-05-07 DIAGNOSIS — E440 Moderate protein-calorie malnutrition: Secondary | ICD-10-CM

## 2020-05-07 DIAGNOSIS — Z66 Do not resuscitate: Secondary | ICD-10-CM

## 2020-05-07 DIAGNOSIS — Z96649 Presence of unspecified artificial hip joint: Secondary | ICD-10-CM

## 2020-05-07 DIAGNOSIS — Z7189 Other specified counseling: Secondary | ICD-10-CM

## 2020-05-07 LAB — BASIC METABOLIC PANEL
Anion gap: 11 (ref 5–15)
BUN: 31 mg/dL — ABNORMAL HIGH (ref 8–23)
CO2: 26 mmol/L (ref 22–32)
Calcium: 6.9 mg/dL — ABNORMAL LOW (ref 8.9–10.3)
Chloride: 105 mmol/L (ref 98–111)
Creatinine, Ser: 1.02 mg/dL — ABNORMAL HIGH (ref 0.44–1.00)
GFR, Estimated: 51 mL/min — ABNORMAL LOW (ref >60)
Glucose, Bld: 101 mg/dL — ABNORMAL HIGH (ref 70–99)
Potassium: 3 mmol/L — ABNORMAL LOW (ref 3.5–5.1)
Sodium: 142 mmol/L (ref 135–145)

## 2020-05-07 LAB — CBC
HCT: 32 % — ABNORMAL LOW (ref 36.0–46.0)
Hemoglobin: 10.3 g/dL — ABNORMAL LOW (ref 12.0–15.0)
MCH: 31.1 pg (ref 26.0–34.0)
MCHC: 32.2 g/dL (ref 30.0–36.0)
MCV: 96.7 fL (ref 80.0–100.0)
Platelets: 139 10*3/uL — ABNORMAL LOW (ref 150–400)
RBC: 3.31 MIL/uL — ABNORMAL LOW (ref 3.87–5.11)
RDW: 15.4 % (ref 11.5–15.5)
WBC: 9.6 10*3/uL (ref 4.0–10.5)
nRBC: 0 % (ref 0.0–0.2)

## 2020-05-07 MED ORDER — POTASSIUM CHLORIDE CRYS ER 20 MEQ PO TBCR
40.0000 meq | EXTENDED_RELEASE_TABLET | Freq: Once | ORAL | Status: AC
  Administered 2020-05-07: 40 meq via ORAL
  Filled 2020-05-07: qty 2

## 2020-05-07 MED ORDER — MAGNESIUM SULFATE 2 GM/50ML IV SOLN
2.0000 g | Freq: Once | INTRAVENOUS | Status: AC
  Administered 2020-05-07: 2 g via INTRAVENOUS
  Filled 2020-05-07: qty 50

## 2020-05-07 NOTE — Consult Note (Signed)
Consultation Note Date: 05/07/2020   Patient Name: Lauren [name]  DOB: 1924-09-07  MRN: [id number]  Age / Sex: 87 y.o., female   PCP: Patient, No Pcp Per Referring Physician: Deatra James, MD   REASON FOR CONSULTATION:Establishing goals of care  Palliative Care consult requested for goals of care discussion in this 87 y.o. female with multiple medical problems including dementia, hyperlipidemia, hypothyroidism, hypertension, and chronic kidney disease stage III. Ms. Lauren [name] presented to the ED from [facility] facility after falling out of bed and complaining of right lower extremity discomfort. X-ray showed acute nondisplaced right femoral neck fracture. Patient evaluated by Orthopedic surgeon and underwent right hip hemiarthroplasty.   Clinical Assessment and Goals of Care: I have reviewed medical records including lab results, imaging, Epic notes, and MAR. I spoke with patient's daughter, Lauren [name] to discuss diagnosis prognosis, GOC, EOL wishes, disposition and options.  I introduced Palliative Medicine as specialized medical care for people living with serious illness. It focuses on providing relief from the symptoms and stress of a serious illness. The goal is to improve quality of life for both the patient and the family. Daughter verbalized understanding and appreciation.   We discussed a brief life review of the patient, along with her functional and nutritional status. Daughter reports patient has resided at [facility] for over 4 years (first 3 in their [place]) and most recently since April 2021 in their [place] section. Patient had 2 daughters. 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   Prior to admission daughter shares patient was declining in health although was doing fairly well on the Memory Care  unit. She was ambulatory and a wanderer. Her appetite was fair.   We discussed Her current illness and what it means in the larger context of Her on-going co-morbidities.Natural disease trajectory and expectations at EOL were discussed.  Lauren [name] expresses her awareness of her mother's current illness and co-morbidities. She shares she knows that her time is becoming limited and her main concern is for her mother to have an opportunity to thrive and if not then to focus on her comfort.   Daughter shares she feels her mother has somewhat improved over the past 24-48hrs. She is at the bedside with her and she is cooperative, alert, and interesting in her dinner.   I attempted to elicit values and goals of care important to the patient.    The difference between aggressive medical intervention and comfort care was considered in light of the patient's goals of care. I educated family on what comfort care measures would look like.   Lauren [name] shares that she would not wish to for any aggressive interventions. She would like to continue to treat the treatable as the medical team is currently doing. She feels that she is showing some improvement but is also aware this may be temporary or "as good as it gets!" She states she is ok with that as her mother's quality of life is what is most important and not quantity although she has lived a long and grateful 80 years.   Daughter states she would like to continue to take her mother's care day by day before making any final decisions. She is clear if her mother was to further decline or show no signs of a meaningful improvement/stability  she would wish then for care to focus on comfort/end-of-life.   Lauren [name] reports she is not certain but during conversations with her team at [facility] she was certain her mother was being followed by Palliative who assisting in minimizing her medications. She shares surprisingly after this was done patient seemed to be at her  best for those months until her recent fall.   Patient does have a documented advanced directive. This information was reviewed and was completed most recently by my colleague Dr. Domingo Cocking. During her admission in September 2021 she was connected with [facility] at the time of discharge. Lauren [name] confirms MOST form is correct with no needed changes.   Cardiopulmonary Resuscitation: Do Not Attempt Resuscitation (DNR/No CPR)  Medical Interventions: Comfort Measures: Keep clean, warm, and dry. Use medication by any route, positioning, wound care, and other measures to relieve pain and suffering. Use oxygen, suction and manual treatment of airway obstruction as needed for comfort. Do not transfer to the hospital unless comfort needs cannot be met in current location.  Antibiotics: Determine use of limitation of antibiotics when infection occurs  IV Fluids: No IV fluids (provide other measures to ensure comfort)  Feeding Tube: No feeding tube    Hospice and Palliative Care services outpatient were explained and offered. Daughter is interested in continuing conversations with our [place] team regarding potential options for discharge. she soul like her mother connected with Palliative at discharge if not already under their services. Daugher verbalized her understanding and awareness of both palliative and hospice's goals and philosophy of care.   Questions and concerns were addressed. The family was encouraged to call with questions or concerns.  PMT will continue to support holistically.   SOCIAL HISTORY:     reports that she has never smoked. She has never used smokeless tobacco. She reports that she does not drink alcohol and does not use drugs.  CODE STATUS: DNR  ADVANCE DIRECTIVES: Primary Decision Maker: Lauren [name] (daughter)    SYMPTOM MANAGEMENT: per attending   Palliative Prophylaxis:   Aspiration, Bowel Regimen, Delirium Protocol, Eye Care, Frequent Pain Assessment, Oral Care, Palliative  Wound Care and Turn Reposition  PSYCHO-SOCIAL/SPIRITUAL:  Support System: Family  Desire for further Chaplaincy support: No addressed  Additional Recommendations (Limitations, Scope, Preferences):  Minimize Medications, No Artificial Feeding and Treat the treatable, watchful waiting, DNR/DNI, no aggressive interventions  Education on hospice/palliative    PAST MEDICAL HISTORY: Past Medical History:  Diagnosis Date  . Anxiety   . Cancer ([place]) 12/01/2018   skin cancer on her head(L)  . High cholesterol   . Hypertension   . Osteoporosis   . Rosacea   . Thyroid disease   . Weight loss     ALLERGIES:  is allergic to aricept [donepezil hcl] and aspirin.   MEDICATIONS:  Current Facility-Administered Medications  Medication Dose Route Frequency Provider Last Rate Last Admin  . acetaminophen (TYLENOL) tablet 500 mg  500 mg Oral Q6H PRN Danae Orleans, PA-C   500 mg at 05/07/20 2426  . aspirin EC tablet 81 mg  81 mg Oral BID Danae Orleans, PA-C   81 mg at 05/07/20 8341  . atorvastatin (LIPITOR) tablet 5 mg  5 mg Oral Daily Danae Orleans, PA-C   5 mg at 05/07/20 9622  . bisacodyl (DULCOLAX) suppository 10 mg  10 mg Rectal Daily PRN Babish, Matthew, PA-C      . buPROPion (WELLBUTRIN XL) 24 hr tablet 150 mg  150 mg Oral Daily Danae Orleans, PA-C  150 mg at 05/07/20 0926  . cefTRIAXone (ROCEPHIN) 1 g in sodium chloride 0.9 % 100 mL IVPB  1 g Intravenous Q24H Danae Orleans, PA-C 200 mL/hr at 05/06/20 2254 1 g at 05/06/20 2254  . chlorhexidine (PERIDEX) 0.12 % solution 15 mL  15 mL Mouth Rinse BID Danae Orleans, PA-C   15 mL at 05/07/20 2334  . Chlorhexidine Gluconate Cloth 2 % PADS 6 each  6 each Topical Daily Deatra James, MD   6 each at 05/07/20 0930  . docusate sodium (COLACE) capsule 100 mg  100 mg Oral BID Danae Orleans, PA-C   100 mg at 05/07/20 3568  . doxycycline (VIBRA-TABS) tablet 100 mg  100 mg Oral Q12H Shahmehdi, Seyed A, MD   100 mg at 05/07/20 0926  .  feeding supplement (ENSURE ENLIVE / ENSURE PLUS) liquid 237 mL  237 mL Oral BID BM Danae Orleans, PA-C   237 mL at 05/07/20 0931  . ferrous sulfate tablet 325 mg  325 mg Oral TID PC Danae Orleans, PA-C   325 mg at 05/07/20 1316  . glycopyrrolate (ROBINUL) injection 0.4 mg  0.4 mg Intravenous TID Skipper Cliche A, MD   0.4 mg at 05/07/20 0931  . hydrALAZINE (APRESOLINE) injection 10 mg  10 mg Intravenous Q4H PRN Danae Orleans, PA-C   10 mg at 05/07/20 1316  . levothyroxine (SYNTHROID) tablet 100 mcg  100 mcg Oral Q0600 Danae Orleans, PA-C   100 mcg at 05/07/20 0551  . MEDLINE mouth rinse  15 mL Mouth Rinse BID Danae Orleans, PA-C   15 mL at 05/07/20 0931  . memantine (NAMENDA XR) 24 hr capsule 14 mg  14 mg Oral Daily Danae Orleans, PA-C   14 mg at 05/07/20 6168  . menthol-cetylpyridinium (CEPACOL) lozenge 3 mg  1 lozenge Oral PRN Danae Orleans, PA-C       Or  . phenol (CHLORASEPTIC) mouth spray 1 spray  1 spray Mouth/Throat PRN Babish, Rodman Key, PA-C      . methocarbamol (ROBAXIN) tablet 500 mg  500 mg Oral Q6H PRN Danae Orleans, PA-C   500 mg at 05/05/20 1203   Or  . methocarbamol (ROBAXIN) 500 mg in dextrose 5 % 50 mL IVPB  500 mg Intravenous Q6H PRN Babish, Matthew, PA-C      . metoCLOPramide (REGLAN) tablet 5-10 mg  5-10 mg Oral Q8H PRN Danae Orleans, PA-C       Or  . metoCLOPramide (REGLAN) injection 5-10 mg  5-10 mg Intravenous Q8H PRN Babish, Matthew, PA-C      . metoprolol tartrate (LOPRESSOR) tablet 50 mg  50 mg Oral BID Danae Orleans, PA-C   50 mg at 05/07/20 3729  . mirtazapine (REMERON) tablet 7.5 mg  7.5 mg Oral QHS Danae Orleans, PA-C   7.5 mg at 05/06/20 2255  . morphine 2 MG/ML injection 0.5 mg  0.5 mg Intravenous Q2H PRN Skipper Cliche A, MD   0.5 mg at 05/07/20 0305  . ondansetron (ZOFRAN) tablet 4 mg  4 mg Oral Q6H PRN Danae Orleans, PA-C       Or  . ondansetron [facility]) injection 4 mg  4 mg Intravenous Q6H PRN Babish, Matthew, PA-C      .  polyethylene glycol (MIRALAX / GLYCOLAX) packet 17 g  17 g Oral Daily PRN Danae Orleans, PA-C      . traMADol Veatrice Bourbon) tablet 50-100 mg  50-100 mg Oral Q6H PRN Danae Orleans, PA-C   100 mg at 05/06/20 [id number]  VITAL SIGNS: BP (!) 181/68   Pulse 96   Temp 98.7 F (37.1 C) (Axillary)   Resp (!) 31   Ht '5\' 8"'  (1.727 m)   Wt 55.4 kg   SpO2 97%   BMI 18.57 kg/m  Filed Weights   05/03/20 0143  Weight: 55.4 kg    Estimated body mass index is 18.57 kg/m as calculated from the following:   Height as of this encounter: '5\' 8"'  (1.727 m).   Weight as of this encounter: 55.4 kg.  LABS: CBC:    Component Value Date/Time   WBC 9.6 05/07/2020 0533   HGB 10.3 (L) 05/07/2020 0533   HGB 14.4 09/18/2015 1022   HCT 32.0 (L) 05/07/2020 0533   HCT 43.9 09/18/2015 1022   PLT 139 (L) 05/07/2020 0533   PLT 153 09/18/2015 1022   Comprehensive Metabolic Panel:    Component Value Date/Time   NA 142 05/07/2020 0533   NA 145 (H) 09/18/2015 1022   K 3.0 (L) 05/07/2020 0533   BUN 31 (H) 05/07/2020 0533   BUN 16 09/18/2015 1022   CREATININE 1.02 (H) 05/07/2020 0533   CREATININE 1.40 (H) 05/31/2019 1508   ALBUMIN 3.8 05/03/2020 0528   ALBUMIN 3.9 09/18/2015 1022     Review of Systems  Unable to perform ROS: Dementia   Prognosis: Guarded-Poor   Discharge Planning:  To Be Determined  Recommendations: . DNR/DNI-as confirmed by daughter . MOST form reviewed with no changes needed . Continue with current plan of care per medical team  . Daughter remains hopeful given some signs of improvement/stability over last 1-2 days. Watchful waiting. Treat the treatable, no improvement further decline would like to then focus on comfort.  Marland Kitchen PMT will continue to support and follow. Please call team line with urgent needs.   Palliative Performance Scale: PPS 20-30%              Daughter expressed understanding and was in agreement with this plan.   Thank you for allowing the Palliative Medicine  Team to assist in the care of this patient.  Time In: 1515 Time Out: 1620 Time Total: 65 min.   Visit consisted of counseling and education dealing with the complex and emotionally intense issues of symptom management and palliative care in the setting of serious and potentially life-threatening illness.Greater than 50%  of this time was spent counseling and coordinating care related to the above assessment and plan.  Signed by:  Alda Lea, AGPCNP-BC Palliative Medicine Team  Phone: [phone] Pager: [phone] Amion: Bjorn Pippin

## 2020-05-07 NOTE — Progress Notes (Signed)
   05/07/20 0416  Assess: MEWS Score  Temp 98.2 F (36.8 C)  BP 137/69  Pulse Rate 92  Resp (!) 29  Level of Consciousness Alert  SpO2 93 %  O2 Device HFNC;Nasal Cannula  Patient Activity (if Appropriate) In bed  O2 Flow Rate (L/min) 3 L/min  Assess: MEWS Score  MEWS Temp 0  MEWS Systolic 0  MEWS Pulse 0  MEWS RR 2  MEWS LOC 0  MEWS Score 2  MEWS Score Color Yellow  Assess: if the MEWS score is Yellow or Red  Were vital signs taken at a resting state? Yes  Focused Assessment No change from prior assessment  Early Detection of Sepsis Score *See Row Information* High  MEWS guidelines implemented *See Row Information* Yes  Treat  MEWS Interventions Administered prn meds/treatments  Take Vital Signs  Increase Vital Sign Frequency  Yellow: Q 2hr X 2 then Q 4hr X 2, if remains yellow, continue Q 4hrs  Escalate  MEWS: Escalate Yellow: discuss with charge nurse/RN and consider discussing with provider and RRT  Notify: Charge Nurse/RN  Name of Charge Nurse/RN Notified Karrie Doffing, RN  Date Charge Nurse/RN Notified 05/07/20  Time Charge Nurse/RN Notified [phone]

## 2020-05-07 NOTE — Progress Notes (Signed)
   05/07/20 1957  Assess: MEWS Score  Temp 97.8 F (36.6 C)  BP (!) 142/68  Pulse Rate 96  Resp (!) 27  SpO2 93 %  O2 Device Nasal Cannula  O2 Flow Rate (L/min) 1 L/min  Assess: MEWS Score  MEWS Temp 0  MEWS Systolic 0  MEWS Pulse 0  MEWS RR 2  MEWS LOC 0  MEWS Score 2  MEWS Score Color Yellow  Assess: if the MEWS score is Yellow or Red  Were vital signs taken at a resting state? Yes  Focused Assessment No change from prior assessment  Early Detection of Sepsis Score *See Row Information* High  MEWS guidelines implemented *See Row Information* No, previously yellow, continue vital signs every 4 hours  Treat  MEWS Interventions Administered scheduled meds/treatments  Notify: Charge Nurse/RN  Name of Charge Nurse/RN Notified Karrie Doffing, RN  Date Charge Nurse/RN Notified 05/07/20  Time Charge Nurse/RN Notified 2015

## 2020-05-07 NOTE — Progress Notes (Signed)
Notified on call provider about patient refusing to take po medications earlier in the shift. Patient ended up taking some of the po medications, but some of the meds the patient would take and then would spit it out. Patient continues to have issues with elevated blood pressure. Gave PRN hydralazine, which helped bring it down but continues to have issues with blood pressure rising. PRN morphine was also given to help with pain management, as well as tachypnea, but did not seem to help. Patient's heart rate also got up to the 160's during patient care, but resolved when patient was left alone and the light turned down.

## 2020-05-07 NOTE — Progress Notes (Signed)
Foley was d/c'ed at 0650 on 2/20. Patient has not voided, denies abdominal pain/pressure, no suprapubic tenderness. Bladder scanner revealed 16mL of urine. Daughter at bedside and reports she has drank two glasses of fluids since she has been in room.   Barbee Shropshire. Brigitte Pulse, RN

## 2020-05-07 NOTE — Progress Notes (Signed)
PROGRESS NOTE    Patient: Lauren [name]                            PCP: Patient, No Pcp Per                    DOB: 02/16/1925            DOA: 05/02/2020 ESP:[id number]             DOS: 05/07/2020, 11:23 AM   LOS: 4 days   Date of Service: The patient was seen and examined on 05/07/2020  Subjective:   Postop day #3 status post right hip hemiarthroplasty  Patient was seen and examined this morning, remained confused, but less agitated.. Breathing is less labored.  Patient has been weaned off 7 L to 3 of oxygen,  satting 96%  Per nursing staff agitated refused some of her p.o. meds   Brief Narrative:   87 year old female with past medical history of dementia, hyperlipidemia, hypothyroidism, hyper tension and chronic kidney disease stage IIIa who presents to [facility] emergency department from [facility] facility via EMS after patient fell out of bed. Patient is unable to provide a history due to advanced dementia.  Upon evaluation in the emergency department, right hip x-ray revealed a acute nondisplaced right femoral neck fracture.  Case was discussed with Dr. Alvan Dame with orthopedic surgery Right hip hemiarthroplasty 05/04/2020  -     Assessment & Plan:   Principal Problem:   Fracture of femoral neck, right, closed ([place]) Active Problems:   Essential hypertension   Dementia without behavioral disturbance ([place])   Hypothyroidism   Chronic kidney disease, stage 3a ([place])   Pneumonia of both lungs due to infectious organism   Mixed hyperlipidemia   Malnutrition of moderate degree  Principal Problem:    Fracture of femoral neck, right, closed ([place])  Patient suffered a fracture of the right femoral neck after a fall on evening of 05/02/2020  Remained stable    Postop day #3  Status post right hip hemiarthroplasty 06/01/2020    Continue with pain management  Discussed with patient daughter that this is a palliative procedure, not  anticipating much improvement post procedure with exception of better pain management-comfort  Daughter expressed understanding and agreed with current plan.  Acute respiratory failure -Continue to improve, O2 demand down from 7 L to 3 L satting 96% -in the setting of possible pneumonia, postop, IV fluids -We will continue DuoNeb treatment, supplemental oxygen -We will keep the patient as comfortable as possible,  -Treat reversible causes such as volume overload, pain, pneumonia  Encephalopathic in setting of advanced dementia -Superimposed by dementia, multifactorial due to pneumonia, hypoxia, medication postop -Mental status continues to wax and wane -At times she is alert follows command but continues to be confused, less agitation   Active Problems:    Pneumonia of both lungs due to infectious organism   Multifocal pneumonia noted on chest x-ray during his presentation similar to chest imaging performed in September 2021 --remains clinically stable but a mild leukocytosis  Continue IV antibiotics for now Rocephin and doxycycline  Hemodynamically stable afebrile normotensive   SARS-CoV-2 negative,   Procalcitonin <0.10  CRP 2.3 Echocardiogram  >> prelim report reviewed    Essential hypertension We will continue as needed hydralazine, resume home medication of metoprolol -If no response will add as needed labetalol     Dementia without behavioral  disturbance ([place])   Patient exhibiting significant agitation and confusion in the setting of advanced dementia  Attempting to manage patient's pain adequately  Attempting to avoid additional painful stimuli as much as possible  Fall precautions  Frequent redirection -       Hypothyroidism   Continue home regimen of levothyroxine    Chronic kidney disease, stage 3a ([facility])   Creatinine near baseline, 1.19, 1.04, 0.98  Strict input and output monitoring  Avoiding nephrotoxic agents if at all  possible  Monitoring renal function and electrolytes with serial chemistries    Mixed hyperlipidemia   Continue home regimen of statin therapy  Goal of care: -Discussed with daughter at bedside, agreeable to treat reversible cause, may proceed with palliative care She confirmed DNR/DNI status  If no improvement in this hospitalization family is agreeable to comfort care measures, possible hospice  ------------------------------------------------------------------------------------------------------------------------------------ Nutritional status:  The patient's BMI is: Body mass index is 18.57 kg/m. I agree with the assessment and plan as outlined --   ------------------------------------------------------------------------------------------------------------------------------------ Cultures; Blood Cultures x 2 >> NGT Urine Culture  >>> NGT    Antimicrobials:  IV antibiotics for now Rocephin and doxycycline  Consultants: Orthopedic team palliative care team   ------------------------------------------------------------------------------------------------------------------------------------  DVT prophylaxis:  SCD/Compression stockings Code Status:   Code Status: DNR  Family Communication: Discussed with daughter in detail- they expressed understanding and agreement of above. -Advance care planning reviewed.   Admission status:   Status is: Inpatient   Dispo: The patient is from: SNF ([facility])              Anticipated d/c is to: SNF with palliative care              Anticipated d/c date is: In a.m. if remains stable              Patient currently is not medically stable to d/c.   Difficult to place patient No  Level of care: Telemetry   Procedures:   No admission procedures for hospital encounter.     Antimicrobials:  Anti-infectives (From admission, onward)   Start     Dose/Rate Route Frequency Ordered Stop   05/05/20 1000   doxycycline (VIBRA-TABS) tablet 100 mg        100 mg Oral Every 12 hours 05/05/20 0902     05/04/20 2300  ceFAZolin (ANCEF) IVPB 2g/100 mL premix        2 g 200 mL/hr over 30 Minutes Intravenous Every 6 hours 05/04/20 2200 05/05/20 0836   05/04/20 1630  ceFAZolin (ANCEF) IVPB 2g/100 mL premix        2 g 200 mL/hr over 30 Minutes Intravenous On call to O.R. 05/04/20 1622 05/04/20 1750   05/03/20 2200  cefTRIAXone (ROCEPHIN) 2 g in sodium chloride 0.9 % 100 mL IVPB  Status:  Discontinued        2 g 200 mL/hr over 30 Minutes Intravenous Every 24 hours 05/03/20 0136 05/03/20 0758   05/03/20 2200  cefTRIAXone (ROCEPHIN) 1 g in sodium chloride 0.9 % 100 mL IVPB        1 g 200 mL/hr over 30 Minutes Intravenous Every 24 hours 05/03/20 0800     05/03/20 1000  doxycycline (VIBRAMYCIN) 100 mg in sodium chloride 0.9 % 250 mL IVPB  Status:  Discontinued        100 mg 125 mL/hr over 120 Minutes Intravenous Every 12 hours 05/03/20 0136 05/03/20 0758   05/03/20 1000  doxycycline (VIBRAMYCIN)  100 mg in sodium chloride 0.9 % 250 mL IVPB  Status:  Discontinued        100 mg 125 mL/hr over 120 Minutes Intravenous Every 12 hours 05/03/20 0800 05/05/20 0902   05/02/20 2315  cefTRIAXone (ROCEPHIN) 1 g in sodium chloride 0.9 % 100 mL IVPB        1 g 200 mL/hr over 30 Minutes Intravenous  Once 05/02/20 2305 05/03/20 0054   05/02/20 2315  doxycycline (VIBRA-TABS) tablet 100 mg        100 mg Oral  Once 05/02/20 2305 05/02/20 2315       Medication:  . aspirin EC  81 mg Oral BID  . atorvastatin  5 mg Oral Daily  . buPROPion  150 mg Oral Daily  . chlorhexidine  15 mL Mouth Rinse BID  . Chlorhexidine Gluconate Cloth  6 each Topical Daily  . docusate sodium  100 mg Oral BID  . doxycycline  100 mg Oral Q12H  . feeding supplement  237 mL Oral BID BM  . ferrous sulfate  325 mg Oral TID PC  . glycopyrrolate  0.4 mg Intravenous TID  . levothyroxine  100 mcg Oral Q0600  . mouth rinse  15 mL Mouth Rinse BID  .  memantine  14 mg Oral Daily  . metoprolol tartrate  50 mg Oral BID  . mirtazapine  7.5 mg Oral QHS    acetaminophen, bisacodyl, hydrALAZINE, menthol-cetylpyridinium **OR** phenol, methocarbamol **OR** methocarbamol (ROBAXIN) IV, metoCLOPramide **OR** metoCLOPramide (REGLAN) injection, morphine injection, ondansetron **OR** ondansetron (ZOFRAN) IV, polyethylene glycol, traMADol   Objective:   Vitals:   05/07/20 0416 05/07/20 0600 05/07/20 0800 05/07/20 0926  BP: 137/69 (!) 165/76 (!) 146/67   Pulse: 92 88 85 (!) 105  Resp: (!) 29 (!) 28 (!) 30   Temp: 98.2 F (36.8 C) 98 F (36.7 C) 98.2 F (36.8 C)   TempSrc: Axillary Axillary Axillary   SpO2: 93% 96% 96%   Weight:      Height:        Intake/Output Summary (Last 24 hours) at 05/07/2020 1123 Last data filed at 05/07/2020 0600 Gross per 24 hour  Intake 400 ml  Output 1450 ml  Net -1050 ml   Filed Weights   05/03/20 0143  Weight: 55.4 kg     Examination:       Physical Exam:   General:   Awake, alert, confused  HEENT:  Normocephalic, PERRL, otherwise with in Normal limits   Neuro:  CNII-XII intact. , normal motor and sensation, reflexes intact   Lungs:    Bilateral rhonchi -, Respirations unlabored, no wheezes / crackles  Cardio:    S1/S2, RRR, No murmure, No Rubs or Gallops   Abdomen:   Soft, non-tender, bowel sounds active all four quadrants,  no guarding or peritoneal signs.  Muscular skeletal:   Severe generalized weaknesses Limited exam - in bed, able to move all 4 extremities, Normal strength,  2+ pulses,  symmetric, No pitting edema  Skin:  Dry, warm to touch, negative for any Rashes,  Wounds: Please see nursing documentation            LABs:  CBC Latest Ref Rng & Units 05/07/2020 05/06/2020 05/05/2020  WBC 4.0 - 10.5 K/uL 9.6 10.9(H) 12.8(H)  Hemoglobin 12.0 - 15.0 g/dL 10.3(L) 10.3(L) 11.7(L)  Hematocrit 36.0 - 46.0 % 32.0(L) 32.1(L) 36.6  Platelets 150 - 400 K/uL 139(L) 114(L) 123(L)   CMP  Latest Ref Rng & Units 05/07/2020 05/06/2020 05/05/2020  Glucose 70 - 99 mg/dL 101(H) 106(H) 147(H)  BUN 8 - 23 mg/dL 31(H) 29(H) 25(H)  Creatinine 0.44 - 1.00 mg/dL 1.02(H) 1.19(H) 0.98  Sodium 135 - 145 mmol/L 142 140 142  Potassium 3.5 - 5.1 mmol/L 3.0(L) 3.2(L) 3.7  Chloride 98 - 111 mmol/L 105 104 105  CO2 22 - 32 mmol/L 26 25 25   Calcium 8.9 - 10.3 mg/dL 6.9(L) 6.9(L) 7.1(L)  Total Protein 6.5 - 8.1 g/dL - - -  Total Bilirubin 0.3 - 1.2 mg/dL - - -  Alkaline Phos 38 - 126 U/L - - -  AST 15 - 41 U/L - - -  ALT 0 - 44 U/L - - -       Micro Results Recent Results (from the past 240 hour(s))  SARS CORONAVIRUS 2 (TAT 6-24 HRS) Nasopharyngeal Nasopharyngeal Swab     Status: None   Collection Time: 05/02/20 10:33 PM   Specimen: Nasopharyngeal Swab  Result Value Ref Range Status   SARS Coronavirus 2 NEGATIVE NEGATIVE Final    Comment: (NOTE) SARS-CoV-2 target nucleic acids are NOT DETECTED.  The SARS-CoV-2 RNA is generally detectable in upper and lower respiratory specimens during the acute phase of infection. Negative results do not preclude SARS-CoV-2 infection, do not rule out co-infections with other pathogens, and should not be used as the sole basis for treatment or other patient management decisions. Negative results must be combined with clinical observations, patient history, and epidemiological information. The expected result is Negative.  Fact Sheet for Patients: SugarRoll.be  Fact Sheet for Healthcare Providers: https://www.woods-mathews.com/  This test is not yet approved or cleared by the [place] FDA and  has been authorized for detection and/or diagnosis of SARS-CoV-2 by FDA under an Emergency Use Authorization (EUA). This EUA will remain  in effect (meaning this test can be used) for the duration of the COVID-19 declaration under Se ction 564(b)(1) of the Act, 21 U.S.C. section 360bbb-3(b)(1), unless the  authorization is terminated or revoked sooner.  Performed at [facility], [place] [address], [place], [place] [address]   Resp Panel by RT-PCR (Flu A&B, Covid) Nasopharyngeal Swab     Status: None   Collection Time: 05/03/20 12:44 AM   Specimen: Nasopharyngeal Swab; Nasopharyngeal(NP) swabs in vial transport medium  Result Value Ref Range Status   SARS Coronavirus 2 by RT PCR NEGATIVE NEGATIVE Final    Comment: (NOTE) SARS-CoV-2 target nucleic acids are NOT DETECTED.  The SARS-CoV-2 RNA is generally detectable in upper respiratory specimens during the acute phase of infection. The lowest concentration of SARS-CoV-2 viral copies this assay can detect is 138 copies/mL. A negative result does not preclude SARS-Cov-2 infection and should not be used as the sole basis for treatment or other patient management decisions. A negative result may occur with  improper specimen collection/handling, submission of specimen other than nasopharyngeal swab, presence of viral mutation(s) within the areas targeted by this assay, and inadequate number of viral copies(<138 copies/mL). A negative result must be combined with clinical observations, patient history, and epidemiological information. The expected result is Negative.  Fact Sheet for Patients:  EntrepreneurPulse.com.au  Fact Sheet for Healthcare Providers:  IncredibleEmployment.be  This test is no t yet approved or cleared by the [place] FDA and  has been authorized for detection and/or diagnosis of SARS-CoV-2 by FDA under an Emergency Use Authorization (EUA). This EUA will remain  in effect (meaning this test can be used) for the duration of the COVID-19 declaration under Section  564(b)(1) of the Act, 21 U.S.C.section 360bbb-3(b)(1), unless the authorization is terminated  or revoked sooner.       Influenza A by PCR NEGATIVE NEGATIVE Final   Influenza B by PCR NEGATIVE NEGATIVE Final     Comment: (NOTE) The Xpert Xpress SARS-CoV-2/FLU/RSV plus assay is intended as an aid in the diagnosis of influenza from Nasopharyngeal swab specimens and should not be used as a sole basis for treatment. Nasal washings and aspirates are unacceptable for Xpert Xpress SARS-CoV-2/FLU/RSV testing.  Fact Sheet for Patients: EntrepreneurPulse.com.au  Fact Sheet for Healthcare Providers: IncredibleEmployment.be  This test is not yet approved or cleared by the [place] FDA and has been authorized for detection and/or diagnosis of SARS-CoV-2 by FDA under an Emergency Use Authorization (EUA). This EUA will remain in effect (meaning this test can be used) for the duration of the COVID-19 declaration under Section 564(b)(1) of the Act, 21 U.S.C. section 360bbb-3(b)(1), unless the authorization is terminated or revoked.  Performed at [facility], [place] [address], [place], [place] [address]   Culture, blood (routine x 2)     Status: None (Preliminary result)   Collection Time: 05/03/20  5:28 AM   Specimen: BLOOD LEFT HAND  Result Value Ref Range Status   Specimen Description   Final    BLOOD LEFT HAND Performed at [facility] [address], [place], [place] [address]    Special Requests   Final    BOTTLES DRAWN AEROBIC ONLY Blood Culture adequate volume Performed at [facility] [address] [place], [place] [address]    Culture   Final    NO GROWTH 4 DAYS Performed at [facility], [place] [address] [place], [place] [address]    Report Status PENDING  Incomplete  Culture, blood (routine x 2)     Status: None (Preliminary result)   Collection Time: 05/03/20  5:28 AM   Specimen: BLOOD RIGHT HAND  Result Value Ref Range Status   Specimen Description   Final    BLOOD RIGHT HAND Performed at [facility] [address] [place], [place] [address]    Special  Requests   Final    BOTTLES DRAWN AEROBIC ONLY Blood Culture adequate volume Performed at [facility] [address], [place], [place] [address]    Culture   Final    NO GROWTH 4 DAYS Performed at [facility], [place] [address], [place], [place] [address]    Report Status PENDING  Incomplete    Radiology Reports DG Chest 1 View  Result Date: 05/02/2020 CLINICAL DATA:  Fall EXAM: CHEST  1 VIEW COMPARISON:  12/05/2019 FINDINGS: Patchy focus of airspace disease in the right mid lung. Suspected consolidation left base with possible small effusion. Stable cardiomediastinal silhouette. No pneumothorax IMPRESSION: Patchy focus of airspace disease in the right mid lung with suspected patchy consolidation left base, possible pneumonia. Probable small left effusion. Electronically Signed   By: Donavan Foil M.D.   On: 05/02/2020 22:13   DG Pelvis Portable  Result Date: 05/04/2020 CLINICAL DATA:  Status post hemiarthroplasty. EXAM: PORTABLE PELVIS 1-2 VIEWS COMPARISON:  Radiograph 05/02/2020 FINDINGS: Right hemiarthroplasty in expected alignment. There is no periprosthetic lucency or fracture. Recent postsurgical change includes air and edema in the soft tissues. IMPRESSION: Right hemiarthroplasty without immediate postoperative complication. Electronically Signed   By: Keith Rake M.D.   On: 05/04/2020 22:41   DG Hip Unilat W or Wo  Pelvis 2-3 Views Right  Result Date: 05/02/2020 CLINICAL DATA:  Fall EXAM: DG HIP (WITH OR WITHOUT PELVIS) 2-3V RIGHT COMPARISON:  None. FINDINGS: SI joints are non widened. Pubic symphysis appears intact. Acute nondisplaced right femoral neck fracture. No femoral head dislocation. IMPRESSION: Acute nondisplaced right femoral neck fracture. Electronically Signed   By: Donavan Foil M.D.   On: 05/02/2020 22:12    SIGNED: Deatra James, MD, FHM. [facility],  Pager (please use [email or web address] to page/text) Please use Epic Secure Chat for  non-urgent communication (7AM-7PM)  If 7PM-7AM, please contact night-coverage [email or web address], 05/07/2020, 11:23 AM

## 2020-05-07 NOTE — Progress Notes (Signed)
Patient has not voided this shift. Oral fluids encouraged. Bladder scanner reveals 190 mL of urine in bladder. Patient denies abdominal pain, bladder is not distended. MD notified, will continue to monitor and I&O cath per protocol.   Patient weaned to 1 liter of oxygen with sats 93-95%. Patient did desat to 88% when on room air.   Barbee Shropshire. Brigitte Pulse, RN

## 2020-05-07 NOTE — Plan of Care (Signed)
  Problem: Coping: Goal: Level of anxiety will decrease Outcome: Progressing   Problem: Elimination: Goal: Will not experience complications related to urinary retention Outcome: Progressing   Problem: Pain Managment: Goal: General experience of comfort will improve Outcome: Progressing   Problem: Safety: Goal: Ability to remain free from injury will improve Outcome: Progressing   Problem: Skin Integrity: Goal: Risk for impaired skin integrity will decrease Outcome: Progressing   Problem: Clinical Measurements: Goal: Ability to maintain a body temperature in the normal range will improve Outcome: Progressing   Problem: Respiratory: Goal: Ability to maintain a clear airway will improve Outcome: Progressing   Problem: Clinical Measurements: Goal: Postoperative complications will be avoided or minimized Outcome: Progressing   Problem: Pain Management: Goal: Pain level will decrease Outcome: Progressing

## 2020-05-08 ENCOUNTER — Other Ambulatory Visit (HOSPITAL_COMMUNITY): Payer: Self-pay | Admitting: Family Medicine

## 2020-05-08 LAB — BASIC METABOLIC PANEL
Anion gap: 11 (ref 5–15)
BUN: 34 mg/dL — ABNORMAL HIGH (ref 8–23)
CO2: 26 mmol/L (ref 22–32)
Calcium: 7.3 mg/dL — ABNORMAL LOW (ref 8.9–10.3)
Chloride: 106 mmol/L (ref 98–111)
Creatinine, Ser: 0.92 mg/dL (ref 0.44–1.00)
GFR, Estimated: 58 mL/min — ABNORMAL LOW (ref >60)
Glucose, Bld: 107 mg/dL — ABNORMAL HIGH (ref 70–99)
Potassium: 3.1 mmol/L — ABNORMAL LOW (ref 3.5–5.1)
Sodium: 143 mmol/L (ref 135–145)

## 2020-05-08 LAB — MAGNESIUM: Magnesium: 2.6 mg/dL — ABNORMAL HIGH (ref 1.7–2.4)

## 2020-05-08 LAB — CULTURE, BLOOD (ROUTINE X 2)
Culture: NO GROWTH
Culture: NO GROWTH
Special Requests: ADEQUATE
Special Requests: ADEQUATE

## 2020-05-08 LAB — CBC
HCT: 32.9 % — ABNORMAL LOW (ref 36.0–46.0)
Hemoglobin: 10.4 g/dL — ABNORMAL LOW (ref 12.0–15.0)
MCH: 30.9 pg (ref 26.0–34.0)
MCHC: 31.6 g/dL (ref 30.0–36.0)
MCV: 97.6 fL (ref 80.0–100.0)
Platelets: 143 10*3/uL — ABNORMAL LOW (ref 150–400)
RBC: 3.37 MIL/uL — ABNORMAL LOW (ref 3.87–5.11)
RDW: 15.4 % (ref 11.5–15.5)
WBC: 10.9 10*3/uL — ABNORMAL HIGH (ref 4.0–10.5)
nRBC: 0 % (ref 0.0–0.2)

## 2020-05-08 LAB — SARS CORONAVIRUS 2 (TAT 6-24 HRS): SARS Coronavirus 2: NEGATIVE

## 2020-05-08 MED ORDER — DOCUSATE SODIUM 100 MG PO CAPS: 100.0000 mg | ORAL_CAPSULE | Freq: Two times a day (BID) | ORAL | 0 refills | Status: AC

## 2020-05-08 MED ORDER — DOXYCYCLINE HYCLATE 100 MG PO TABS: 100.0000 mg | ORAL_TABLET | Freq: Two times a day (BID) | ORAL | 0 refills | Status: AC

## 2020-05-08 MED ORDER — FERROUS SULFATE 325 (65 FE) MG PO TABS: 325.0000 mg | ORAL_TABLET | Freq: Three times a day (TID) | ORAL | 0 refills | Status: AC

## 2020-05-08 MED ORDER — POTASSIUM CHLORIDE CRYS ER 20 MEQ PO TBCR
40.0000 meq | EXTENDED_RELEASE_TABLET | Freq: Every day | ORAL | Status: DC
  Administered 2020-05-08 – 2020-05-11 (×4): 40 meq via ORAL
  Filled 2020-05-08 (×4): qty 2

## 2020-05-08 MED FILL — DOXYCYCLINE HYCLATE 100 MG: 100 | 5 days supply | Qty: 10 | Fill #0

## 2020-05-08 NOTE — Care Management Important Message (Signed)
Important Message  Patient Details IM Letter placed in Patient's room. Name: Lauren [name] MRN: [id number] Date of Birth: 12-29-1924   Medicare Important Message Given:  Yes     [name] 05/08/2020, 12:59 PM

## 2020-05-08 NOTE — Progress Notes (Addendum)
Physical Therapy Treatment Patient Details Name: Lauren [name] MRN: [id number] DOB: Aug 14, 1924 Today's Date: 05/08/2020    History of Present Illness Patient is a 87 year old female with past medical history of dementia, hyperlipidemia, hypothyroidism, hyper tension and chronic kidney disease stage IIIa who was admitted after fall out of bed at memory care facility.  Had R femoral neck fracture, now s/p R hip hemiarthroplasty.    PT Comments    Pt agreeable to working with therapy. Total assist to get to EOB-pt participated ~25% by moving trunk and L LE with cueing. Sat EOB with Min assist for static sitting balance. Pt was not quite ready for standing on today. Will continue to follow and progress activity as tolerated. Pt is unable to return to ALF at current level so recommendation has been updated to [facility] for continued rehab.     Follow Up Recommendations  SNF     Equipment Recommendations  None recommended by PT    Recommendations for Other Services       Precautions / Restrictions Precautions Precautions: Fall;Posterior Hip Precaution Comments: Pt unable to recall hip precautions 2* dementia. Will need constant reminders/cueing Restrictions Weight Bearing Restrictions: No RLE Weight Bearing: Weight bearing as tolerated    Mobility  Bed Mobility Overal bed mobility: Needs Assistance Bed Mobility: Supine to Sit;Sit to Supine     Supine to sit: Total assist;+2 for physical assistance;HOB elevated Sit to supine: Total assist;+2 for physical assistance;HOB elevated   General bed mobility comments: Assist for trunk and bil LEs. Utilized bedpad for scooting, positioning. Multimodal cueing and increased time required. Pt leaning to L side on today. Pt sat EOB for ~4-5 mintues with Min A and propping on L shoulder/elbow against raised HOB    Transfers                 General transfer comment: NT-pt unable on today  Ambulation/Gait                  Stairs             Wheelchair Mobility    Modified Rankin (Stroke Patients Only)       Balance Overall balance assessment: Needs assistance   Sitting balance-Leahy Scale: Poor Sitting balance - Comments: Required assistance for static sitting balance on today.                                    Cognition Arousal/Alertness: Awake/alert Behavior During Therapy: Flat affect Overall Cognitive Status: No family/caregiver present to determine baseline cognitive functioning                                 General Comments: not oriented to place, time or situation, able to state first name.She tends to follow simple one step commands with increased time and mod cues.      Exercises General Exercises - Lower Extremity Ankle Circles/Pumps: AROM;Both;10 reps;Supine Heel Slides: AAROM;Right;10 reps;Supine    General Comments        Pertinent Vitals/Pain Pain Assessment: Faces Faces Pain Scale: Hurts even more Pain Location: R hip with moving toward EOB, but denies pain in sitting Pain Descriptors / Indicators: Grimacing;Guarding Pain Intervention(s): Limited activity within patient's tolerance;Monitored during session;Repositioned    Home Living  Prior Function            PT Goals (current goals can now be found in the care plan section) Progress towards PT goals: Progressing toward goals    Frequency    Min 2X/week      PT Plan Discharge plan needs to be updated    Co-evaluation              AM-PAC PT "6 Clicks" Mobility   Outcome Measure  Help needed turning from your back to your side while in a flat bed without using bedrails?: Total Help needed moving from lying on your back to sitting on the side of a flat bed without using bedrails?: Total Help needed moving to and from a bed to a chair (including a wheelchair)?: Total Help needed standing up from a chair using your arms (e.g.,  wheelchair or bedside chair)?: Total Help needed to walk in hospital room?: Total Help needed climbing 3-5 steps with a railing? : Total 6 Click Score: 6    End of Session   Activity Tolerance: Patient tolerated treatment well Patient left: in bed;with call bell/phone within reach;with bed alarm set   PT Visit Diagnosis: History of falling (Z91.81);Muscle weakness (generalized) (M62.81);Other abnormalities of gait and mobility (R26.89)     Time: 1536-1550 PT Time Calculation (min) (ACUTE ONLY): 14 min  Charges:  $Therapeutic Activity: 8-22 mins                        Doreatha Massed, PT Acute Rehabilitation  Office: [phone] Pager: [phone]

## 2020-05-08 NOTE — NC FL2 (Signed)
[place] LEVEL OF CARE SCREENING TOOL     IDENTIFICATION  Patient Name: Lauren [name] Birthdate: [birth year] Sex: female Admission Date (Current Location): 05/02/2020  [facility] [place] and [place] Number:  Herbalist and Address:  [facility],  [place], [place]      Provider Number: [id number]  Attending Physician Name and Address:  Deatra James, MD  Relative Name and Phone Number:  [name] dtr [phone]    Current Level of Care: Hospital Recommended Level of Care: [place] Prior Approval Number:    Date Approved/Denied:   PASRR Number: [id number]  Discharge Plan: SNF    Current Diagnoses: Patient Active Problem List   Diagnosis Date Noted   Malnutrition of moderate degree 05/04/2020   Fracture of femoral neck, right, closed ([place]) 05/03/2020   Mixed hyperlipidemia 05/03/2020   Acute respiratory failure with hypoxemia ([place]) 12/05/2019   Acute hypoxemic respiratory failure ([place]) 12/05/2019   Pneumonia of both lungs due to infectious organism 12/02/2019   CAP (community acquired pneumonia) 12/01/2019   Respiratory failure ([address]) 12/01/2019   Hypotension 12/01/2019   Chronic kidney disease, stage 3a ([place]) 09/01/2017   Osteoporosis 10/21/2016   Weight gain 04/16/2016   Hypocalcemia 04/16/2016   Hypothyroidism 04/16/2016   Edema of right foot 04/16/2016   Dementia without behavioral disturbance ([place]) 10/31/2015   Postoperative hypothyroidism 03/24/2014   Loss of weight 03/24/2014   Essential hypertension    High cholesterol    Anxiety     Orientation RESPIRATION BLADDER Height & Weight     Self,Place  Normal Continent Weight: 55.4 kg Height:  5\' 8"  (172.7 cm)  BEHAVIORAL SYMPTOMS/MOOD NEUROLOGICAL BOWEL NUTRITION STATUS      Continent Diet (Regular)  AMBULATORY STATUS COMMUNICATION OF NEEDS Skin   Limited Assist Verbally Surgical wounds (R hip Hemiarthroplasty-surgical  incision site.)                       Personal Care Assistance Level of Assistance  Bathing,Feeding,Dressing Bathing Assistance: Limited assistance Feeding assistance: Limited assistance Dressing Assistance: Limited assistance     Functional Limitations Info  Sight,Hearing,Speech Sight Info: Impaired (eyeglasses)   Speech Info: Adequate    SPECIAL CARE FACTORS FREQUENCY  PT (By licensed PT),OT (By licensed OT)     PT Frequency: 5x week OT Frequency: 5x week            Contractures Contractures Info: Not present    Additional Factors Info  Code Status,Allergies,Psychotropic Code Status Info:  (DNR) Allergies Info:  (Aricept;aspirin) Psychotropic Info:  (namenda;remeron,wellbutrin)         Current Medications (05/08/2020):  This is the current hospital active medication list Current Facility-Administered Medications  Medication Dose Route Frequency Provider Last Rate Last Admin   acetaminophen (TYLENOL) tablet 500 mg  500 mg Oral Q6H PRN Danae Orleans, PA-C   500 mg at 05/07/20 8366   aspirin EC tablet 81 mg  81 mg Oral BID Danae Orleans, PA-C   81 mg at 05/08/20 2947   atorvastatin (LIPITOR) tablet 5 mg  5 mg Oral Daily Danae Orleans, PA-C   5 mg at 05/08/20 6546   bisacodyl (DULCOLAX) suppository 10 mg  10 mg Rectal Daily PRN Danae Orleans, PA-C       buPROPion (WELLBUTRIN XL) 24 hr tablet 150 mg  150 mg Oral Daily Danae Orleans, PA-C   150 mg at 05/08/20 5035   cefTRIAXone (ROCEPHIN) 1 g in  sodium chloride 0.9 % 100 mL IVPB  1 g Intravenous Q24H Danae Orleans, PA-C 200 mL/hr at 05/07/20 2229 1 g at 05/07/20 2229   chlorhexidine (PERIDEX) 0.12 % solution 15 mL  15 mL Mouth Rinse BID Danae Orleans, PA-C   15 mL at 05/08/20 9211   Chlorhexidine Gluconate Cloth 2 % PADS 6 each  6 each Topical Daily Skipper Cliche A, MD   6 each at 05/07/20 0930   docusate sodium (COLACE) capsule 100 mg  100 mg Oral BID Danae Orleans, PA-C   100 mg at  05/08/20 9417   doxycycline (VIBRA-TABS) tablet 100 mg  100 mg Oral Q12H Shahmehdi, Seyed A, MD   100 mg at 05/08/20 0953   feeding supplement (ENSURE ENLIVE / ENSURE PLUS) liquid 237 mL  237 mL Oral BID BM Danae Orleans, PA-C   237 mL at 05/07/20 1833   ferrous sulfate tablet 325 mg  325 mg Oral TID PC Danae Orleans, PA-C   325 mg at 05/08/20 4081   glycopyrrolate (ROBINUL) injection 0.4 mg  0.4 mg Intravenous TID Skipper Cliche A, MD   0.4 mg at 05/08/20 4481   hydrALAZINE (APRESOLINE) injection 10 mg  10 mg Intravenous Q4H PRN Danae Orleans, PA-C   10 mg at 05/08/20 1013   levothyroxine (SYNTHROID) tablet 100 mcg  100 mcg Oral Q0600 Danae Orleans, PA-C   100 mcg at 05/08/20 8563   MEDLINE mouth rinse  15 mL Mouth Rinse BID Danae Orleans, PA-C   15 mL at 05/08/20 1000   memantine (NAMENDA XR) 24 hr capsule 14 mg  14 mg Oral Daily Danae Orleans, PA-C   14 mg at 05/08/20 1497   menthol-cetylpyridinium (CEPACOL) lozenge 3 mg  1 lozenge Oral PRN Danae Orleans, PA-C       Or   phenol (CHLORASEPTIC) mouth spray 1 spray  1 spray Mouth/Throat PRN Danae Orleans, PA-C       methocarbamol (ROBAXIN) tablet 500 mg  500 mg Oral Q6H PRN Danae Orleans, PA-C   500 mg at 05/05/20 1203   Or   methocarbamol (ROBAXIN) 500 mg in dextrose 5 % 50 mL IVPB  500 mg Intravenous Q6H PRN Babish, [place]man [place], PA-C       metoCLOPramide (REGLAN) tablet 5-10 mg  5-10 mg Oral Q8H PRN Danae Orleans, PA-C       Or   metoCLOPramide (REGLAN) injection 5-10 mg  5-10 mg Intravenous Q8H PRN Babish, Matthew, PA-C       metoprolol tartrate (LOPRESSOR) tablet 50 mg  50 mg Oral BID Danae Orleans, PA-C   50 mg at 05/08/20 0263   mirtazapine (REMERON) tablet 7.5 mg  7.5 mg Oral QHS Danae Orleans, PA-C   7.5 mg at 05/07/20 2231   morphine 2 MG/ML injection 0.5 mg  0.5 mg Intravenous Q2H PRN Skipper Cliche A, MD   0.5 mg at 05/08/20 0931   ondansetron (ZOFRAN) tablet 4 mg  4 mg Oral Q6H PRN Danae Orleans, PA-C       Or   ondansetron [facility]) injection 4 mg  4 mg Intravenous Q6H PRN Babish, Matthew, PA-C       polyethylene glycol (MIRALAX / GLYCOLAX) packet 17 g  17 g Oral Daily PRN Babish, Matthew, PA-C       potassium chloride SA (KLOR-CON) CR tablet 40 mEq  40 mEq Oral Daily Shahmehdi, Seyed A, MD   40 mEq at 05/08/20 0953   traMADol (ULTRAM) tablet 50-100 mg  50-100 mg Oral Q6H  PRN Danae Orleans, PA-C   100 mg at 05/06/20 9753     Discharge Medications: Please see discharge summary for a list of discharge medications.  Relevant Imaging Results:  Relevant Lab Results:   Additional Information SSN [place] [id number]  Claire [place], Juliann Pulse, RN

## 2020-05-08 NOTE — TOC Progression Note (Addendum)
Transition of Care [facility]) - Progression Note    Patient Details  Name: Lauren [name] MRN: [id number] Date of Birth: 1924-11-15  [facility] CM/SW Contact  Remon Quinto, Juliann Pulse, RN Phone Number: 05/08/2020, 3:41 PM  Clinical Narrative:Patient unable to return back to ALF-memory care @ [facility] higher level of care. Await PT update note. Spoke to dtr [place] agree to SNF with palliative care services.   5p-updated fl2 w/PT recc SNF.Await bed offers.       Expected Discharge Plan: [place] Barriers to Discharge: Continued Medical Work up  Expected Discharge Plan and Services Expected Discharge Plan: [place] arrangements for the past 2 months: [place] (Memory care) Expected Discharge Date: 05/08/20                                     Social Determinants of Health (SDOH) Interventions    Readmission Risk Interventions No flowsheet data found.

## 2020-05-08 NOTE — Plan of Care (Signed)
  Problem: Pain Managment: Goal: General experience of comfort will improve Outcome: Progressing   Problem: Safety: Goal: Ability to remain free from injury will improve Outcome: Progressing   Problem: Skin Integrity: Goal: Risk for impaired skin integrity will decrease Outcome: Progressing   Problem: Clinical Measurements: Goal: Ability to maintain a body temperature in the normal range will improve Outcome: Progressing   Problem: Respiratory: Goal: Ability to maintain adequate ventilation will improve Outcome: Progressing Goal: Ability to maintain a clear airway will improve Outcome: Progressing   Problem: Clinical Measurements: Goal: Postoperative complications will be avoided or minimized Outcome: Progressing   Problem: Pain Management: Goal: Pain level will decrease Outcome: Progressing

## 2020-05-08 NOTE — Discharge Summary (Addendum)
Physician Discharge Summary [facility]    Patient: Lauren [name]                   Admit date: 05/02/2020   DOB: March 08, 1925             Discharge date:05/08/2020/11:41 AM TDD:[id number]                          PCP: Patient, No Pcp Per  Disposition: SNF   Recommendations for Outpatient Follow-up:   . Follow up: in 1 day at the SNF . Under palliative care . Watchful waiting. If no improvement further decline would like to then focus on comfort.   Discharge Condition: Stable   Code Status:   Code Status: DNR  Diet recommendation: Regular healthy diet   Discharge Diagnoses:    Principal Problem:   Fracture of femoral neck, right, closed ([place]) Active Problems:   Essential hypertension   Dementia without behavioral disturbance ([place])   Hypothyroidism   Chronic kidney disease, stage 3a ([place])   Pneumonia of both lungs due to infectious organism   Mixed hyperlipidemia   Malnutrition of moderate degree   History of Present Illness/ Hospital Course Lauren [name] Summary:   87 year old female with past medical history of dementia, hyperlipidemia, hypothyroidism, hyper tension and chronic kidney disease stage IIIa who presents to [facility] long [facility] emergency department from [place] facility via EMS after patient fell out of bed. Patient is unable to provide a history due to advanced dementia.  Upon evaluation in the emergency department, right hip x-ray revealed a acute nondisplaced right femoral neck fracture. Case was discussed with Dr. Alvan Dame with orthopedic surgery Right hip hemiarthroplasty 05/04/2020  -    Fracture of femoral neck, right, closed [facility])  Patient suffered a fracture of the right femoral neck after a fall on evening of 05/02/2020  Stable from orthopedic standpoint    Postop day #4  Status post right hip hemiarthroplasty 06/01/2020    Discussed with patient daughter that this is a palliative procedure, not  anticipating much improvement post procedure with exception of better pain management-comfort  Daughter expressed understanding and agreed with current plan.  Acute respiratory failure -Continue to improve, O2 demand down from 7 L to 3 L satting 96% -in the setting of possible pneumonia, postop, IV fluids -We will continue DuoNeb treatment, supplemental oxygen -We will keep the patient as comfortable as possible,  -Treat reversible causes such as volume overload, pain, pneumonia  Encephalopathic in setting of advanced dementia -Superimposed by dementia, multifactorial due to pneumonia, hypoxia, medication postop -Mental status continues to wax and wane -At times she is alert follows command but continues to be confused, less agitation   Active Problems:  Pneumonia of both lungs due to infectious organism   Multifocal pneumonia noted on chest x-ray during his presentation similar to chest imaging performed in September 2021 --remains clinically stable but a mild leukocytosis  Continue IV antibiotics for now Rocephin and doxycycline  Hemodynamically stable afebrile normotensive   SARS-CoV-2 negative,   Procalcitonin <0.10  CRP 2.3 Echocardiogram  >> prelim report reviewed  Essential hypertension We will continue as needed hydralazine, resume home medication of metoprolol -If no response will add as needed labetalol   Dementia without behavioral disturbance (HCC)   Patient exhibiting significant agitation and confusion in the setting of advanced dementia  Attempting to manage patient's pain adequately  Attempting to avoid additional painful stimuli as  much as possible  Fall precautions  Frequent redirection -     Hypothyroidism   Continue home regimen of levothyroxine  Chronic kidney disease, stage 3a (HCC)   Creatinine near baseline, 1.19, 1.04, 0.98  Strict input and output monitoring  Avoiding nephrotoxic agents if at all  possible  Monitoring renal function and electrolytes with serial chemistries  Mixed hyperlipidemia   Continue home regimen of statin therapy  Goal of care: -Discussed with daughter at bedside, agreeable to treat reversible cause, may proceed with palliative care She confirmed DNR/DNI status  If no improvement in this hospitalization family is agreeable to comfort care measures, possible hospice    Cultures; Blood Cultures x 2 >> NGT Urine Culture  >>> NGT   Antimicrobials:  IV antibiotics for now Rocephin completed on 05/08/2020 and doxycycline --to be continued for 5 more days  Consultants: Orthopedic team palliative care team   ------------------------------------------------------------------------------------------------------------------------------------  DVT prophylaxis:  SCD/Compression stockings Code Status:   Code Status: DNR  Family Communication: Discussed with daughter in detail- they expressed understanding and agreement of above. -Advance care planning reviewed with her daughter  Dispo: The patient is from: SNF [facility])  Anticipated d/c is to: SNF with palliative care  Anticipated d/c date is: today        Nutritional status:  Nutrition Problem: Moderate Malnutrition Etiology: chronic illness (advanced dementia) Signs/Symptoms: mild fat depletion,mild muscle depletion,moderate fat depletion,moderate muscle depletion Interventions: Ensure Enlive (each supplement provides 350kcal and 20 grams of protein)  The patient's BMI is: Body mass index is 18.57 kg/m. I agree with the assessment and plan as outlined .... Discharge Instructions:   Discharge Instructions    Activity as tolerated - No restrictions   Complete by: As directed    Diet - low sodium heart healthy   Complete by: As directed    Discharge instructions   Complete by: As directed    Under palliative care Watchful waiting. If  no improvement further decline would like to then focus on comfort.   Increase activity slowly   Complete by: As directed    No wound care   Complete by: As directed    Per surgery Ortho recommendations, instructions   Weight bearing as tolerated   Complete by: As directed    Posterior hip precautions.   Laterality: right   Extremity: Lower       Medication List    STOP taking these medications   atorvastatin 10 MG tablet Commonly known as: LIPITOR   bethanechol 10 MG tablet Commonly known as: URECHOLINE   guaiFENesin 600 MG 12 hr tablet Commonly known as: MUCINEX     TAKE these medications   acetaminophen 500 MG tablet Commonly known as: TYLENOL Take 2 tablets (1,000 mg total) by mouth every 8 (eight) hours. What changed:   how much to take  when to take this  reasons to take this   aspirin 81 MG EC tablet Take 1 tablet (81 mg total) by mouth 2 (two) times daily. Swallow whole.   buPROPion 150 MG 24 hr tablet Commonly known as: WELLBUTRIN XL TAKE 1 TABLET DAILY IN THE MORNING FOR ANXIETY What changed: See the new instructions.   docusate sodium 100 MG capsule Commonly known as: COLACE Take 1 capsule (100 mg total) by mouth 2 (two) times daily.   doxycycline 100 MG tablet Commonly known as: VIBRA-TABS Take 1 tablet (100 mg total) by mouth every 12 (twelve) hours for 5 days.   ENSURE PO  Take 1 Can by mouth in the morning and at bedtime.   ferrous sulfate 325 (65 FE) MG tablet Take 1 tablet (325 mg total) by mouth 3 (three) times daily after meals.   levothyroxine 100 MCG tablet Commonly known as: SYNTHROID Take 100 mcg by mouth daily before breakfast.   memantine 14 MG Cp24 24 hr capsule Commonly known as: NAMENDA XR TAKE 1 CAPSULE DAILY FOR MEMORY What changed: See the new instructions.   metoprolol tartrate 50 MG tablet Commonly known as: LOPRESSOR TAKE 1 TABLET TWICE A DAY   mirtazapine 7.5 MG tablet Commonly known as: REMERON Take 7.5 mg  by mouth at bedtime. What changed: Another medication with the same name was removed. Continue taking this medication, and follow the directions you see here.   OXYGEN Inhale 2 L into the lungs daily as needed (patient comfort).   traMADol 50 MG tablet Commonly known as: Ultram Take 1-2 tablets (50-100 mg total) by mouth every 6 (six) hours as needed.       Follow-up Information    Paralee Cancel, MD. Schedule an appointment as soon as possible for a visit in 2 weeks.   Specialty: Orthopedic Surgery Contact information: [address] [place] [address] [place] [address] [phone]              Allergies  Allergen Reactions  . Aricept [Donepezil Hcl] Other (See Comments)    Unknown reaction  . Aspirin Other (See Comments)    Upset stomach      Procedures /Studies:   DG Chest 1 View  Result Date: 05/02/2020 CLINICAL DATA:  Fall EXAM: CHEST  1 VIEW COMPARISON:  12/05/2019 FINDINGS: Patchy focus of airspace disease in the right mid lung. Suspected consolidation left base with possible small effusion. Stable cardiomediastinal silhouette. No pneumothorax IMPRESSION: Patchy focus of airspace disease in the right mid lung with suspected patchy consolidation left base, possible pneumonia. Probable small left effusion. Electronically Signed   By: Donavan Foil M.D.   On: 05/02/2020 22:13   DG Pelvis Portable  Result Date: 05/04/2020 CLINICAL DATA:  Status post hemiarthroplasty. EXAM: PORTABLE PELVIS 1-2 VIEWS COMPARISON:  Radiograph 05/02/2020 FINDINGS: Right hemiarthroplasty in expected alignment. There is no periprosthetic lucency or fracture. Recent postsurgical change includes air and edema in the soft tissues. IMPRESSION: Right hemiarthroplasty without immediate postoperative complication. Electronically Signed   By: Keith Rake M.D.   On: 05/04/2020 22:41   DG Hip Unilat W or Wo Pelvis 2-3 Views Right  Result Date: 05/02/2020 CLINICAL DATA:  Fall EXAM: DG HIP (WITH  OR WITHOUT PELVIS) 2-3V RIGHT COMPARISON:  None. FINDINGS: SI joints are non widened. Pubic symphysis appears intact. Acute nondisplaced right femoral neck fracture. No femoral head dislocation. IMPRESSION: Acute nondisplaced right femoral neck fracture. Electronically Signed   By: Donavan Foil M.D.   On: 05/02/2020 22:12    Subjective:   Patient was seen and examined 05/08/2020, 11:41 AM Patient stable today. No acute distress.  No issues overnight Stable for discharge.  Discharge Exam:    Vitals:   05/08/20 0412 05/08/20 0440 05/08/20 1000 05/08/20 1007  BP: (!) 145/63 (!) 145/63 (!) 172/98 (!) 164/84  Pulse:  91 80   Resp:  (!) 25 18   Temp:  98 F (36.7 C) 98.5 F (36.9 C)   TempSrc:  Axillary Oral   SpO2:  94% 98%   Weight:      Height:        General: Pt lying comfortably in  bed & appears in no obvious distress. Cardiovascular: S1 & S2 heard, RRR, S1/S2 +. No murmurs, rubs, gallops or clicks. No JVD or pedal edema. Respiratory: Clear to auscultation without wheezing, rhonchi or crackles. No increased work of breathing. Abdominal:  Non-distended, non-tender & soft. No organomegaly or masses appreciated. Normal bowel sounds heard. CNS: Alert and oriented. No focal deficits. Extremities: no edema, no cyanosis      The results of significant diagnostics from this hospitalization (including imaging, microbiology, ancillary and laboratory) are listed below for reference.      Microbiology:   Recent Results (from the past 240 hour(s))  SARS CORONAVIRUS 2 (TAT 6-24 HRS) Nasopharyngeal Nasopharyngeal Swab     Status: None   Collection Time: 05/02/20 10:33 PM   Specimen: Nasopharyngeal Swab  Result Value Ref Range Status   SARS Coronavirus 2 NEGATIVE NEGATIVE Final    Comment: (NOTE) SARS-CoV-2 target nucleic acids are NOT DETECTED.  The SARS-CoV-2 RNA is generally detectable in upper and lower respiratory specimens during the acute phase of infection.  Negative results do not preclude SARS-CoV-2 infection, do not rule out co-infections with other pathogens, and should not be used as the sole basis for treatment or other patient management decisions. Negative results must be combined with clinical observations, patient history, and epidemiological information. The expected result is Negative.  Fact Sheet for Patients: SugarRoll.be  Fact Sheet for Healthcare Providers: https://www.woods-mathews.com/  This test is not yet approved or cleared by the [place] FDA and  has been authorized for detection and/or diagnosis of SARS-CoV-2 by FDA under an Emergency Use Authorization (EUA). This EUA will remain  in effect (meaning this test can be used) for the duration of the COVID-19 declaration under Se ction 564(b)(1) of the Act, 21 U.S.C. section 360bbb-3(b)(1), unless the authorization is terminated or revoked sooner.  Performed at [facility], [place] [address], [place], [place] [address]   Resp Panel by RT-PCR (Flu A&B, Covid) Nasopharyngeal Swab     Status: None   Collection Time: 05/03/20 12:44 AM   Specimen: Nasopharyngeal Swab; Nasopharyngeal(NP) swabs in vial transport medium  Result Value Ref Range Status   SARS Coronavirus 2 by RT PCR NEGATIVE NEGATIVE Final    Comment: (NOTE) SARS-CoV-2 target nucleic acids are NOT DETECTED.  The SARS-CoV-2 RNA is generally detectable in upper respiratory specimens during the acute phase of infection. The lowest concentration of SARS-CoV-2 viral copies this assay can detect is 138 copies/mL. A negative result does not preclude SARS-Cov-2 infection and should not be used as the sole basis for treatment or other patient management decisions. A negative result may occur with  improper specimen collection/handling, submission of specimen other than nasopharyngeal swab, presence of viral mutation(s) within the areas targeted by this assay, and  inadequate number of viral copies(<138 copies/mL). A negative result must be combined with clinical observations, patient history, and epidemiological information. The expected result is Negative.  Fact Sheet for Patients:  EntrepreneurPulse.com.au  Fact Sheet for Healthcare Providers:  IncredibleEmployment.be  This test is no t yet approved or cleared by the [place] FDA and  has been authorized for detection and/or diagnosis of SARS-CoV-2 by FDA under an Emergency Use Authorization (EUA). This EUA will remain  in effect (meaning this test can be used) for the duration of the COVID-19 declaration under Section 564(b)(1) of the Act, 21 U.S.C.section 360bbb-3(b)(1), unless the authorization is terminated  or revoked sooner.       Influenza A by PCR NEGATIVE  NEGATIVE Final   Influenza B by PCR NEGATIVE NEGATIVE Final    Comment: (NOTE) The Xpert Xpress SARS-CoV-2/FLU/RSV plus assay is intended as an aid in the diagnosis of influenza from Nasopharyngeal swab specimens and should not be used as a sole basis for treatment. Nasal washings and aspirates are unacceptable for Xpert Xpress SARS-CoV-2/FLU/RSV testing.  Fact Sheet for Patients: EntrepreneurPulse.com.au  Fact Sheet for Healthcare Providers: IncredibleEmployment.be  This test is not yet approved or cleared by the [place] FDA and has been authorized for detection and/or diagnosis of SARS-CoV-2 by FDA under an Emergency Use Authorization (EUA). This EUA will remain in effect (meaning this test can be used) for the duration of the COVID-19 declaration under Section 564(b)(1) of the Act, 21 U.S.C. section 360bbb-3(b)(1), unless the authorization is terminated or revoked.  Performed at [facility], [place] [address] [place], [place] [address]   Culture, blood (routine x 2)     Status: None   Collection Time: 05/03/20  5:28  AM   Specimen: BLOOD LEFT HAND  Result Value Ref Range Status   Specimen Description   Final    BLOOD LEFT HAND Performed at [place] [address] [place], [place] [address]    Special Requests   Final    BOTTLES DRAWN AEROBIC ONLY Blood Culture adequate volume Performed at [place] [address] [place], [place] [address]    Culture   Final    NO GROWTH 5 DAYS Performed at [facility], [place] [address] [place], [place] [address]    Report Status 05/08/2020 FINAL  Final  Culture, blood (routine x 2)     Status: None   Collection Time: 05/03/20  5:28 AM   Specimen: BLOOD RIGHT HAND  Result Value Ref Range Status   Specimen Description   Final    BLOOD RIGHT HAND Performed at [place] [address], [place], [place] [address]    Special Requests   Final    BOTTLES DRAWN AEROBIC ONLY Blood Culture adequate volume Performed at [place] [address] [place], [place] [address]    Culture   Final    NO GROWTH 5 DAYS Performed at [facility], [place] [address] [place], [place] [address]    Report Status 05/08/2020 FINAL  Final     Labs:   CBC: Recent Labs  Lab 05/02/20 2231 05/03/20 0528 05/05/20 0433 05/06/20 0552 05/07/20 0533 05/08/20 0509  WBC 10.6* 16.0* 12.8* 10.9* 9.6 10.9*  NEUTROABS 8.7* 14.0*  --   --   --   --   HGB 13.1 13.7 11.7* 10.3* 10.3* 10.4*  HCT 41.8 43.5 36.6 32.1* 32.0* 32.9*  MCV 97.7 98.2 96.8 97.3 96.7 97.6  PLT 189 170 123* 114* 139* 035*   Basic Metabolic Panel: Recent Labs  Lab 05/03/20 0528 05/05/20 0433 05/06/20 0552 05/07/20 0533 05/08/20 0509  NA 140 142 140 142 143  K 4.2 3.7 3.2* 3.0* 3.1*  CL 104 105 104 105 106  CO2 22 25 25 26 26   GLUCOSE 156* 147* 106* 101* 107*  BUN 29* 25* 29* 31* 34*  CREATININE 1.04* 0.98 1.19* 1.02* 0.92  CALCIUM 7.8* 7.1* 6.9* 6.9* 7.3*  MG  --  1.8  --   --  2.6*   Liver Function  Tests: Recent Labs  Lab 05/02/20 2231 05/03/20 0528  AST 36 41  ALT 32 35  ALKPHOS 101  107  BILITOT 0.7 1.0  PROT 6.8 7.0  ALBUMIN 3.9 3.8   BNP (last 3 results) Recent Labs    05/03/20 0528  BNP 753.5*   Cardiac Enzymes: No results for input(s): CKTOTAL, CKMB, CKMBINDEX, TROPONINI in the last 168 hours. CBG: Recent Labs  Lab 05/05/20 1228  GLUCAP 101*   Hgb A1c No results for input(s): HGBA1C in the last 72 hours. Lipid Profile No results for input(s): CHOL, HDL, LDLCALC, TRIG, CHOLHDL, LDLDIRECT in the last 72 hours. Thyroid function studies No results for input(s): TSH, T4TOTAL, T3FREE, THYROIDAB in the last 72 hours.  Invalid input(s): FREET3 Anemia work up No results for input(s): VITAMINB12, FOLATE, FERRITIN, TIBC, IRON, RETICCTPCT in the last 72 hours. Urinalysis    Component Value Date/Time   COLORURINE YELLOW 05/02/2020 2233   APPEARANCEUR CLEAR 05/02/2020 2233   LABSPEC 1.014 05/02/2020 2233   PHURINE 6.0 05/02/2020 2233   GLUCOSEU NEGATIVE 05/02/2020 2233   HGBUR NEGATIVE 05/02/2020 2233   BILIRUBINUR NEGATIVE 05/02/2020 2233   KETONESUR NEGATIVE 05/02/2020 2233   PROTEINUR 30 (A) 05/02/2020 2233   UROBILINOGEN 0.2 02/12/2014 1015   NITRITE NEGATIVE 05/02/2020 2233   LEUKOCYTESUR NEGATIVE 05/02/2020 2233         Time coordinating discharge: Over 45 minutes  SIGNED: Deatra James, MD, FACP, FHM. [facility],  Please use [email or web address] to Page If 7PM-7AM, please contact night-coverage [email or web address] [email or web address] [facility] 05/08/2020, 11:41 AM

## 2020-05-08 NOTE — Progress Notes (Signed)
Notified provider about patient having sinus rhythm with heart rate ranging in the 80's to 120's with PACs, having dementia, and refusing oral meds. Patient also had a period where heart rate got up to the 160s-170s, but ended up resolving. Gave PRN morphine for pain management. Will continue to monitor patient.

## 2020-05-08 NOTE — Progress Notes (Signed)
Patient was bladder scanned and had 256 mL residual of urine. Patient had not voided since catheter was taken out. In and Out Cath was performed and patient had output of 425 mL of urine. Urine was amber colored with some sediment. Patient was followed up with a bladder scan with 0 mL residual of urine.

## 2020-05-08 NOTE — TOC Progression Note (Signed)
Transition of Care [facility]) - Progression Note    Patient Details  Name: Lauren [name] MRN: [id number] Date of Birth: 1924-12-07  Transition of Care [facility]) CM/SW Contact  Adina Puzzo, Juliann Pulse, RN Phone Number: 05/08/2020, 10:22 AM  Clinical Narrative: Faxed d/c summary,fl2,H&P,PT eval to [facility], memory care rep Charity-fax#[phone]-await response if able to accept back to [facility] levelmemory care. PT recc HHPT. Rapid covid ordered.      Expected Discharge Plan: Memory Care ([facility]) Barriers to Discharge: No Barriers Identified  Expected Discharge Plan and Services Expected Discharge Plan: Memory Care ([facility] [place] [facility])       Living arrangements for the past 2 months: [place] (Memory care) Expected Discharge Date: 05/08/20                                     Social Determinants of Health (SDOH) Interventions    Readmission Risk Interventions No flowsheet data found.

## 2020-05-08 NOTE — TOC Progression Note (Signed)
Transition of Care [facility]) - Progression Note    Patient Details  Name: Lauren [name] MRN: [id number] Date of Birth: 1924-08-12  Transition [facility]) CM/SW Contact  Nickole Adamek, Juliann Pulse, RN Phone Number: 05/08/2020, 11:55 AM  Clinical Narrative:[facility] unable to provide services-@ ALF memory care level. Dtr in agreement for SNF w/palliative care services. MD updated.PT to see prior sending info to insurance for auth.       Expected Discharge Plan: Skilled Nursing Facility Barriers to Discharge: Insurance Authorization  Expected Discharge Plan and Services Expected Discharge Plan: [place]       Living arrangements for the past 2 months: [place] (Memory care) Expected Discharge Date: 05/08/20                                     Social Determinants of Health (SDOH) Interventions    Readmission Risk Interventions No flowsheet data found.

## 2020-05-09 LAB — BASIC METABOLIC PANEL
Anion gap: 11 (ref 5–15)
BUN: 28 mg/dL — ABNORMAL HIGH (ref 8–23)
CO2: 26 mmol/L (ref 22–32)
Calcium: 7.7 mg/dL — ABNORMAL LOW (ref 8.9–10.3)
Chloride: 106 mmol/L (ref 98–111)
Creatinine, Ser: 0.84 mg/dL (ref 0.44–1.00)
GFR, Estimated: >60 mL/min (ref >60)
Glucose, Bld: 104 mg/dL — ABNORMAL HIGH (ref 70–99)
Potassium: 3.5 mmol/L (ref 3.5–5.1)
Sodium: 143 mmol/L (ref 135–145)

## 2020-05-09 LAB — CBC
HCT: 32.7 % — ABNORMAL LOW (ref 36.0–46.0)
Hemoglobin: 10.3 g/dL — ABNORMAL LOW (ref 12.0–15.0)
MCH: 30.6 pg (ref 26.0–34.0)
MCHC: 31.5 g/dL (ref 30.0–36.0)
MCV: 97 fL (ref 80.0–100.0)
Platelets: 174 10*3/uL (ref 150–400)
RBC: 3.37 MIL/uL — ABNORMAL LOW (ref 3.87–5.11)
RDW: 15.1 % (ref 11.5–15.5)
WBC: 9.4 10*3/uL (ref 4.0–10.5)
nRBC: 0 % (ref 0.0–0.2)

## 2020-05-09 MED ORDER — METOPROLOL TARTRATE 50 MG PO TABS
100.0000 mg | ORAL_TABLET | Freq: Two times a day (BID) | ORAL | Status: DC
  Administered 2020-05-09 – 2020-05-10 (×3): 100 mg via ORAL
  Filled 2020-05-09 (×4): qty 2

## 2020-05-09 MED ORDER — HYDRALAZINE HCL 50 MG PO TABS
50.0000 mg | ORAL_TABLET | Freq: Three times a day (TID) | ORAL | Status: DC
  Administered 2020-05-09 – 2020-05-11 (×6): 50 mg via ORAL
  Filled 2020-05-09 (×5): qty 1

## 2020-05-09 NOTE — Progress Notes (Signed)
Attempted to call report to [facility] [phone], awaiting return call.

## 2020-05-09 NOTE — Progress Notes (Signed)
Patient oxygen saturation 86 % on room air at rest. 2 liters [place] applied and o2 sat increased 95 %.

## 2020-05-09 NOTE — TOC Progression Note (Addendum)
Transition of Care [facility]) - Progression Note    Patient Details  Name: Lauren [name] MRN: [id number] Date of Birth: 1925-01-09  Transition of Care [facility]) CM/SW Contact  Mahabir, Juliann Pulse, RN Phone Number: 05/09/2020, 9:27 AM  Clinical Narrative: Debby Freiberg chose [facility] for SNF w/PCS-I have tried to call [facility] but she is unavailable until after 10a-will call back-to confirm able to accept, & auth.  1p-d/c summary sent to [facility] rep Loie-accepted,going to rm#[phone],nsg call report tel#[phone].PTAR forms @ printer for packet-to include DNR.PTAR called. No further CM needs.    Expected Discharge Plan: Skilled Nursing Facility Barriers to Discharge: Insurance Authorization  Expected Discharge Plan and Services Expected Discharge Plan: [place]       Living arrangements for the past 2 months: [place] (Memory care) Expected Discharge Date: 05/08/20                                     Social Determinants of Health (SDOH) Interventions    Readmission Risk Interventions No flowsheet data found.

## 2020-05-09 NOTE — Discharge Summary (Signed)
Physician Discharge Summary [facility]    Patient: Lauren [name]                   Admit date: 05/02/2020   DOB: [birth year]             Discharge date:05/09/2020/11:25 AM [id number]                          PCP: Patient, No Pcp Per  Disposition: SNF   Recommendations for Outpatient Follow-up:   . Follow up: in 1 day at the SNF . Under palliative care . Watchful waiting. If no improvement further decline would like to then focus on comfort.   Discharge Condition: Stable   Code Status:   Code Status: DNR  Diet recommendation: Regular healthy diet   Patient was seen and examined, overall no changes to this discharge summary. Please note patient is in the morning is more confused blood pressure gets high with tachycardia but improved with  Her scheduled medications..  Discussed with daughter in detail agrees with current plan.   Discharge Diagnoses:    Principal Problem:   Fracture of femoral neck, right, closed ([place]) Active Problems:   Essential hypertension   Dementia without behavioral disturbance (HCC)   Hypothyroidism   Chronic kidney disease, stage 3a (HCC)   Pneumonia of both lungs due to infectious organism   Mixed hyperlipidemia   Malnutrition of moderate degree   History of Present Illness/ Hospital Course [name] Summary:   87 year old female with past medical history of dementia, hyperlipidemia, hypothyroidism, hyper tension and chronic kidney disease stage IIIa who presents to [facility] emergency department from [place] facility via EMS after patient fell out of bed. Patient is unable to provide a history due to advanced dementia.  Upon evaluation in the emergency department, right hip x-ray revealed a acute nondisplaced right femoral neck fracture. Case was discussed with Dr. Alvan Dame with orthopedic surgery Right hip hemiarthroplasty 05/04/2020  -    Fracture of femoral neck, right, closed  [facility])  Patient suffered a fracture of the right femoral neck after a fall on evening of 05/02/2020  Stable from orthopedic standpoint    Postop day #4  Status post right hip hemiarthroplasty 06/01/2020    Discussed with patient daughter that this is a palliative procedure, not anticipating much improvement post procedure with exception of better pain management-comfort  Daughter expressed understanding and agreed with current plan.  Acute respiratory failure -Continue to improve, O2 demand down from 7 L to 3 L satting 96% -in the setting of possible pneumonia, postop, IV fluids -We will continue DuoNeb treatment, supplemental oxygen -We will keep the patient as comfortable as possible,  -Treat reversible causes such as volume overload, pain, pneumonia  Encephalopathic in setting of advanced dementia -Superimposed by dementia, multifactorial due to pneumonia, hypoxia, medication postop -Mental status continues to wax and wane -At times she is alert follows command but continues to be confused, less agitation   Active Problems:  Pneumonia of both lungs due to infectious organism   Multifocal pneumonia noted on chest x-ray during his presentation similar to chest imaging performed in September 2021 --remains clinically stable but a mild leukocytosis  Continue IV antibiotics for now Rocephin and doxycycline  Hemodynamically stable afebrile normotensive   SARS-CoV-2 negative,   Procalcitonin <0.10  CRP 2.3 Echocardiogram  >> prelim report reviewed  Essential hypertension We will continue as needed hydralazine, resume home  medication of metoprolol -If no response will add as needed labetalol   Dementia without behavioral disturbance (HCC)   Patient exhibiting significant agitation and confusion in the setting of advanced dementia  Attempting to manage patient's pain adequately  Attempting to avoid additional painful stimuli as much as  possible  Fall precautions  Frequent redirection -     Hypothyroidism   Continue home regimen of levothyroxine  Chronic kidney disease, stage 3a (HCC)   Creatinine near baseline, 1.19, 1.04, 0.98  Strict input and output monitoring  Avoiding nephrotoxic agents if at all possible  Monitoring renal function and electrolytes with serial chemistries  Mixed hyperlipidemia   Continue home regimen of statin therapy  Goal of care: -Discussed with daughter at bedside, agreeable to treat reversible cause, may proceed with palliative care She confirmed DNR/DNI status  If no improvement in this hospitalization family is agreeable to comfort care measures, possible hospice    Cultures; Blood Cultures x 2 >> NGT Urine Culture  >>> NGT   Antimicrobials:  IV antibiotics for now Rocephin completed on 05/08/2020 and doxycycline --to be continued for 5 more days  Consultants: Orthopedic team palliative care team   ------------------------------------------------------------------------------------------------------------------------------------  DVT prophylaxis:  SCD/Compression stockings Code Status:   Code Status: DNR  Family Communication: Discussed with daughter in detail- they expressed understanding and agreement of above. -Advance care planning reviewed with her daughter  Dispo: The patient is from: SNF [facility])  Anticipated d/c is to: SNF with palliative care  Anticipated d/c date is: today        Nutritional status:  Nutrition Problem: Moderate Malnutrition Etiology: chronic illness (advanced dementia) Signs/Symptoms: mild fat depletion,mild muscle depletion,moderate fat depletion,moderate muscle depletion Interventions: Ensure Enlive (each supplement provides 350kcal and 20 grams of protein)  The patient's BMI is: Body mass index is 18.57 kg/m. I agree with the assessment and plan as  outlined .... Discharge Instructions:   Discharge Instructions    Activity as tolerated - No restrictions   Complete by: As directed    Activity as tolerated - No restrictions   Complete by: As directed    Diet - low sodium heart healthy   Complete by: As directed    Discharge instructions   Complete by: As directed    Under palliative care Watchful waiting. If no improvement further decline would like to then focus on comfort.   Discharge wound care:   Complete by: As directed    Per instructions   Increase activity slowly   Complete by: As directed    Increase activity slowly   Complete by: As directed    No wound care   Complete by: As directed    Per surgery Ortho recommendations, instructions   Weight bearing as tolerated   Complete by: As directed    Posterior hip precautions.   Laterality: right   Extremity: Lower       Medication List    STOP taking these medications   atorvastatin 10 MG tablet Commonly known as: LIPITOR   bethanechol 10 MG tablet Commonly known as: URECHOLINE   guaiFENesin 600 MG 12 hr tablet Commonly known as: MUCINEX     TAKE these medications   acetaminophen 500 MG tablet Commonly known as: TYLENOL Take 2 tablets (1,000 mg total) by mouth every 8 (eight) hours. What changed:   how much to take  when to take this  reasons to take this   aspirin 81 MG EC tablet Take 1 tablet (81  mg total) by mouth 2 (two) times daily. Swallow whole.   buPROPion 150 MG 24 hr tablet Commonly known as: WELLBUTRIN XL TAKE 1 TABLET DAILY IN THE MORNING FOR ANXIETY What changed: See the new instructions.   docusate sodium 100 MG capsule Commonly known as: COLACE Take 1 capsule (100 mg total) by mouth 2 (two) times daily.   doxycycline 100 MG tablet Commonly known as: VIBRA-TABS Take 1 tablet (100 mg total) by mouth every 12 (twelve) hours for 5 days.   ENSURE PO Take 1 Can by mouth in the morning and at bedtime.   ferrous sulfate 325 (65  FE) MG tablet Take 1 tablet (325 mg total) by mouth 3 (three) times daily after meals.   levothyroxine 100 MCG tablet Commonly known as: SYNTHROID Take 100 mcg by mouth daily before breakfast.   memantine 14 MG Cp24 24 hr capsule Commonly known as: NAMENDA XR TAKE 1 CAPSULE DAILY FOR MEMORY What changed: See the new instructions.   metoprolol tartrate 50 MG tablet Commonly known as: LOPRESSOR TAKE 1 TABLET TWICE A DAY   mirtazapine 7.5 MG tablet Commonly known as: REMERON Take 7.5 mg by mouth at bedtime. What changed: Another medication with the same name was removed. Continue taking this medication, and follow the directions you see here.   OXYGEN Inhale 2 L into the lungs daily as needed (patient comfort).   traMADol 50 MG tablet Commonly known as: Ultram Take 1-2 tablets (50-100 mg total) by mouth every 6 (six) hours as needed.       Follow-up Information    Paralee Cancel, MD. Schedule an appointment as soon as possible for a visit in 2 weeks.   Specialty: Orthopedic Surgery Contact information: [address] [place] [address] [place]  [address] [phone]              Allergies  Allergen Reactions  . Aricept [Donepezil Hcl] Other (See Comments)    Unknown reaction  . Aspirin Other (See Comments)    Upset stomach      Procedures /Studies:   DG Chest 1 View  Result Date: 05/02/2020 CLINICAL DATA:  Fall EXAM: CHEST  1 VIEW COMPARISON:  12/05/2019 FINDINGS: Patchy focus of airspace disease in the right mid lung. Suspected consolidation left base with possible small effusion. Stable cardiomediastinal silhouette. No pneumothorax IMPRESSION: Patchy focus of airspace disease in the right mid lung with suspected patchy consolidation left base, possible pneumonia. Probable small left effusion. Electronically Signed   By: Donavan Foil M.D.   On: 05/02/2020 22:13   DG Pelvis Portable  Result Date: 05/04/2020 CLINICAL DATA:  Status post hemiarthroplasty. EXAM:  PORTABLE PELVIS 1-2 VIEWS COMPARISON:  Radiograph 05/02/2020 FINDINGS: Right hemiarthroplasty in expected alignment. There is no periprosthetic lucency or fracture. Recent postsurgical change includes air and edema in the soft tissues. IMPRESSION: Right hemiarthroplasty without immediate postoperative complication. Electronically Signed   By: Keith Rake M.D.   On: 05/04/2020 22:41   DG Hip Unilat W or Wo Pelvis 2-3 Views Right  Result Date: 05/02/2020 CLINICAL DATA:  Fall EXAM: DG HIP (WITH OR WITHOUT PELVIS) 2-3V RIGHT COMPARISON:  None. FINDINGS: SI joints are non widened. Pubic symphysis appears intact. Acute nondisplaced right femoral neck fracture. No femoral head dislocation. IMPRESSION: Acute nondisplaced right femoral neck fracture. Electronically Signed   By: Donavan Foil M.D.   On: 05/02/2020 22:12    Subjective:   Patient was seen and examined 05/09/2020, 11:25 AM Patient stable today. No acute distress.  No issues overnight Stable for discharge.  Discharge Exam:    Vitals:   05/08/20 2000 05/09/20 0000 05/09/20 0400 05/09/20 0600  BP: (!) 144/65   (!) 191/89  Pulse: 83   83  Resp: (!) 21   (!) 24  Temp: 98.9 F (37.2 C) 98.8 F (37.1 C) 98.4 F (36.9 C)   TempSrc: Oral Axillary Axillary   SpO2: 96%   92%  Weight:      Height:        General: Pt lying comfortably in bed & appears in no obvious distress. Cardiovascular: S1 & S2 heard, RRR, S1/S2 +. No murmurs, rubs, gallops or clicks. No JVD or pedal edema. Respiratory: Clear to auscultation without wheezing, rhonchi or crackles. No increased work of breathing. Abdominal:  Non-distended, non-tender & soft. No organomegaly or masses appreciated. Normal bowel sounds heard. CNS: Alert and oriented. No focal deficits. Extremities: no edema, no cyanosis      The results of significant diagnostics from this hospitalization (including imaging, microbiology, ancillary and laboratory) are listed below for reference.       Microbiology:   Recent Results (from the past 240 hour(s))  SARS CORONAVIRUS 2 (TAT 6-24 HRS) Nasopharyngeal Nasopharyngeal Swab     Status: None   Collection Time: 05/02/20 10:33 PM   Specimen: Nasopharyngeal Swab  Result Value Ref Range Status   SARS Coronavirus 2 NEGATIVE NEGATIVE Final    Comment: (NOTE) SARS-CoV-2 target nucleic acids are NOT DETECTED.  The SARS-CoV-2 RNA is generally detectable in upper and lower respiratory specimens during the acute phase of infection. Negative results do not preclude SARS-CoV-2 infection, do not rule out co-infections with other pathogens, and should not be used as the sole basis for treatment or other patient management decisions. Negative results must be combined with clinical observations, patient history, and epidemiological information. The expected result is Negative.  Fact Sheet for Patients: SugarRoll.be  Fact Sheet for Healthcare Providers: https://www.woods-mathews.com/  This test is not yet approved or cleared by the [place] FDA and  has been authorized for detection and/or diagnosis of SARS-CoV-2 by FDA under an Emergency Use Authorization (EUA). This EUA will remain  in effect (meaning this test can be used) for the duration of the COVID-19 declaration under Se ction 564(b)(1) of the Act, 21 U.S.C. section 360bbb-3(b)(1), unless the authorization is terminated or revoked sooner.  Performed at [facility], [place] [address], [place], [place] [address]   Resp Panel by RT-PCR (Flu A&B, Covid) Nasopharyngeal Swab     Status: None   Collection Time: 05/03/20 12:44 AM   Specimen: Nasopharyngeal Swab; Nasopharyngeal(NP) swabs in vial transport medium  Result Value Ref Range Status   SARS Coronavirus 2 by RT PCR NEGATIVE NEGATIVE Final    Comment: (NOTE) SARS-CoV-2 target nucleic acids are NOT DETECTED.  The SARS-CoV-2 RNA is generally detectable in upper  respiratory specimens during the acute phase of infection. The lowest concentration of SARS-CoV-2 viral copies this assay can detect is 138 copies/mL. A negative result does not preclude SARS-Cov-2 infection and should not be used as the sole basis for treatment or other patient management decisions. A negative result may occur with  improper specimen collection/handling, submission of specimen other than nasopharyngeal swab, presence of viral mutation(s) within the areas targeted by this assay, and inadequate number of viral copies(<138 copies/mL). A negative result must be combined with clinical observations, patient history, and epidemiological information. The expected result is Negative.  Fact Sheet for Patients:  EntrepreneurPulse.com.au  Fact Sheet for Healthcare Providers:  IncredibleEmployment.be  This test is no t yet approved or cleared by the [place] FDA and  has been authorized for detection and/or diagnosis of SARS-CoV-2 by FDA under an Emergency Use Authorization (EUA). This EUA will remain  in effect (meaning this test can be used) for the duration of the COVID-19 declaration under Section 564(b)(1) of the Act, 21 U.S.C.section 360bbb-3(b)(1), unless the authorization is terminated  or revoked sooner.       Influenza A by PCR NEGATIVE NEGATIVE Final   Influenza B by PCR NEGATIVE NEGATIVE Final    Comment: (NOTE) The Xpert Xpress SARS-CoV-2/FLU/RSV plus assay is intended as an aid in the diagnosis of influenza from Nasopharyngeal swab specimens and should not be used as a sole basis for treatment. Nasal washings and aspirates are unacceptable for Xpert Xpress SARS-CoV-2/FLU/RSV testing.  Fact Sheet for Patients: EntrepreneurPulse.com.au  Fact Sheet for Healthcare Providers: IncredibleEmployment.be  This test is not yet approved or cleared by the [place] FDA and has been  authorized for detection and/or diagnosis of SARS-CoV-2 by FDA under an Emergency Use Authorization (EUA). This EUA will remain in effect (meaning this test can be used) for the duration of the COVID-19 declaration under Section 564(b)(1) of the Act, 21 U.S.C. section 360bbb-3(b)(1), unless the authorization is terminated or revoked.  Performed at [facility], [place] [address], [place], [place] [address]   Culture, blood (routine x 2)     Status: None   Collection Time: 05/03/20  5:28 AM   Specimen: BLOOD LEFT HAND  Result Value Ref Range Status   Specimen Description   Final    BLOOD LEFT HAND Performed at [place] [address] [place], [place] [address]    Special Requests   Final    BOTTLES DRAWN AEROBIC ONLY Blood Culture adequate volume Performed at [place] [address], [place], [place] [address]    Culture   Final    NO GROWTH 5 DAYS Performed at [facility], [place] [address] [place], [place] [address]    Report Status 05/08/2020 FINAL  Final  Culture, blood (routine x 2)     Status: None   Collection Time: 05/03/20  5:28 AM   Specimen: BLOOD RIGHT HAND  Result Value Ref Range Status   Specimen Description   Final    BLOOD RIGHT HAND Performed at [place] [address] [place], [place] [address]    Special Requests   Final    BOTTLES DRAWN AEROBIC ONLY Blood Culture adequate volume Performed at [place] [address] [place], [place] [address]    Culture   Final    NO GROWTH 5 DAYS Performed at [facility], [place] [address] [place], [place] [address]    Report Status 05/08/2020 FINAL  Final  SARS CORONAVIRUS 2 (TAT 6-24 HRS) Nasopharyngeal Nasopharyngeal Swab     Status: None   Collection Time: 05/08/20 12:58 PM   Specimen: Nasopharyngeal Swab  Result Value Ref Range Status   SARS Coronavirus 2 NEGATIVE NEGATIVE Final     Comment: (NOTE) SARS-CoV-2 target nucleic acids are NOT DETECTED.  The SARS-CoV-2 RNA is generally detectable in upper and lower respiratory specimens during the acute phase of infection. Negative results do not preclude SARS-CoV-2 infection, do not rule out co-infections with other pathogens, and should not be used as the sole basis for treatment or  other patient management decisions. Negative results must be combined with clinical observations, patient history, and epidemiological information. The expected result is Negative.  Fact Sheet for Patients: SugarRoll.be  Fact Sheet for Healthcare Providers: https://www.woods-mathews.com/  This test is not yet approved or cleared by the [place] FDA and  has been authorized for detection and/or diagnosis of SARS-CoV-2 by FDA under an Emergency Use Authorization (EUA). This EUA will remain  in effect (meaning this test can be used) for the duration of the COVID-19 declaration under Se ction 564(b)(1) of the Act, 21 U.S.C. section 360bbb-3(b)(1), unless the authorization is terminated or revoked sooner.  Performed at [facility], [place] [address], [place], [place] [address]      Labs:   CBC: Recent Labs  Lab 05/02/20 2231 05/03/20 6045 05/05/20 4098 05/06/20 0552 05/07/20 0533 05/08/20 0509 05/09/20 0546  WBC 10.6* 16.0* 12.8* 10.9* 9.6 10.9* 9.4  NEUTROABS 8.7* 14.0*  --   --   --   --   --   HGB 13.1 13.7 11.7* 10.3* 10.3* 10.4* 10.3*  HCT 41.8 43.5 36.6 32.1* 32.0* 32.9* 32.7*  MCV 97.7 98.2 96.8 97.3 96.7 97.6 97.0  PLT 189 170 123* 114* 139* 143* 119   Basic Metabolic Panel: Recent Labs  Lab 05/05/20 0433 05/06/20 0552 05/07/20 0533 05/08/20 0509 05/09/20 0546  NA 142 140 142 143 143  K 3.7 3.2* 3.0* 3.1* 3.5  CL 105 104 105 106 106  CO2 25 25 26 26 26   GLUCOSE 147* 106* 101* 107* 104*  BUN 25* 29* 31* 34* 28*  CREATININE 0.98 1.19* 1.02* 0.92 0.84   CALCIUM 7.1* 6.9* 6.9* 7.3* 7.7*  MG 1.8  --   --  2.6*  --    Liver Function Tests: Recent Labs  Lab 05/02/20 2231 05/03/20 0528  AST 36 41  ALT 32 35  ALKPHOS 101 107  BILITOT 0.7 1.0  PROT 6.8 7.0  ALBUMIN 3.9 3.8   BNP (last 3 results) Recent Labs    05/03/20 0528  BNP 753.5*   Cardiac Enzymes: No results for input(s): CKTOTAL, CKMB, CKMBINDEX, TROPONINI in the last 168 hours. CBG: Recent Labs  Lab 05/05/20 1228  GLUCAP 101*   Hgb A1c No results for input(s): HGBA1C in the last 72 hours. Lipid Profile No results for input(s): CHOL, HDL, LDLCALC, TRIG, CHOLHDL, LDLDIRECT in the last 72 hours. Thyroid function studies No results for input(s): TSH, T4TOTAL, T3FREE, THYROIDAB in the last 72 hours.  Invalid input(s): FREET3 Anemia work up No results for input(s): VITAMINB12, FOLATE, FERRITIN, TIBC, IRON, RETICCTPCT in the last 72 hours. Urinalysis    Component Value Date/Time   COLORURINE YELLOW 05/02/2020 2233   APPEARANCEUR CLEAR 05/02/2020 2233   LABSPEC 1.014 05/02/2020 2233   PHURINE 6.0 05/02/2020 2233   GLUCOSEU NEGATIVE 05/02/2020 2233   HGBUR NEGATIVE 05/02/2020 2233   BILIRUBINUR NEGATIVE 05/02/2020 2233   KETONESUR NEGATIVE 05/02/2020 2233   PROTEINUR 30 (A) 05/02/2020 2233   UROBILINOGEN 0.2 02/12/2014 1015   NITRITE NEGATIVE 05/02/2020 2233   LEUKOCYTESUR NEGATIVE 05/02/2020 2233         Time coordinating discharge: Over 45 minutes  SIGNED: Deatra James, MD, FACP, FHM. [facility],  Please use [email or web address] to Page If 7PM-7AM, please contact night-coverage [email or web address] Ota [facility] 05/09/2020, 11:25 AM

## 2020-05-09 NOTE — Progress Notes (Signed)
Nutrition Follow-up  DOCUMENTATION CODES:   Non-severe (moderate) malnutrition in context of chronic illness  INTERVENTION:  - continue Ensure Enlive BID.  NUTRITION DIAGNOSIS:   Moderate Malnutrition related to chronic illness (advanced dementia) as evidenced by mild fat depletion,mild muscle depletion,moderate fat depletion,moderate muscle depletion. -ongoing  GOAL:   Patient will meet greater than or equal to 90% of their needs -likely minimally met at this time.   MONITOR:   PO intake,Supplement acceptance,Labs,Weight trends  ASSESSMENT:   87 year old female with medical history of advanced dementia, HLD, hypothyroidism, HTN, and stage 3 CKD. She presented to the ED from [facility] skilled nursing facility via EMS after patient fell out of bed. In the ED, R hip xray indicated nondisplaced R femoral neck fx.  Patient is noted to be a/o to self only. No intakes documented since admission other than 0% of meal at 1100 today. Ensure Enlive ordered BID and she has accepted this supplement nearly 100% of the time.   Discharge order and discharge summary for d/c to SNF entered yesterday and again today.   She has not been weighed since admission on 2/16. No information documented in the edema section of flow sheet.    Labs reviewed; BUN: 28 mg/dl, Ca: 7.7 mg/dl. Medications reviewed; 100 mg colace BID, 325 mg ferrous sulfate TID, 100 mcg oral synthroid/day, 40 mEq Klor-Con/day.    Diet Order:   Diet Order            Diet - low sodium heart healthy           DIET SOFT Room service appropriate? Yes; Fluid consistency: Thin  Diet effective now                 EDUCATION NEEDS:   No education needs have been identified at this time  Skin:  Skin Assessment: Skin Integrity Issues: Skin Integrity Issues:: Incisions Incisions: R hip (2/17)  Last BM:  unknown  Height:   Ht Readings from Last 1 Encounters:  05/03/20 '5\' 8"'  (1.727 m)    Weight:   Wt Readings  from Last 1 Encounters:  05/03/20 55.4 kg     Estimated Nutritional Needs:  Kcal:  1390-1600 kcal Protein:  60-75 grams Fluid:  >/= 1.8 L/day      Jarome Matin, MS, RD, LDN, CNSC Inpatient Clinical Dietitian RD pager # available in AMION  After hours/weekend pager # available in [facility]

## 2020-05-09 NOTE — TOC Progression Note (Addendum)
Transition of Care [facility]) - Progression Note    Patient Details  Name: KARLE DESROSIER MRN: [id number] Date of Birth: [birth year]  [facility] of [facility]) CM/SW Contact  Nakyra Bourn, Juliann Pulse, RN Phone Number: 05/09/2020, 3:25 PM  Clinical Narrative:   Dtr has decided not to accept [facility]-she is appealing her d/c-provided dtr [name] with medicare appeal tel#, await appeal process.Nsg informed to cancel PTAR. MD updated. Provdided dtr athy with additional bed offers-Brian Bl[facility] to [facility] [place] [facility] rep Federico Flake does not have abed available until Thursday. Informed dtr [name] we are awaiting for appeal process.MD updated.   Expected Discharge Plan: Skilled Nursing Facility Barriers to Discharge: Insurance Authorization  Expected Discharge Plan and Services Expected Discharge Plan: [place]       Living arrangements for the past 2 months: [place] (Memory care) Expected Discharge Date: 05/09/20                                     Social Determinants of Health (SDOH) Interventions    Readmission Risk Interventions No flowsheet data found.

## 2020-05-10 LAB — COMPREHENSIVE METABOLIC PANEL
ALT: 17 U/L (ref 0–44)
AST: 23 U/L (ref 15–41)
Albumin: 2.8 g/dL — ABNORMAL LOW (ref 3.5–5.0)
Alkaline Phosphatase: 89 U/L (ref 38–126)
Anion gap: 11 (ref 5–15)
BUN: 25 mg/dL — ABNORMAL HIGH (ref 8–23)
CO2: 27 mmol/L (ref 22–32)
Calcium: 8 mg/dL — ABNORMAL LOW (ref 8.9–10.3)
Chloride: 103 mmol/L (ref 98–111)
Creatinine, Ser: 0.81 mg/dL (ref 0.44–1.00)
GFR, Estimated: >60 mL/min (ref >60)
Glucose, Bld: 107 mg/dL — ABNORMAL HIGH (ref 70–99)
Potassium: 3.6 mmol/L (ref 3.5–5.1)
Sodium: 141 mmol/L (ref 135–145)
Total Bilirubin: 0.8 mg/dL (ref 0.3–1.2)
Total Protein: 5.9 g/dL — ABNORMAL LOW (ref 6.5–8.1)

## 2020-05-10 LAB — CBC WITH DIFFERENTIAL/PLATELET
Abs Immature Granulocytes: 0.16 10*3/uL — ABNORMAL HIGH (ref 0.00–0.07)
Basophils Absolute: 0 10*3/uL (ref 0.0–0.1)
Basophils Relative: 0 %
Eosinophils Absolute: 0.4 10*3/uL (ref 0.0–0.5)
Eosinophils Relative: 4 %
HCT: 35.7 % — ABNORMAL LOW (ref 36.0–46.0)
Hemoglobin: 11.2 g/dL — ABNORMAL LOW (ref 12.0–15.0)
Immature Granulocytes: 2 %
Lymphocytes Relative: 14 %
Lymphs Abs: 1.3 10*3/uL (ref 0.7–4.0)
MCH: 31 pg (ref 26.0–34.0)
MCHC: 31.4 g/dL (ref 30.0–36.0)
MCV: 98.9 fL (ref 80.0–100.0)
Monocytes Absolute: 1.1 10*3/uL — ABNORMAL HIGH (ref 0.1–1.0)
Monocytes Relative: 11 %
Neutro Abs: 6.6 10*3/uL (ref 1.7–7.7)
Neutrophils Relative %: 69 %
Platelets: 229 10*3/uL (ref 150–400)
RBC: 3.61 MIL/uL — ABNORMAL LOW (ref 3.87–5.11)
RDW: 15.3 % (ref 11.5–15.5)
WBC: 9.5 10*3/uL (ref 4.0–10.5)
nRBC: 0 % (ref 0.0–0.2)

## 2020-05-10 LAB — ECHOCARDIOGRAM COMPLETE
AV Vena cont: 0.2 cm
Area-P 1/2: 5.38 cm2
Height: 68 in
P 1/2 time: 349 msec
S' Lateral: 3 cm
Weight: 1954.16 oz

## 2020-05-10 LAB — MAGNESIUM: Magnesium: 2.2 mg/dL (ref 1.7–2.4)

## 2020-05-10 LAB — PHOSPHORUS: Phosphorus: 3.4 mg/dL (ref 2.5–4.6)

## 2020-05-10 NOTE — Care Management Important Message (Signed)
Important Message  Patient Details  Name: CHANI GHANEM MRN: [id number] Date of Birth: 12-04-1924   Medicare Important Message Given:  Yes  [employer] Appeal Detailed Notice of Discharge letter created and saved: Yes Detailed Notice of Discharge Document Given to [place]: Yes (Verbal given by Dessa Phi and copy placed in Patient's room) [employer] ROI Document Created: Yes [employer] appeal documents uploaded to [employer] stite: Yes    [name] 05/10/2020, 10:42 AM

## 2020-05-10 NOTE — Progress Notes (Addendum)
PROGRESS NOTE    Lauren [name]  SVX:[id number] DOB: 1924/08/24 DOA: 05/02/2020 PCP: Patient, No Pcp Per   Brief Narrative:  The patient is a 87 year old Caucasian female with a past medical history significant for but not limited to death, dementia, hyperlipidemia, hypothyroidism, hypertension, chronic kidney disease stage IIIa as well as other comorbidities who presented to [facility] long hospital ED from [facility] facility after EMS was called because the patient fell out of bed.  Because of her advanced dementia she was unable to provide a subjective history and in the ED right hip x-ray revealed an acute nondisplaced right hip fracture.  Hospitalization has also been complicated by acute respiratory failure with hypoxia in the setting of pneumonia which she was adequately treated for.  Antibiotics have now been been stopped.  Case was discussed with Dr. Alvan Dame of orthopedic surgery and she was taken for a right hip hemiarthroplasty on 05/04/2020.  She is postoperative day 5 now and subsequently is doing well and was deemed stable to be discharged.  Please see full discharge summary details by Dr. Lurlean Leyden on 05/09/20.  The plan was for the patient to go to SNF with palliative to follow.  She is going to be discharged yesterday to a [facility] and report was to be called however daughter decided not to accept according to SNF bed and appealed her discharge process wanting a different SNF facility.  Currently awaiting appeal process outcome and SNF bed [facility] in [place], [place] and bed will be available tomorrow..   Assessment & Plan:   Principal Problem:   Fracture of femoral neck, right, closed ([place]) Active Problems:   Essential hypertension   Dementia without behavioral disturbance (HCC)   Hypothyroidism   Chronic kidney disease, stage 3a (HCC)   Pneumonia of both lungs due to infectious organism   Mixed hyperlipidemia   Malnutrition of moderate  degree  Fracture of femoral neck, right, closed ([employer]) -Patient suffered a fracture of the right femoral neck after a fall on evening of 05/02/2020 -Stable from orthopedic standpoint -Postop day #5 -Status post Right hip hemiarthroplasty 06/01/2020 -Dr. Roger Shelter iscussed with patient daughter that this is a palliative procedure, not anticipating much improvement post procedure with exception of better pain management-comfort -Daughter expressed understanding and agreed with current plan but decided not to accept initial SNF bed and appealed Discharge Process as she wants a Different SNF now; Currently awaiting appeal process outcome and new SNF bed availability  Acute Respiratory Failure with Hypoxia in the setting of Multifocal PNA -Continue to improve and weaned off of O2 -SpO2: 99 % O2 Flow Rate (L/min): 2 L/min -In the setting of possible pneumonia, postop, IV fluids -We will continue DuoNeb treatment, supplemental oxygen -We will keep the patient as comfortable as possible,  -Treat reversible causes such as volume overload, pain, pneumonia -Off of supplemental O2 now  Encephalopathic in setting of advanced dementia -Superimposed by dementia, multifactorial due to pneumonia, hypoxia, medication postop -Mental status continues to wax and wane and has confusion -At times she is alert follows command but continues to be confused,less agitation -Continues to remove IV and Telemetry   Pneumonia of both lungs due to infectious organism -Multifocal pneumonia noted on chest x-ray during his presentation similar to chest imaging performed in September 2021 --remains clinically stable but a mild leukocytosis -ContinuedIV antibiotics Rocephin and doxycycline but now stopped  -Hemodynamically stable afebrile normotensive -SARS-CoV-2 negative,  -Procalcitonin <0.10 -CRP 2.3 -Echocardiogram >>prelim report reviewed  Essential Hypertension -We will continue as needed hydralazine,  resume home medication of metoprolol -If no response will add as needed labetalol -Continue to Monitor BP per protocol -Last BP was 158/75  Dementia without Behavioral Disturbance (HCC) -Patient exhibiting significant agitation and confusion in the setting of advanced dementia -Attempting to manage patient's pain adequately -Attempting to avoid additional painful stimuli as much as possible -Fall precautions -Frequent redirection  Hypothyroidism -Continue home regimen of Levothyroxine  Chronic kidney disease, stage 3a (HCC) -Creatinine near baseline,BUN/Cr is now 25/0.81 -Strict input and output monitoring -Avoiding nephrotoxic agents, contrast dyes, and Hypotension if at all possible -Will not be Monitoring renal function and electrolytes anymore as she is stable to D/C  Mixed hyperlipidemia -Continue home regimen of statin therapy  Normocytic Anemia -Patient's Hgb/Hct is stable at 11.2/35.7 -Check Anemia Panel in the outpatient setting -Continue to Monitor for S/Sx of Bleeding; Currently no overt bleeding noted -Repeat CBC at SNF  Moderate Malnutrition in the context of Chronic illness -Nutritionist  Consulted and recommending Ensure Enlive po BID   Goal of care: -Dr. Richarda Blade discussed with daughter at bedside, agreeable to treat reversible cause, may proceed with palliative care -She confirmed DNR/DNI status -If no improvement in this hospitalization family is agreeable to comfort care measures, possible hospice but plan is for SNF with palliative to Follow   DVT prophylaxis: SCDs; Per Orthopedic Surgery ASA 81 mg po BID Code Status: DO NOT RESUSCITATE Family Communication: No family present at bedside  Disposition Plan: SNF as she is medically stable to D/C   Status is: Inpatient  Remains inpatient appropriate because:Unsafe d/c plan, IV treatments appropriate due to intensity of illness or inability to take PO and Inpatient level of care appropriate due to  severity of illness   Dispo: The patient is from: SNF              Anticipated d/c is to: SNF              Anticipated d/c date is: 1 day              Patient currently is medically stable to d/c.   Difficult to place patient No  Consultants:   Orthopedic Surgery  Palliative Care Medicine     Procedures:  Right hip hemiarthroplasty utilizing DePuy component, size 8 standard Actis stem with a 49 unipolar ball with a +0 adapter.  Antimicrobials:  Anti-infectives (From admission, onward)   Start     Dose/Rate Route Frequency Ordered Stop   05/08/20 0000  doxycycline (VIBRA-TABS) 100 MG tablet        100 mg Oral Every 12 hours 05/08/20 0926 05/13/20 2359   05/05/20 1000  doxycycline (VIBRA-TABS) tablet 100 mg  Status:  Discontinued        100 mg Oral Every 12 hours 05/05/20 0902 05/10/20 1116   05/04/20 2300  ceFAZolin (ANCEF) IVPB 2g/100 mL premix        2 g 200 mL/hr over 30 Minutes Intravenous Every 6 hours 05/04/20 2200 05/05/20 0836   05/04/20 1630  ceFAZolin (ANCEF) IVPB 2g/100 mL premix        2 g 200 mL/hr over 30 Minutes Intravenous On call to O.R. 05/04/20 1622 05/04/20 1750   05/03/20 2200  cefTRIAXone (ROCEPHIN) 2 g in sodium chloride 0.9 % 100 mL IVPB  Status:  Discontinued        2 g 200 mL/hr over 30 Minutes Intravenous Every 24 hours 05/03/20 0136 05/03/20 0758  05/03/20 2200  cefTRIAXone (ROCEPHIN) 1 g in sodium chloride 0.9 % 100 mL IVPB  Status:  Discontinued        1 g 200 mL/hr over 30 Minutes Intravenous Every 24 hours 05/03/20 0800 05/10/20 1116   05/03/20 1000  doxycycline (VIBRAMYCIN) 100 mg in sodium chloride 0.9 % 250 mL IVPB  Status:  Discontinued        100 mg 125 mL/hr over 120 Minutes Intravenous Every 12 hours 05/03/20 0136 05/03/20 0758   05/03/20 1000  doxycycline (VIBRAMYCIN) 100 mg in sodium chloride 0.9 % 250 mL IVPB  Status:  Discontinued        100 mg 125 mL/hr over 120 Minutes Intravenous Every 12 hours 05/03/20 0800 05/05/20 0902    05/02/20 2315  cefTRIAXone (ROCEPHIN) 1 g in sodium chloride 0.9 % 100 mL IVPB        1 g 200 mL/hr over 30 Minutes Intravenous  Once 05/02/20 2305 05/03/20 0054   05/02/20 2315  doxycycline (VIBRA-TABS) tablet 100 mg        100 mg Oral  Once 05/02/20 2305 05/02/20 2315        Subjective: Seen and examined at bedside and she remains confused.  Ripped out her IV and continues to take off telemetry.  Does not know why she is in the hospital as she is got advanced dementia.  Does not seem to be in any acute distress.  No other concerns or complaints at this time.  Objective: Vitals:   05/09/20 1200 05/09/20 1430 05/09/20 2123 05/10/20 0455  BP: (!) 154/68 (!) 173/75 (!) 169/74 (!) 158/75  Pulse:  70 76 67  Resp:  (!) 22 (!) 22 17  Temp:  98.4 F (36.9 C) 98.1 F (36.7 C) 98.3 F (36.8 C)  TempSrc:  Axillary Oral Oral  SpO2:  97% 97% 99%  Weight:      Height:        Intake/Output Summary (Last 24 hours) at 05/10/2020 1242 Last data filed at 05/10/2020 0400 Gross per 24 hour  Intake 120 ml  Output 925 ml  Net -805 ml   Filed Weights   05/03/20 0143  Weight: 55.4 kg   Examination: Physical Exam:  Constitutional: Thin elderly demented Caucasian female currently in no acute distress appears calm Eyes: Lids and conjunctivae normal, sclerae anicteric  ENMT: External Ears, Nose appear normal. Grossly normal hearing. Neck: Appears normal, supple, no cervical masses, normal ROM, no appreciable thyromegaly,; no JVD Respiratory: Diminished to auscultation bilaterally, no wheezing, rales, rhonchi or crackles. Normal respiratory effort and patient is not tachypenic. No accessory muscle use.  Unlabored breathing Cardiovascular: RRR, no murmurs / rubs / gallops. S1 and S2 auscultated.  No appreciable extremity edema Abdomen: Soft, non-tender, non-distended.  Bowel sounds positive.  GU: Deferred. Musculoskeletal: No clubbing / cyanosis of digits/nails. No joint deformity upper and lower  extremities.  Skin: No rashes, lesions, ulcers on limited skin evaluation. No induration; Warm and dry.  Neurologic: CN 2-12 grossly intact with no focal deficits but remains confused Psychiatric: Impaired judgment and insight.  She is awake but not fully alert and oriented x 3. Normal mood and appropriate affect.   Data Reviewed: I have personally reviewed following labs and imaging studies  CBC: Recent Labs  Lab 05/06/20 0552 05/07/20 0533 05/08/20 0509 05/09/20 0546 05/10/20 0832  WBC 10.9* 9.6 10.9* 9.4 9.5  NEUTROABS  --   --   --   --  6.6  HGB 10.3* 10.3*  10.4* 10.3* 11.2*  HCT 32.1* 32.0* 32.9* 32.7* 35.7*  MCV 97.3 96.7 97.6 97.0 98.9  PLT 114* 139* 143* 174 628   Basic Metabolic Panel: Recent Labs  Lab 05/05/20 0433 05/06/20 0552 05/07/20 0533 05/08/20 0509 05/09/20 0546 05/10/20 0832  NA 142 140 142 143 143 141  K 3.7 3.2* 3.0* 3.1* 3.5 3.6  CL 105 104 105 106 106 103  CO2 25 25 26 26 26 27   GLUCOSE 147* 106* 101* 107* 104* 107*  BUN 25* 29* 31* 34* 28* 25*  CREATININE 0.98 1.19* 1.02* 0.92 0.84 0.81  CALCIUM 7.1* 6.9* 6.9* 7.3* 7.7* 8.0*  MG 1.8  --   --  2.6*  --  2.2  PHOS  --   --   --   --   --  3.4   GFR: Estimated Creatinine Clearance: 37.1 mL/min (by C-G formula based on SCr of 0.81 mg/dL). Liver Function Tests: Recent Labs  Lab 05/10/20 0832  AST 23  ALT 17  ALKPHOS 89  BILITOT 0.8  PROT 5.9*  ALBUMIN 2.8*   No results for input(s): LIPASE, AMYLASE in the last 168 hours. No results for input(s): AMMONIA in the last 168 hours. Coagulation Profile: No results for input(s): INR, PROTIME in the last 168 hours. Cardiac Enzymes: No results for input(s): CKTOTAL, CKMB, CKMBINDEX, TROPONINI in the last 168 hours. BNP (last 3 results) No results for input(s): PROBNP in the last 8760 hours. HbA1C: No results for input(s): HGBA1C in the last 72 hours. CBG: Recent Labs  Lab 05/05/20 1228  GLUCAP 101*   Lipid Profile: No results for  input(s): CHOL, HDL, LDLCALC, TRIG, CHOLHDL, LDLDIRECT in the last 72 hours. Thyroid Function Tests: No results for input(s): TSH, T4TOTAL, FREET4, T3FREE, THYROIDAB in the last 72 hours. Anemia Panel: No results for input(s): VITAMINB12, FOLATE, FERRITIN, TIBC, IRON, RETICCTPCT in the last 72 hours. Sepsis Labs: No results for input(s): PROCALCITON, LATICACIDVEN in the last 168 hours.  Recent Results (from the past 240 hour(s))  SARS CORONAVIRUS 2 (TAT 6-24 HRS) Nasopharyngeal Nasopharyngeal Swab     Status: None   Collection Time: 05/02/20 10:33 PM   Specimen: Nasopharyngeal Swab  Result Value Ref Range Status   SARS Coronavirus 2 NEGATIVE NEGATIVE Final    Comment: (NOTE) SARS-CoV-2 target nucleic acids are NOT DETECTED.  The SARS-CoV-2 RNA is generally detectable in upper and lower respiratory specimens during the acute phase of infection. Negative results do not preclude SARS-CoV-2 infection, do not rule out co-infections with other pathogens, and should not be used as the sole basis for treatment or other patient management decisions. Negative results must be combined with clinical observations, patient history, and epidemiological information. The expected result is Negative.  Fact Sheet for Patients: SugarRoll.be  Fact Sheet for Healthcare Providers: https://www.woods-mathews.com/  This test is not yet approved or cleared by the [place] FDA and  has been authorized for detection and/or diagnosis of SARS-CoV-2 by FDA under an Emergency Use Authorization (EUA). This EUA will remain  in effect (meaning this test can be used) for the duration of the COVID-19 declaration under Se ction 564(b)(1) of the Act, 21 U.S.C. section 360bbb-3(b)(1), unless the authorization is terminated or revoked sooner.  Performed at [facility], [place] [address] [place], [place] [address]   Resp Panel by RT-PCR (Flu A&B, Covid)  Nasopharyngeal Swab     Status: None   Collection Time: 05/03/20 12:44 AM   Specimen: Nasopharyngeal Swab; Nasopharyngeal(NP) swabs in  vial transport medium  Result Value Ref Range Status   SARS Coronavirus 2 by RT PCR NEGATIVE NEGATIVE Final    Comment: (NOTE) SARS-CoV-2 target nucleic acids are NOT DETECTED.  The SARS-CoV-2 RNA is generally detectable in upper respiratory specimens during the acute phase of infection. The lowest concentration of SARS-CoV-2 viral copies this assay can detect is 138 copies/mL. A negative result does not preclude SARS-Cov-2 infection and should not be used as the sole basis for treatment or other patient management decisions. A negative result may occur with  improper specimen collection/handling, submission of specimen other than nasopharyngeal swab, presence of viral mutation(s) within the areas targeted by this assay, and inadequate number of viral copies(<138 copies/mL). A negative result must be combined with clinical observations, patient history, and epidemiological information. The expected result is Negative.  Fact Sheet for Patients:  EntrepreneurPulse.com.au  Fact Sheet for Healthcare Providers:  IncredibleEmployment.be  This test is no t yet approved or cleared by the [place] FDA and  has been authorized for detection and/or diagnosis of SARS-CoV-2 by FDA under an Emergency Use Authorization (EUA). This EUA will remain  in effect (meaning this test can be used) for the duration of the COVID-19 declaration under Section 564(b)(1) of the Act, 21 U.S.C.section 360bbb-3(b)(1), unless the authorization is terminated  or revoked sooner.       Influenza A by PCR NEGATIVE NEGATIVE Final   Influenza B by PCR NEGATIVE NEGATIVE Final    Comment: (NOTE) The Xpert Xpress SARS-CoV-2/FLU/RSV plus assay is intended as an aid in the diagnosis of influenza from Nasopharyngeal swab specimens and should not be  used as a sole basis for treatment. Nasal washings and aspirates are unacceptable for Xpert Xpress SARS-CoV-2/FLU/RSV testing.  Fact Sheet for Patients: EntrepreneurPulse.com.au  Fact Sheet for Healthcare Providers: IncredibleEmployment.be  This test is not yet approved or cleared by the [place] FDA and has been authorized for detection and/or diagnosis of SARS-CoV-2 by FDA under an Emergency Use Authorization (EUA). This EUA will remain in effect (meaning this test can be used) for the duration of the COVID-19 declaration under Section 564(b)(1) of the Act, 21 U.S.C. section 360bbb-3(b)(1), unless the authorization is terminated or revoked.  Performed at [facility], [place] [address], [place], [place] [address]   Culture, blood (routine x 2)     Status: None   Collection Time: 05/03/20  5:28 AM   Specimen: BLOOD LEFT HAND  Result Value Ref Range Status   Specimen Description   Final    BLOOD LEFT HAND Performed at [facility] [address], [place], [place] [address]    Special Requests   Final    BOTTLES DRAWN AEROBIC ONLY Blood Culture adequate volume Performed at [facility] [address], [place], [place] [address]    Culture   Final    NO GROWTH 5 DAYS Performed at [facility], [place] [address], [place], [place] [address]    Report Status 05/08/2020 FINAL  Final  Culture, blood (routine x 2)     Status: None   Collection Time: 05/03/20  5:28 AM   Specimen: BLOOD RIGHT HAND  Result Value Ref Range Status   Specimen Description   Final    BLOOD RIGHT HAND Performed at [facility] [address] [place], [place] [address]    Special Requests   Final    BOTTLES DRAWN AEROBIC ONLY Blood Culture adequate volume Performed at [facility]  [place] [address], [place], [place] [address]    Culture   Final    NO GROWTH 5  DAYS Performed at [facility], [place] [address], [place], [place] [address]    Report Status 05/08/2020 FINAL  Final  SARS CORONAVIRUS 2 (TAT 6-24 HRS) Nasopharyngeal Nasopharyngeal Swab     Status: None   Collection Time: 05/08/20 12:58 PM   Specimen: Nasopharyngeal Swab  Result Value Ref Range Status   SARS Coronavirus 2 NEGATIVE NEGATIVE Final    Comment: (NOTE) SARS-CoV-2 target nucleic acids are NOT DETECTED.  The SARS-CoV-2 RNA is generally detectable in upper and lower respiratory specimens during the acute phase of infection. Negative results do not preclude SARS-CoV-2 infection, do not rule out co-infections with other pathogens, and should not be used as the sole basis for treatment or other patient management decisions. Negative results must be combined with clinical observations, patient history, and epidemiological information. The expected result is Negative.  Fact Sheet for Patients: SugarRoll.be  Fact Sheet for Healthcare Providers: https://www.woods-mathews.com/  This test is not yet approved or cleared by the [place] FDA and  has been authorized for detection and/or diagnosis of SARS-CoV-2 by FDA under an Emergency Use Authorization (EUA). This EUA will remain  in effect (meaning this test can be used) for the duration of the COVID-19 declaration under Se ction 564(b)(1) of the Act, 21 U.S.C. section 360bbb-3(b)(1), unless the authorization is terminated or revoked sooner.  Performed at [facility], [place] [address], [place], [place] [address]      RN Pressure Injury Documentation:     Estimated body mass index is 18.57 kg/m as calculated from the following:   Height as of this encounter: 5\' 8"  (1.727 m).   Weight as of this encounter: 55.4 kg.  Malnutrition Type:  Nutrition Problem: Moderate Malnutrition Etiology: chronic illness (advanced dementia)  Malnutrition  Characteristics:  Signs/Symptoms: mild fat depletion,mild muscle depletion,moderate fat depletion,moderate muscle depletion  Nutrition Interventions:  Interventions: Ensure Enlive (each supplement provides 350kcal and 20 grams of protein)    Radiology Studies: No results found.  Scheduled Meds: . aspirin EC  81 mg Oral BID  . atorvastatin  5 mg Oral Daily  . buPROPion  150 mg Oral Daily  . chlorhexidine  15 mL Mouth Rinse BID  . Chlorhexidine Gluconate Cloth  6 each Topical Daily  . docusate sodium  100 mg Oral BID  . feeding supplement  237 mL Oral BID BM  . ferrous sulfate  325 mg Oral TID PC  . glycopyrrolate  0.4 mg Intravenous TID  . hydrALAZINE  50 mg Oral Q8H  . levothyroxine  100 mcg Oral Q0600  . mouth rinse  15 mL Mouth Rinse BID  . memantine  14 mg Oral Daily  . metoprolol tartrate  100 mg Oral BID  . mirtazapine  7.5 mg Oral QHS  . potassium chloride  40 mEq Oral Daily   Continuous Infusions:   LOS: 7 days   Kerney Elbe, DO [facility] PAGER is on [place]  If 7PM-7AM, please contact night-coverage [email or web address]

## 2020-05-10 NOTE — TOC Progression Note (Signed)
Transition of Care [facility]) - Progression Note    Patient Details  Name: Lauren [name] MRN: [id number] Date of Birth: 11/18/1924  Transition of Care [facility] CM/SW Contact  Duell Holdren, Juliann Pulse, RN Phone Number: 05/10/2020, 9:10 AM  Clinical Narrative:Appeal process in progress-await outcome. Will contact [employer] about bed offer for tomorrow.       Expected Discharge Plan: [place] Barriers to Discharge: Unsafe home situation (Appeal d/c)  Expected Discharge Plan and Services Expected Discharge Plan: [place]       Living arrangements for the past 2 months: [place] (Memory care) Expected Discharge Date: 05/09/20                                     Social Determinants of Health (SDOH) Interventions    Readmission Risk Interventions No flowsheet data found.

## 2020-05-11 LAB — SARS CORONAVIRUS 2 (TAT 6-24 HRS): SARS Coronavirus 2: NEGATIVE

## 2020-05-11 MED ORDER — METOPROLOL TARTRATE 100 MG PO TABS: 100.0000 mg | ORAL_TABLET | Freq: Two times a day (BID) | ORAL | 0 refills | Status: AC

## 2020-05-11 NOTE — Progress Notes (Signed)
Patient bladder scanned, scan showed 549 mL of urine. Patient unable to void on her own. In and Out Cath was performed and patient had output of 400 mL of urine. Urine was amber colored. This RN performed bladder scan after In and out cath was performed and the scan showed 162 mL residual of urine.    Wynona Neat, RN

## 2020-05-11 NOTE — Discharge Summary (Addendum)
Physician Discharge Summary  Lauren [name] VZD:[id number] DOB: Aug 02, 1924 DOA: 05/02/2020  PCP: Patient, No Pcp Per  Admit date: 05/02/2020 Discharge date: 05/11/2020  Admitted From: Home Disposition: SNF  Recommendations for Outpatient Follow-up:  1. Follow up with PCP in 1-2 weeks 2. Follow up with Orthopedic Surgery as an outpatient in 1-2 weeks 3. Palliative Care to Follow at SNF; If no improvement and further decline then would focus on comfort 4. Follow up with Urology as an outpatient for Trial of Void given Acute Urinary Retention  5. Please obtain CMP/CBC, Mag, Phos in one week 6. Please follow up on the following pending results:  Home Health: No  Equipment/Devices: None    Discharge Condition: Stable  CODE STATUS: DO NOT RESUSCITATE  Diet recommendation: Soft Regular Diet  Brief/Interim Summary: The patient is a 87 year old Caucasian female with a past medical history significant for but not limited to death, dementia, hyperlipidemia, hypothyroidism, hypertension, chronic kidney disease stage IIIa as well as other comorbidities who presented to [facility] long hospital ED from [facility] skilled nursing facility after EMS was called because the patient fell out of bed.  Because of her advanced dementia she was unable to provide a subjective history and in the ED right hip x-ray revealed an acute nondisplaced right hip fracture.  Hospitalization has also been complicated by acute respiratory failure with hypoxia in the setting of pneumonia which she was adequately treated for.  Antibiotics have now been been stopped.  Case was discussed with Dr. Alvan Dame of orthopedic surgery and she was taken for a right hip hemiarthroplasty on 05/04/2020.  She is postoperative day 5 now and subsequently is doing well and was deemed stable to be discharged.  Please see full discharge summary details by Dr. Lurlean Leyden on 05/09/20.  The plan was for the patient to go to SNF with palliative to follow.   She is going to be discharged yesterday to a [facility] and report was to be called however daughter decided not to accept according to SNF bed and appealed her discharge process wanting a different SNF facility.  Currently awaiting appeal process outcome and SNF bed [facility] in [place], [place] and bed will be available tomorrow..   Discharge Diagnoses:  Principal Problem:   Fracture of femoral neck, right, closed ([place]) Active Problems:   Essential hypertension   Dementia without behavioral disturbance (HCC)   Hypothyroidism   Chronic kidney disease, stage 3a (HCC)   Pneumonia of both lungs due to infectious organism   Mixed hyperlipidemia   Malnutrition of moderate degree  Fracture of femoral neck, right, closed ([place]) -Patient suffered a fracture of the right femoral neck after a fall on evening of 05/02/2020 -Stable from orthopedic standpoint -Postop day #6 -Status post Right hip hemiarthroplasty 06/01/2020 -Dr. Roger Shelter iscussed with patient daughter that this is a palliative procedure, not anticipating much improvement post procedure with exception of better pain management-comfort -Daughter expressed understanding and agreed with current plan but decided not to accept initial SNF bed and appealed Discharge Process as she wants a Different SNF now; Currently awaiting appeal process outcome and new SNF bed availability  Acute Respiratory Failure with Hypoxia in the setting of Multifocal PNA -Continue to improve and weaned off of O2 yesterday but was on it again this AM  -SpO2: 100 % O2 Flow Rate (L/min): 2 L/min -In the setting of possible pneumonia, postop, IV fluids -We will continue DuoNeb treatment, supplemental oxygen -We will keep the patient as comfortable as  possible,  -Treat reversible causes such as volume overload, pain, pneumonia -Off of supplemental O2 now  Encephalopathic in setting of advanced dementia -Superimposed by dementia, multifactorial due to  pneumonia, hypoxia, medication postop -Mental status continues to wax and wane and has confusion -At times she is alert follows command but continues to be confused,less agitation -Continues to remove IV and Telemetry   Pneumonia of both lungs due to infectious organism -Multifocal pneumonia noted on chest x-ray during his presentation similar to chest imaging performed in September 2021 --remains clinically stable but a mild leukocytosis -ContinuedIV antibiotics Rocephin and doxycycline but now stopped  -Hemodynamically stable afebrile normotensive -SARS-CoV-2 negative,  -Procalcitonin <0.10 -CRP 2.3 -Echocardiogram >>prelim report reviewed  Essential Hypertension -We will continue as needed hydralazine, resume home medication of metoprolol and was increased to 100 mg po BID -If no response will add as needed labetalol -Continue to Monitor BP per protocol -Last BP was 164/69  Dementia without Behavioral Disturbance (HCC) -Patient exhibiting significant agitation and confusion in the setting of advanced dementia -Attempting to manage patient's pain adequately -Attempting to avoid additional painful stimuli as much as possible -Fall precautions -Frequent redirection  Hypothyroidism -Continue Home regimen of Levothyroxine  Chronic kidney disease, stage 3a (HCC) -Creatinine near baseline,BUN/Cr is now 25/0.81 yesterday  -Strict input and output monitoring -Avoiding nephrotoxic agents, contrast dyes, and Hypotension if at all possible -Will not be Monitoring renal function and electrolytes anymore as she is stable to D/C  Mixed hyperlipidemia -Continue home regimen of statin therapy  Normocytic Anemia -Patient's Hgb/Hct is stable at 11.2/35.7 yesterday  -Check Anemia Panel in the outpatient setting -Continue to Monitor for S/Sx of Bleeding; Currently no overt bleeding noted -Repeat CBC at SNF  Moderate Malnutrition in the context of Chronic  illness -Nutritionist  Consulted and recommending Ensure Enlive po BID  Acute Urinary Retention -Bladder Scan this AM showed 549 mL  -I and O Cath was performed and removed 400 mL -Repeat Bladder Scan showed 162 mL of Residual  -Repeat Bladder Scan prior to D/C revealed 479 mL and since she continues to be retaining will placeFoley and outpatient Follow up with Urologyand TOV at facility  Goal of care: -Dr. Richarda Blade discussed with daughter at bedside, agreeable to treat reversible cause, may proceed with palliative care -She confirmed DNR/DNI status -If no improvement in this hospitalization family is agreeable to comfort care measures, possible hospice but plan is for SNF with palliative to Follow   Discharge Instructions  Discharge Instructions    Activity as tolerated - No restrictions   Complete by: As directed    Activity as tolerated - No restrictions   Complete by: As directed    Diet - low sodium heart healthy   Complete by: As directed    Discharge instructions   Complete by: As directed    Under palliative care Watchful waiting. If no improvement further decline would like to then focus on comfort.   Discharge wound care:   Complete by: As directed    Per instructions   Increase activity slowly   Complete by: As directed    Increase activity slowly   Complete by: As directed    No wound care   Complete by: As directed    Per surgery Ortho recommendations, instructions   No wound care   Complete by: As directed    Weight bearing as tolerated   Complete by: As directed    Posterior hip precautions.   Laterality: right   Extremity:  Lower     Allergies as of 05/11/2020      Reactions   Aricept [donepezil Hcl] Other (See Comments)   Unknown reaction   Aspirin Other (See Comments)   Upset stomach       Medication List    STOP taking these medications   atorvastatin 10 MG tablet Commonly known as: LIPITOR   bethanechol 10 MG tablet Commonly known as:  URECHOLINE   guaiFENesin 600 MG 12 hr tablet Commonly known as: MUCINEX     TAKE these medications   acetaminophen 500 MG tablet Commonly known as: TYLENOL Take 2 tablets (1,000 mg total) by mouth every 8 (eight) hours. What changed:   how much to take  when to take this  reasons to take this   aspirin 81 MG EC tablet Take 1 tablet (81 mg total) by mouth 2 (two) times daily. Swallow whole.   buPROPion 150 MG 24 hr tablet Commonly known as: WELLBUTRIN XL TAKE 1 TABLET DAILY IN THE MORNING FOR ANXIETY What changed: See the new instructions.   docusate sodium 100 MG capsule Commonly known as: COLACE Take 1 capsule (100 mg total) by mouth 2 (two) times daily.   doxycycline 100 MG tablet Commonly known as: VIBRA-TABS Take 1 tablet (100 mg total) by mouth every 12 (twelve) hours for 5 days.   ENSURE PO Take 1 Can by mouth in the morning and at bedtime.   ferrous sulfate 325 (65 FE) MG tablet Take 1 tablet (325 mg total) by mouth 3 (three) times daily after meals.   levothyroxine 100 MCG tablet Commonly known as: SYNTHROID Take 100 mcg by mouth daily before breakfast.   memantine 14 MG Cp24 24 hr capsule Commonly known as: NAMENDA XR TAKE 1 CAPSULE DAILY FOR MEMORY What changed: See the new instructions.   metoprolol tartrate 100 MG tablet Commonly known as: LOPRESSOR Take 1 tablet (100 mg total) by mouth 2 (two) times daily. What changed:   medication strength  how much to take   mirtazapine 7.5 MG tablet Commonly known as: REMERON Take 7.5 mg by mouth at bedtime. What changed: Another medication with the same name was removed. Continue taking this medication, and follow the directions you see here.   OXYGEN Inhale 2 L into the lungs daily as needed (patient comfort).   traMADol 50 MG tablet Commonly known as: Ultram Take 1-2 tablets (50-100 mg total) by mouth every 6 (six) hours as needed.            Discharge Care Instructions  (From admission,  onward)         Start     Ordered   05/09/20 0000  Discharge wound care:       Comments: Per instructions   05/09/20 1123   05/05/20 0000  Weight bearing as tolerated       Comments: Posterior hip precautions.  Question Answer Comment  Laterality right   Extremity Lower      05/05/20 0946          Contact information for follow-up providers    Paralee Cancel, MD. Schedule an appointment as soon as possible for a visit in 2 weeks.   Specialty: Orthopedic Surgery Contact information: [address] [place] [address] [phone]            Contact information for after-discharge care    [facility] .   Service: Skilled  Nursing Contact information: [place] [phone]                 Allergies  Allergen Reactions  . Aricept [Donepezil Hcl] Other (See Comments)    Unknown reaction  . Aspirin Other (See Comments)    Upset stomach     Consultations:  Orthopedic Surgery  Palliative Care Medicine  Procedures/Studies: DG Chest 1 View  Result Date: 05/02/2020 CLINICAL DATA:  Fall EXAM: CHEST  1 VIEW COMPARISON:  12/05/2019 FINDINGS: Patchy focus of airspace disease in the right mid lung. Suspected consolidation left base with possible small effusion. Stable cardiomediastinal silhouette. No pneumothorax IMPRESSION: Patchy focus of airspace disease in the right mid lung with suspected patchy consolidation left base, possible pneumonia. Probable small left effusion. Electronically Signed   By: Donavan Foil M.D.   On: 05/02/2020 22:13   DG Pelvis Portable  Result Date: 05/04/2020 CLINICAL DATA:  Status post hemiarthroplasty. EXAM: PORTABLE PELVIS 1-2 VIEWS COMPARISON:  Radiograph 05/02/2020 FINDINGS: Right hemiarthroplasty in expected alignment. There is no periprosthetic lucency or fracture. Recent  postsurgical change includes air and edema in the soft tissues. IMPRESSION: Right hemiarthroplasty without immediate postoperative complication. Electronically Signed   By: Keith Rake M.D.   On: 05/04/2020 22:41   ECHOCARDIOGRAM COMPLETE  Result Date: 05/10/2020    ECHOCARDIOGRAM REPORT   Patient Name:   SHATASIA CUTSHAW Date of Exam: 05/03/2020 Medical Rec #:  [id number]       Height:       68.0 in Accession #:    [id number]      Weight:       122.1 lb Date of Birth:  1924-11-03        BSA:          1.657 m Patient Age:    72 years        BP:           156/75 mmHg Patient Gender: F               HR:           67 bpm. Exam Location:  Inpatient Procedure: 2D Echo Indications:     Congestive Heart Failure; Pre-OP evaluation  History:         Patient has no prior history of Echocardiogram examinations.                  Risk Factors:Hypertension.  Sonographer:     Mikki Santee RDCS (AE) Referring Phys:  [id number] [place] Diagnosing Phys: Oswaldo Milian MD IMPRESSIONS  1. Left ventricular ejection fraction, by estimation, is 55 to 60%. The left ventricle has normal function. The left ventricle has no regional wall motion abnormalities. There is severe asymmetric left ventricular hypertrophy of the basal-septal segment. Left ventricular diastolic parameters are consistent with Grade II diastolic dysfunction (pseudonormalization). Elevated left atrial pressure.  2. Right ventricular systolic function is mildly reduced. The right ventricular size is mildly enlarged. There is moderately elevated pulmonary artery systolic pressure. The estimated right ventricular systolic pressure is 24.8 mmHg.  3. Right atrial size was mildly dilated.  4. The mitral valve is degenerative. Trivial mitral valve regurgitation. No evidence of mitral stenosis. Moderate mitral annular calcification.  5. Tricuspid valve regurgitation is moderate.  6. The aortic valve is tricuspid. Aortic valve regurgitation is mild. Mild  aortic valve sclerosis is present, with no evidence of aortic valve stenosis.  7. Aortic dilatation noted. There is  mild dilatation of the ascending aorta, measuring 37 mm. FINDINGS  Left Ventricle: Left ventricular ejection fraction, by estimation, is 55 to 60%. The left ventricle has normal function. The left ventricle has no regional wall motion abnormalities. The left ventricular internal cavity size was normal in size. There is  severe asymmetric left ventricular hypertrophy of the basal-septal segment. Left ventricular diastolic parameters are consistent with Grade II diastolic dysfunction (pseudonormalization). Elevated left atrial pressure. Right Ventricle: The right ventricular size is mildly enlarged. Right vetricular wall thickness was not well visualized. Right ventricular systolic function is mildly reduced. There is moderately elevated pulmonary artery systolic pressure. The tricuspid  regurgitant velocity is 3.57 m/s, and with an assumed right atrial pressure of 8 mmHg, the estimated right ventricular systolic pressure is 25.0 mmHg. Left Atrium: Left atrial size was normal in size. Right Atrium: Right atrial size was mildly dilated. Pericardium: There is no evidence of pericardial effusion. Mitral Valve: The mitral valve is degenerative in appearance. Moderate mitral annular calcification. Trivial mitral valve regurgitation. No evidence of mitral valve stenosis. Tricuspid Valve: The tricuspid valve is normal in structure. Tricuspid valve regurgitation is moderate. Aortic Valve: The aortic valve is tricuspid. Aortic valve regurgitation is mild. Aortic regurgitation PHT measures 349 msec. Mild aortic valve sclerosis is present, with no evidence of aortic valve stenosis. Pulmonic Valve: The pulmonic valve was grossly normal. Pulmonic valve regurgitation is trivial. Aorta: The aortic root is normal in size and structure and aortic dilatation noted. There is mild dilatation of the ascending aorta,  measuring 37 mm. IAS/Shunts: The interatrial septum was not well visualized.  LEFT VENTRICLE PLAX 2D LVIDd:         3.90 cm  Diastology LVIDs:         3.00 cm  LV e' medial:    4.58 cm/s LV PW:         1.10 cm  LV E/e' medial:  18.3 LV IVS:        1.20 cm  LV e' lateral:   9.38 cm/s LVOT diam:     2.10 cm  LV E/e' lateral: 9.0 LV SV:         56 LV SV Index:   34 LVOT Area:     3.46 cm  RIGHT VENTRICLE RV S prime:     8.10 cm/s TAPSE (M-mode): 1.5 cm LEFT ATRIUM           Index       RIGHT ATRIUM           Index LA diam:      3.80 cm 2.29 cm/m  RA Area:     18.60 cm LA Vol (A2C): 33.6 ml 20.27 ml/m RA Volume:   48.05 ml  28.99 ml/m LA Vol (A4C): 34.4 ml 20.76 ml/m  AORTIC VALVE LVOT Vmax:         64.00 cm/s LVOT Vmean:        43.500 cm/s LVOT VTI:          0.162 m AI PHT:            349 msec AR Vena Contracta: 0.20 cm  AORTA Ao Root diam: 3.60 cm MITRAL VALVE               TRICUSPID VALVE MV Area (PHT): 5.38 cm    TR Peak grad:   51.0 mmHg MV Decel Time: 141 msec    TR Vmax:        357.00 cm/s MV E velocity:  84.00 cm/s MV A velocity: 55.30 cm/s  SHUNTS MV E/A ratio:  1.52        Systemic VTI:  0.16 m                            Systemic Diam: 2.10 cm Oswaldo Milian MD Electronically signed by Oswaldo Milian MD Signature Date/Time: 05/03/2020/3:09:44 PM    Final (Updated)    DG Hip Unilat W or Wo Pelvis 2-3 Views Right  Result Date: 05/02/2020 CLINICAL DATA:  Fall EXAM: DG HIP (WITH OR WITHOUT PELVIS) 2-3V RIGHT COMPARISON:  None. FINDINGS: SI joints are non widened. Pubic symphysis appears intact. Acute nondisplaced right femoral neck fracture. No femoral head dislocation. IMPRESSION: Acute nondisplaced right femoral neck fracture. Electronically Signed   By: Donavan Foil M.D.   On: 05/02/2020 22:12    Subjective: Seen and examined at bedside and she is doing fairly well and had no complaints.  Slept well.  Still pleasantly confused and demented.  No other concerns reported at this time but  has an acute urinary tension overnight so had an catheter.  Bladder scan prior to discharge.  She denies any other concerns or complaints this time and stable to be discharged to skilled nursing facility.  Discharge Exam: Vitals:   05/10/20 2146 05/11/20 0425  BP: (!) 178/79 (!) 164/69  Pulse: 78 63  Resp: 19 18  Temp: 97.6 F (36.4 C) 98 F (36.7 C)  SpO2: 95% 100%   Vitals:   05/10/20 0455 05/10/20 1345 05/10/20 2146 05/11/20 0425  BP: (!) 158/75 (!) 145/66 (!) 178/79 (!) 164/69  Pulse: 67 66 78 63  Resp: 17 16 19 18   Temp: 98.3 F (36.8 C) 99 F (37.2 C) 97.6 F (36.4 C) 98 F (36.7 C)  TempSrc: Oral Axillary Axillary Oral  SpO2: 99% 97% 95% 100%  Weight:      Height:        General: Pt is alert, awake, not in acute distress Cardiovascular: RRR, S1/S2 +, no rubs, no gallops Respiratory: Diminished bilaterally, no wheezing, no rhonchi; wearing supplemental oxygen via nasal cannula this morning Abdominal: Soft, NT, ND, bowel sounds + Extremities: No appreciable edema, no cyanosis  The results of significant diagnostics from this hospitalization (including imaging, microbiology, ancillary and laboratory) are listed below for reference.    Microbiology: Recent Results (from the past 240 hour(s))  SARS CORONAVIRUS 2 (TAT 6-24 HRS) Nasopharyngeal Nasopharyngeal Swab     Status: None   Collection Time: 05/02/20 10:33 PM   Specimen: Nasopharyngeal Swab  Result Value Ref Range Status   SARS Coronavirus 2 NEGATIVE NEGATIVE Final    Comment: (NOTE) SARS-CoV-2 target nucleic acids are NOT DETECTED.  The SARS-CoV-2 RNA is generally detectable in upper and lower respiratory specimens during the acute phase of infection. Negative results do not preclude SARS-CoV-2 infection, do not rule out co-infections with other pathogens, and should not be used as the sole basis for treatment or other patient management decisions. Negative results must be combined with clinical  observations, patient history, and epidemiological information. The expected result is Negative.  Fact Sheet for Patients: SugarRoll.be  Fact Sheet for Healthcare Providers: https://www.woods-mathews.com/  This test is not yet approved or cleared by the [place] FDA and  has been authorized for detection and/or diagnosis of SARS-CoV-2 by FDA under an Emergency Use Authorization (EUA). This EUA will remain  in effect (meaning this test can be used) for the  duration of the COVID-19 declaration under Se ction 564(b)(1) of the Act, 21 U.S.C. section 360bbb-3(b)(1), unless the authorization is terminated or revoked sooner.  Performed at [facility], [facility] [address] [place], [place] [address]   Resp Panel by RT-PCR (Flu A&B, Covid) Nasopharyngeal Swab     Status: None   Collection Time: 05/03/20 12:44 AM   Specimen: Nasopharyngeal Swab; Nasopharyngeal(NP) swabs in vial transport medium  Result Value Ref Range Status   SARS Coronavirus 2 by RT PCR NEGATIVE NEGATIVE Final    Comment: (NOTE) SARS-CoV-2 target nucleic acids are NOT DETECTED.  The SARS-CoV-2 RNA is generally detectable in upper respiratory specimens during the acute phase of infection. The lowest concentration of SARS-CoV-2 viral copies this assay can detect is 138 copies/mL. A negative result does not preclude SARS-Cov-2 infection and should not be used as the sole basis for treatment or other patient management decisions. A negative result may occur with  improper specimen collection/handling, submission of specimen other than nasopharyngeal swab, presence of viral mutation(s) within the areas targeted by this assay, and inadequate number of viral copies(<138 copies/mL). A negative result must be combined with clinical observations, patient history, and epidemiological information. The expected result is Negative.  Fact Sheet for Patients:   EntrepreneurPulse.com.au  Fact Sheet for Healthcare Providers:  IncredibleEmployment.be  This test is no t yet approved or cleared by the [place] FDA and  has been authorized for detection and/or diagnosis of SARS-CoV-2 by FDA under an Emergency Use Authorization (EUA). This EUA will remain  in effect (meaning this test can be used) for the duration of the COVID-19 declaration under Section 564(b)(1) of the Act, 21 U.S.C.section 360bbb-3(b)(1), unless the authorization is terminated  or revoked sooner.       Influenza A by PCR NEGATIVE NEGATIVE Final   Influenza B by PCR NEGATIVE NEGATIVE Final    Comment: (NOTE) The Xpert Xpress SARS-CoV-2/FLU/RSV plus assay is intended as an aid in the diagnosis of influenza from Nasopharyngeal swab specimens and should not be used as a sole basis for treatment. Nasal washings and aspirates are unacceptable for Xpert Xpress SARS-CoV-2/FLU/RSV testing.  Fact Sheet for Patients: EntrepreneurPulse.com.au  Fact Sheet for Healthcare Providers: IncredibleEmployment.be  This test is not yet approved or cleared by the [place] FDA and has been authorized for detection and/or diagnosis of SARS-CoV-2 by FDA under an Emergency Use Authorization (EUA). This EUA will remain in effect (meaning this test can be used) for the duration of the COVID-19 declaration under Section 564(b)(1) of the Act, 21 U.S.C. section 360bbb-3(b)(1), unless the authorization is terminated or revoked.  Performed at [facility], [place] [address] [place], [place] [address]   Culture, blood (routine x 2)     Status: None   Collection Time: 05/03/20  5:28 AM   Specimen: BLOOD LEFT HAND  Result Value Ref Range Status   Specimen Description   Final    BLOOD LEFT HAND Performed at [facility] [address], [place], [place] [address]    Special Requests    Final    BOTTLES DRAWN AEROBIC ONLY Blood Culture adequate volume Performed at [facility] [address], [place], [place] [address]    Culture   Final    NO GROWTH 5 DAYS Performed at [facility], [place] [address] [place], [place] [address]    Report Status 05/08/2020 FINAL  Final  Culture, blood (routine x 2)  Status: None   Collection Time: 05/03/20  5:28 AM   Specimen: BLOOD RIGHT HAND  Result Value Ref Range Status   Specimen Description   Final    BLOOD RIGHT HAND Performed at [facility] [address], [place], [place] [address]    Special Requests   Final    BOTTLES DRAWN AEROBIC ONLY Blood Culture adequate volume Performed at [facility] [address], [place], [place] [address]    Culture   Final    NO GROWTH 5 DAYS Performed at [facility], [place] [address] [place], [place] [address]    Report Status 05/08/2020 FINAL  Final  SARS CORONAVIRUS 2 (TAT 6-24 HRS) Nasopharyngeal Nasopharyngeal Swab     Status: None   Collection Time: 05/08/20 12:58 PM   Specimen: Nasopharyngeal Swab  Result Value Ref Range Status   SARS Coronavirus 2 NEGATIVE NEGATIVE Final    Comment: (NOTE) SARS-CoV-2 target nucleic acids are NOT DETECTED.  The SARS-CoV-2 RNA is generally detectable in upper and lower respiratory specimens during the acute phase of infection. Negative results do not preclude SARS-CoV-2 infection, do not rule out co-infections with other pathogens, and should not be used as the sole basis for treatment or other patient management decisions. Negative results must be combined with clinical observations, patient history, and epidemiological information. The expected result is Negative.  Fact Sheet for Patients: SugarRoll.be  Fact Sheet for Healthcare Providers: https://www.woods-mathews.com/  This test is not yet approved or cleared by the  [place] FDA and  has been authorized for detection and/or diagnosis of SARS-CoV-2 by FDA under an Emergency Use Authorization (EUA). This EUA will remain  in effect (meaning this test can be used) for the duration of the COVID-19 declaration under Se ction 564(b)(1) of the Act, 21 U.S.C. section 360bbb-3(b)(1), unless the authorization is terminated or revoked sooner.  Performed at [facility], [place] [address], [place], [place] [address]   SARS CORONAVIRUS 2 (TAT 6-24 HRS) Nasopharyngeal Nasopharyngeal Swab     Status: None   Collection Time: 05/10/20  7:44 PM   Specimen: Nasopharyngeal Swab  Result Value Ref Range Status   SARS Coronavirus 2 NEGATIVE NEGATIVE Final    Comment: (NOTE) SARS-CoV-2 target nucleic acids are NOT DETECTED.  The SARS-CoV-2 RNA is generally detectable in upper and lower respiratory specimens during the acute phase of infection. Negative results do not preclude SARS-CoV-2 infection, do not rule out co-infections with other pathogens, and should not be used as the sole basis for treatment or other patient management decisions. Negative results must be combined with clinical observations, patient history, and epidemiological information. The expected result is Negative.  Fact Sheet for Patients: SugarRoll.be  Fact Sheet for Healthcare Providers: https://www.woods-mathews.com/  This test is not yet approved or cleared by the [place] FDA and  has been authorized for detection and/or diagnosis of SARS-CoV-2 by FDA under an Emergency Use Authorization (EUA). This EUA will remain  in effect (meaning this test can be used) for the duration of the COVID-19 declaration under Se ction 564(b)(1) of the Act, 21 U.S.C. section 360bbb-3(b)(1), unless the authorization is terminated or revoked sooner.  Performed at [facility], [place] [address], [place], [place] [address]     Labs: BNP (last 3  results) Recent Labs    05/03/20 0528  BNP 315.1*   Basic Metabolic Panel: Recent Labs  Lab 05/05/20 0433 05/06/20 7616 05/07/20 0533 05/08/20 0509 05/09/20 0546  05/10/20 0832  NA 142 140 142 143 143 141  K 3.7 3.2* 3.0* 3.1* 3.5 3.6  CL 105 104 105 106 106 103  CO2 25 25 26 26 26 27   GLUCOSE 147* 106* 101* 107* 104* 107*  BUN 25* 29* 31* 34* 28* 25*  CREATININE 0.98 1.19* 1.02* 0.92 0.84 0.81  CALCIUM 7.1* 6.9* 6.9* 7.3* 7.7* 8.0*  MG 1.8  --   --  2.6*  --  2.2  PHOS  --   --   --   --   --  3.4   Liver Function Tests: Recent Labs  Lab 05/10/20 0832  AST 23  ALT 17  ALKPHOS 89  BILITOT 0.8  PROT 5.9*  ALBUMIN 2.8*   No results for input(s): LIPASE, AMYLASE in the last 168 hours. No results for input(s): AMMONIA in the last 168 hours. CBC: Recent Labs  Lab 05/06/20 0552 05/07/20 0533 05/08/20 0509 05/09/20 0546 05/10/20 0832  WBC 10.9* 9.6 10.9* 9.4 9.5  NEUTROABS  --   --   --   --  6.6  HGB 10.3* 10.3* 10.4* 10.3* 11.2*  HCT 32.1* 32.0* 32.9* 32.7* 35.7*  MCV 97.3 96.7 97.6 97.0 98.9  PLT 114* 139* 143* 174 229   Cardiac Enzymes: No results for input(s): CKTOTAL, CKMB, CKMBINDEX, TROPONINI in the last 168 hours. BNP: Invalid input(s): POCBNP CBG: Recent Labs  Lab 05/05/20 1228  GLUCAP 101*   D-Dimer No results for input(s): DDIMER in the last 72 hours. Hgb A1c No results for input(s): HGBA1C in the last 72 hours. Lipid Profile No results for input(s): CHOL, HDL, LDLCALC, TRIG, CHOLHDL, LDLDIRECT in the last 72 hours. Thyroid function studies No results for input(s): TSH, T4TOTAL, T3FREE, THYROIDAB in the last 72 hours.  Invalid input(s): FREET3 Anemia work up No results for input(s): VITAMINB12, FOLATE, FERRITIN, TIBC, IRON, RETICCTPCT in the last 72 hours. Urinalysis    Component Value Date/Time   COLORURINE YELLOW 05/02/2020 2233   APPEARANCEUR CLEAR 05/02/2020 2233   LABSPEC 1.014 05/02/2020 2233   PHURINE 6.0 05/02/2020 2233    GLUCOSEU NEGATIVE 05/02/2020 2233   HGBUR NEGATIVE 05/02/2020 2233   [place] 05/02/2020 2233   KETONESUR NEGATIVE 05/02/2020 2233   PROTEINUR 30 (A) 05/02/2020 2233   UROBILINOGEN 0.2 02/12/2014 1015   NITRITE NEGATIVE 05/02/2020 2233   LEUKOCYTESUR NEGATIVE 05/02/2020 2233   Sepsis Labs Invalid input(s): PROCALCITONIN,  WBC,  LACTICIDVEN Microbiology Recent Results (from the past 240 hour(s))  SARS CORONAVIRUS 2 (TAT 6-24 HRS) Nasopharyngeal Nasopharyngeal Swab     Status: None   Collection Time: 05/02/20 10:33 PM   Specimen: Nasopharyngeal Swab  Result Value Ref Range Status   SARS Coronavirus 2 NEGATIVE NEGATIVE Final    Comment: (NOTE) SARS-CoV-2 target nucleic acids are NOT DETECTED.  The SARS-CoV-2 RNA is generally detectable in upper and lower respiratory specimens during the acute phase of infection. Negative results do not preclude SARS-CoV-2 infection, do not rule out co-infections with other pathogens, and should not be used as the sole basis for treatment or other patient management decisions. Negative results must be combined with clinical observations, patient history, and epidemiological information. The expected result is Negative.  Fact Sheet for Patients: SugarRoll.be  Fact Sheet for Healthcare Providers: https://www.woods-mathews.com/  This test is not yet approved or cleared by the [place] FDA and  has been authorized for detection and/or diagnosis of SARS-CoV-2 by FDA under an Emergency Use Authorization (EUA). This EUA will remain  in  effect (meaning this test can be used) for the duration of the COVID-19 declaration under Se ction 564(b)(1) of the Act, 21 U.S.C. section 360bbb-3(b)(1), unless the authorization is terminated or revoked sooner.  Performed at [facility], [place] [address], [place], [place] [address]   Resp Panel by RT-PCR (Flu A&B, Covid) Nasopharyngeal Swab     Status:  None   Collection Time: 05/03/20 12:44 AM   Specimen: Nasopharyngeal Swab; Nasopharyngeal(NP) swabs in vial transport medium  Result Value Ref Range Status   SARS Coronavirus 2 by RT PCR NEGATIVE NEGATIVE Final    Comment: (NOTE) SARS-CoV-2 target nucleic acids are NOT DETECTED.  The SARS-CoV-2 RNA is generally detectable in upper respiratory specimens during the acute phase of infection. The lowest concentration of SARS-CoV-2 viral copies this assay can detect is 138 copies/mL. A negative result does not preclude SARS-Cov-2 infection and should not be used as the sole basis for treatment or other patient management decisions. A negative result may occur with  improper specimen collection/handling, submission of specimen other than nasopharyngeal swab, presence of viral mutation(s) within the areas targeted by this assay, and inadequate number of viral copies(<138 copies/mL). A negative result must be combined with clinical observations, patient history, and epidemiological information. The expected result is Negative.  Fact Sheet for Patients:  EntrepreneurPulse.com.au  Fact Sheet for Healthcare Providers:  IncredibleEmployment.be  This test is no t yet approved or cleared by the [place] FDA and  has been authorized for detection and/or diagnosis of SARS-CoV-2 by FDA under an Emergency Use Authorization (EUA). This EUA will remain  in effect (meaning this test can be used) for the duration of the COVID-19 declaration under Section 564(b)(1) of the Act, 21 U.S.C.section 360bbb-3(b)(1), unless the authorization is terminated  or revoked sooner.       Influenza A by PCR NEGATIVE NEGATIVE Final   Influenza B by PCR NEGATIVE NEGATIVE Final    Comment: (NOTE) The Xpert Xpress SARS-CoV-2/FLU/RSV plus assay is intended as an aid in the diagnosis of influenza from Nasopharyngeal swab specimens and should not be used as a sole basis for  treatment. Nasal washings and aspirates are unacceptable for Xpert Xpress SARS-CoV-2/FLU/RSV testing.  Fact Sheet for Patients: EntrepreneurPulse.com.au  Fact Sheet for Healthcare Providers: IncredibleEmployment.be  This test is not yet approved or cleared by the [place] FDA and has been authorized for detection and/or diagnosis of SARS-CoV-2 by FDA under an Emergency Use Authorization (EUA). This EUA will remain in effect (meaning this test can be used) for the duration of the COVID-19 declaration under Section 564(b)(1) of the Act, 21 U.S.C. section 360bbb-3(b)(1), unless the authorization is terminated or revoked.  Performed at [facility], [place] [address] [place], [place] [address]   Culture, blood (routine x 2)     Status: None   Collection Time: 05/03/20  5:28 AM   Specimen: BLOOD LEFT HAND  Result Value Ref Range Status   Specimen Description   Final    BLOOD LEFT HAND Performed at [place] [address] [place], [place] [address]    Special Requests   Final    BOTTLES DRAWN AEROBIC ONLY Blood Culture adequate volume Performed at [place] [address] [place], [place] [address]    Culture   Final    NO GROWTH 5 DAYS Performed at [facility], [place] [address] [place], [place] [address]    Report Status 05/08/2020 FINAL  Final  Culture, blood (routine x 2)     Status: None   Collection Time: 05/03/20  5:28 AM   Specimen: BLOOD RIGHT HAND  Result Value Ref Range Status   Specimen Description   Final    BLOOD RIGHT HAND Performed at [facility] [address], [place], [place] [address]    Special Requests   Final    BOTTLES DRAWN AEROBIC ONLY Blood Culture adequate volume Performed at [place] [address] [place], [place] [address]    Culture   Final    NO GROWTH 5 DAYS Performed at [facility], [place] [address], [place], [place] [address]    Report Status 05/08/2020 FINAL  Final  SARS CORONAVIRUS 2 (TAT 6-24 HRS) Nasopharyngeal Nasopharyngeal Swab     Status: None   Collection Time: 05/08/20 12:58 PM   Specimen: Nasopharyngeal Swab  Result Value Ref Range Status   SARS Coronavirus 2 NEGATIVE NEGATIVE Final    Comment: (NOTE) SARS-CoV-2 target nucleic acids are NOT DETECTED.  The SARS-CoV-2 RNA is generally detectable in upper and lower respiratory specimens during the acute phase of infection. Negative results do not preclude SARS-CoV-2 infection, do not rule out co-infections with other pathogens, and should not be used as the sole basis for treatment or other patient management decisions. Negative results must be combined with clinical observations, patient history, and epidemiological information. The expected result is Negative.  Fact Sheet for Patients: SugarRoll.be  Fact Sheet for Healthcare Providers: https://www.woods-mathews.com/  This test is not yet approved or cleared by the [place] FDA and  has been authorized for detection and/or diagnosis of SARS-CoV-2 by FDA under an Emergency Use Authorization (EUA). This EUA will remain  in effect (meaning this test can be used) for the duration of the COVID-19 declaration under Se ction 564(b)(1) of the Act, 21 U.S.C. section 360bbb-3(b)(1), unless the authorization is terminated or revoked sooner.  Performed at [facility], [place] [address] [place], [place] [address]   SARS CORONAVIRUS 2 (TAT 6-24 HRS) Nasopharyngeal Nasopharyngeal Swab     Status: None   Collection Time: 05/10/20  7:44 PM   Specimen: Nasopharyngeal Swab  Result Value Ref Range Status   SARS Coronavirus 2 NEGATIVE NEGATIVE Final    Comment: (NOTE) SARS-CoV-2 target nucleic acids are NOT DETECTED.  The SARS-CoV-2 RNA is generally detectable in upper and lower respiratory specimens  during the acute phase of infection. Negative results do not preclude SARS-CoV-2 infection, do not rule out co-infections with other pathogens, and should not be used as the sole basis for treatment or other patient management decisions. Negative results must be combined with clinical observations, patient history, and epidemiological information. The expected result is Negative.  Fact Sheet for Patients: SugarRoll.be  Fact Sheet for Healthcare Providers: https://www.woods-mathews.com/  This test is not yet approved or cleared by the [place] FDA and  has been authorized for detection and/or diagnosis of SARS-CoV-2 by FDA under an Emergency Use Authorization (EUA). This EUA will remain  in effect (meaning this test can be used) for the duration of the COVID-19 declaration under Se ction 564(b)(1) of the Act, 21 U.S.C. section 360bbb-3(b)(1), unless the authorization is terminated or revoked sooner.  Performed at [facility], [place] [address] [place], [place] [address]    Time coordinating discharge: 35 minutes  SIGNED:  Kerney Elbe, DO [facility] 05/11/2020, 10:25 AM Pager is on [place]  If 7PM-7AM, please contact  night-coverage [email or web address]

## 2020-05-11 NOTE — TOC Transition Note (Signed)
Transition of Care [facility]) - CM/SW Discharge Note   Patient Details  Name: KYLLIE PETTIJOHN MRN: [id number] Date of Birth: December 02, 1924  [facility] CM/SW Contact:  Dessa Phi, RN Phone Number: 05/11/2020, 11:47 AM   Clinical Narrative: Damaris Schooner to [place] rep [facility]-accepted-bed ready-going to rm#[phone],nsg report tel#[phone].dtr Cathy aware-PTAR called-time frame 1-2p for p/u per PTAR. Forms in printer for [place] to put in packet-DNR in shadow chart. No further CM needs.     Final next level of care: Skilled Nursing Facility Barriers to Discharge: No Barriers Identified   Patient Goals and CMS Choice Patient states their goals for this hospitalization and ongoing recovery are:: go to SNF CMS Medicare.gov Compare Post Acute Care list provided to:: Patient Represenative (must comment)    Discharge Placement PASRR number recieved: 05/08/20            Patient [facility] bed at: [facility] Patient to be transferred to facility by: [place] Name of family member notified: [name] dtr [phone] Patient and family notified of of transfer: 05/11/20  Discharge Plan and Services                                     Social Determinants of Health (SDOH) Interventions     Readmission Risk Interventions No flowsheet data found.

## 2020-05-22 ENCOUNTER — Ambulatory Visit: Payer: Medicare HMO | Admitting: Urology

## 2020-05-22 ENCOUNTER — Ambulatory Visit: Payer: Self-pay | Admitting: Physician Assistant

## 2020-05-24 ENCOUNTER — Ambulatory Visit (INDEPENDENT_AMBULATORY_CARE_PROVIDER_SITE_OTHER): Payer: Medicare HMO | Admitting: Physician Assistant

## 2020-05-24 ENCOUNTER — Other Ambulatory Visit: Payer: Self-pay

## 2020-05-24 ENCOUNTER — Encounter: Payer: Self-pay | Admitting: Urology

## 2020-05-24 ENCOUNTER — Ambulatory Visit (INDEPENDENT_AMBULATORY_CARE_PROVIDER_SITE_OTHER): Payer: Medicare HMO | Admitting: Urology

## 2020-05-24 VITALS — BP 141/82 | HR 86 | Ht 66.0 in | Wt 109.0 lb

## 2020-05-24 DIAGNOSIS — R338 Other retention of urine: Secondary | ICD-10-CM

## 2020-05-24 LAB — BLADDER SCAN AMB NON-IMAGING

## 2020-05-24 NOTE — Progress Notes (Signed)
05/24/2020 10:33 AM   Michaelene Song 02/10/1925 [id number]  Referring provider: No referring provider defined for this encounter.  Chief Complaint  Patient presents with  . Urinary Retention    HPI: 87 year old female with multiple medical comorbidities who sustained a right hip fracture after falling out of bed.  Her postoperative course was complicated by pneumonia and acute respiratory failure as well as acute urinary retention.  She was ultimately discharged to [facility] with a Foley in place.  She returns today for voiding trial.  She is able to stand and pivot.  She is no longer having pain.  It looks like she was started on Macrobid yesterday for total of 5-day course for presumed UTI based on paperwork.  She is accompanied by her daughter today.  No personal history of urinary retention.  PMH: Past Medical History:  Diagnosis Date  . Anxiety   . Cancer ([place]) 12/01/2018   skin cancer on her head(L)  . High cholesterol   . Hypertension   . Osteoporosis   . Rosacea   . Thyroid disease   . Weight loss     Surgical History: Past Surgical History:  Procedure Laterality Date  . CHOLECYSTECTOMY  1959   [place]  . THYROIDECTOMY  2003   [place]  . TONSILLECTOMY AND ADENOIDECTOMY    . TOTAL HIP ARTHROPLASTY N/A 05/04/2020   Procedure: RIGHT HIP HEMIARTHROPLASTY;  Surgeon: Paralee Cancel, MD;  Location: [facility] ORS;  Service: Orthopedics;  Laterality: N/A;    Home Medications:  Allergies as of 05/24/2020      Reactions   Aricept [donepezil Hcl] Other (See Comments)   Unknown reaction   Aspirin Other (See Comments)   Upset stomach       Medication List       Accurate as of May 24, 2020 10:33 AM. If you have any questions, ask your nurse or doctor.        acetaminophen 500 MG tablet Commonly known as: TYLENOL Take 2 tablets (1,000 mg total) by mouth every 8 (eight) hours.   aspirin 81 MG EC tablet Take 1 tablet (81 mg total) by mouth 2 (two) times  daily. Swallow whole.   buPROPion 150 MG 24 hr tablet Commonly known as: WELLBUTRIN XL TAKE 1 TABLET DAILY IN THE MORNING FOR ANXIETY What changed: See the new instructions.   docusate sodium 100 MG capsule Commonly known as: COLACE Take 1 capsule (100 mg total) by mouth 2 (two) times daily.   ENSURE PO Take 1 Can by mouth in the morning and at bedtime.   ferrous sulfate 325 (65 FE) MG tablet Take 1 tablet (325 mg total) by mouth 3 (three) times daily after meals.   levothyroxine 100 MCG tablet Commonly known as: SYNTHROID Take 100 mcg by mouth daily before breakfast.   memantine 14 MG Cp24 24 hr capsule Commonly known as: NAMENDA XR TAKE 1 CAPSULE DAILY FOR MEMORY What changed: See the new instructions.   metoprolol tartrate 100 MG tablet Commonly known as: LOPRESSOR Take 1 tablet (100 mg total) by mouth 2 (two) times daily.   mirtazapine 7.5 MG tablet Commonly known as: REMERON Take 7.5 mg by mouth at bedtime.   OXYGEN Inhale 2 L into the lungs daily as needed (patient comfort).   traMADol 50 MG tablet Commonly known as: Ultram Take 1-2 tablets (50-100 mg total) by mouth every 6 (six) hours as needed.       Allergies:  Allergies  Allergen Reactions  .  Aricept [Donepezil Hcl] Other (See Comments)    Unknown reaction  . Aspirin Other (See Comments)    Upset stomach     Family History: Family History  Problem Relation Age of Onset  . Hypertension Mother   . Stroke Mother   . Stroke Father   . Lung cancer Daughter     Social History:  reports that she has never smoked. She has never used smokeless tobacco. She reports that she does not drink alcohol and does not use drugs.   Physical Exam: BP (!) 141/82   Pulse 86   Ht 5\' 6"  (1.676 m)   Wt 109 lb (49.4 kg)   BMI 17.59 kg/m   Constitutional: Alert but not oriented.  Sitting in wheelchair.  Unable to provide additional history.  Accompanied by daughter. HEENT: [place] AT, moist mucus membranes.  Trachea  midline, no masses. Cardiovascular: No clubbing, cyanosis, or edema. Respiratory: Normal respiratory effort, no increased work of breathing. GU: Foley with clear yellow urine Skin: No rashes, bruises or suspicious lesions. Neurologic: Grossly intact, no focal deficits, moving all 4 extremities. Psychiatric: Normal mood and affect.  Laboratory Data: Lab Results  Component Value Date   WBC 9.5 05/10/2020   HGB 11.2 (L) 05/10/2020   HCT 35.7 (L) 05/10/2020   MCV 98.9 05/10/2020   PLT 229 05/10/2020    Lab Results  Component Value Date   CREATININE 0.81 05/10/2020    Assessment & Plan:    1. Acute urinary retention Multifactorial acute urinary retention following hip arthroplasty an elderly female with advanced dementia  Voiding trial today now that she is weightbearing, return later this afternoon for PVR  She is currently on antibiotics thus no additional antibiotics given today, continue course of Macrobid as prescribed by [employer]  Follow-up as needed  Hollice Espy, MD  [place] [address], [place], [place] [address] [phone]

## 2020-05-24 NOTE — Patient Instructions (Signed)
Come back tomorrow for a repeat bladder scan to make sure you're emptying your bladder. If you are not emptying well at that point, we will replace your Foley catheter.  If you do not urinate overnight and are having lower abdominal pain or distention, please call our office in the morning; I can see you sooner tomorrow if needed.

## 2020-05-24 NOTE — Progress Notes (Signed)
Fill and Pull Catheter Removal  Patient is present today for a catheter removal.  Patient was cleaned and prepped in a sterile fashion 200 ml of sterile water/ saline was instilled into the bladder when the patient felt the urge to urinate. 58ml of water was then drained from the balloon.  A 16FR foley cath was removed from the bladder no complications were noted .  Patient as then given some time to void on their own.  Patient can void  30 ml on their own after some time.  Patient tolerated well.  Performed by: Kerman Passey, RMA  Follow up/ Additional notes: pt will return this afternoon for a PVR

## 2020-05-24 NOTE — Progress Notes (Signed)
Patient returned to clinic this afternoon for follow-up PVR.  She is accompanied by her daughter.  Patient has not voided today.  Bladder scan with 107 mL.  Patient is asleep in her wheelchair and unable to contribute to HPI.  I had a lengthy conversation with the patient's daughter today.  I explained that you do not see any indication for Foley catheter replacement at this time, as the patient is not in retention.  I recommended repeat bladder scan tomorrow.  I explained that at that point if she is emptying appropriately, there is no further intervention indicated.  Otherwise, if she is found to have an elevated residual at that point, I would recommend Foley catheter replacement and repeat voiding trial in 1 to 2 weeks.  Patient's daughter is in agreement with this plan.  Results for orders placed or performed in visit on 05/24/20  Bladder Scan (Post Void Residual) in office  Result Value Ref Range   Scan Result 144mL     Return in about 1 day (around 05/25/2020) for Repeat PVR.

## 2020-05-25 ENCOUNTER — Ambulatory Visit: Payer: Self-pay | Admitting: Physician Assistant

## 2020-05-25 ENCOUNTER — Telehealth: Payer: Self-pay

## 2020-05-25 NOTE — Telephone Encounter (Signed)
Spoke with pt daughter, and [facility], [facility] was not under the impression that patient had an appointment today. Patient was scheduled this afternoon for a repeat PVR from yesterday. [facility] has a bladder scanner. As per PA Sam, ok for [facility] to bladder scan pt. [place] will call back with PVR results and further instructions will be given.

## 2020-05-25 NOTE — Telephone Encounter (Signed)
Crystal from [facility] called back with patients pvr 43cc. Crystal states patient has been changed twice and was depends was saturated with urine. They have been pushing fluids. Patient is not having any discomfort and crystal states patient belly is soft . As per Sam have patient follow up as needed. Crystal from [facility] verbalized understanding.

## 2020-08-16 DEATH — deceased

## 2021-11-18 IMAGING — DX DG PORTABLE PELVIS
1 series · 2 of 2 positions shown · non-contrast
Comparison: Radiograph 05/02/2020

CLINICAL DATA: Status post hemiarthroplasty.

EXAM:
PORTABLE PELVIS 1-2 VIEWS

[Series 3: abdomen kub · 0.14mm/px · 2 of 2 slices shown]
[im 1/2]
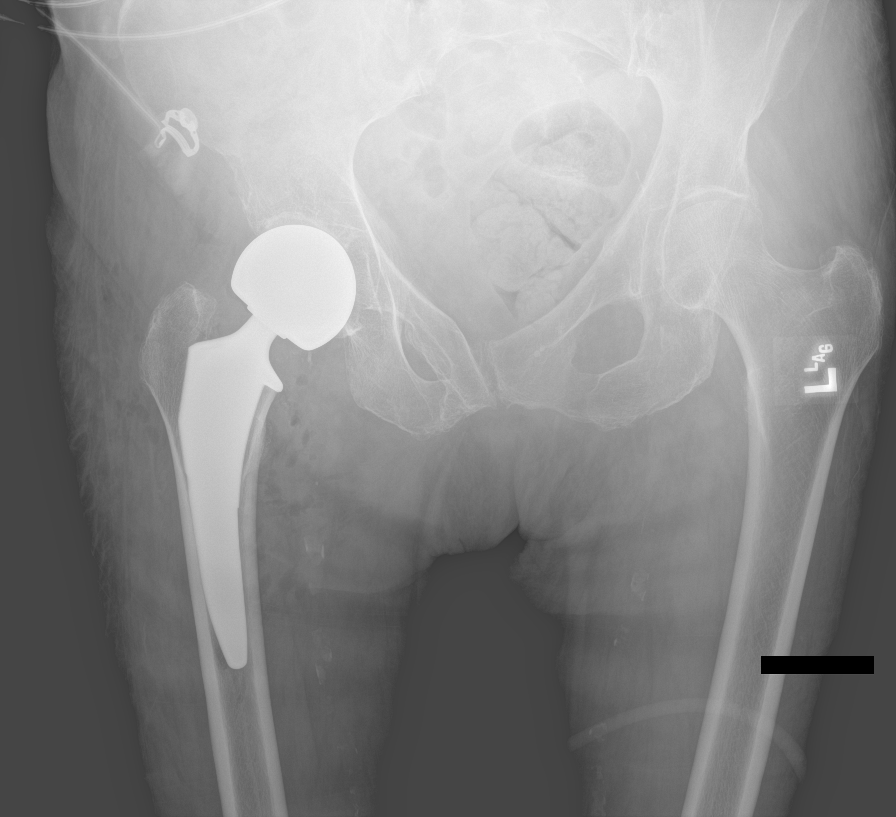
[im 2/2]
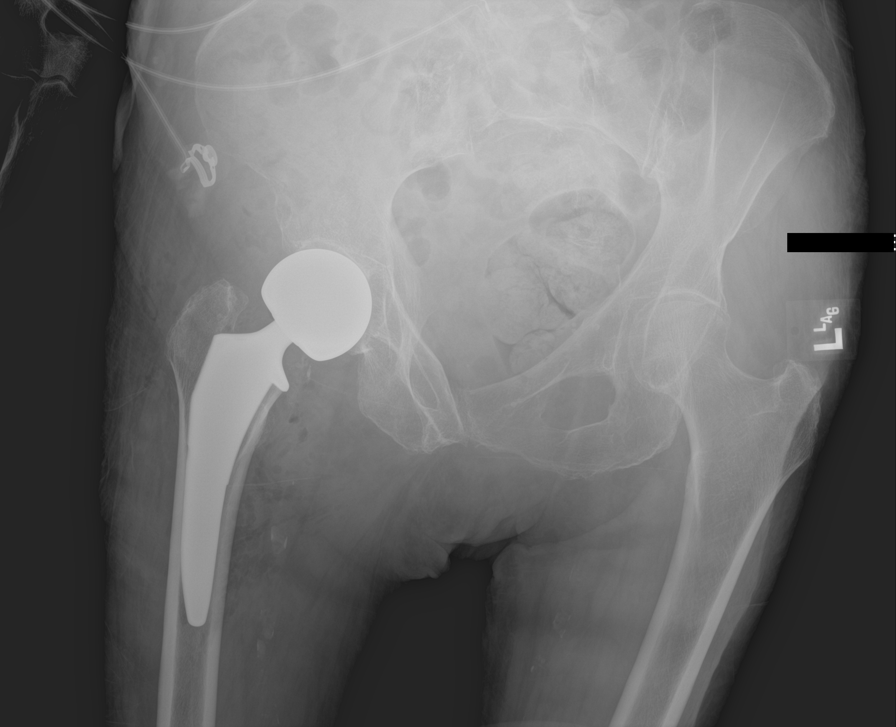

[2 of 2 positions shown; findings below may reference images not displayed]

FINDINGS: Right hemiarthroplasty in expected alignment. There is no
periprosthetic lucency or fracture. Recent postsurgical change
includes air and edema in the soft tissues.
IMPRESSION: Right hemiarthroplasty without immediate postoperative complication.
